# Patient Record
Sex: Male | Born: 1940 | Race: White | Hispanic: No | Smoking: Former smoker
Health system: Southern US, Community
[De-identification: ages and names within clinical notes are randomized; demographics above are authoritative.]

## PROBLEM LIST (undated history)

## (undated) DIAGNOSIS — N400 Enlarged prostate without lower urinary tract symptoms: Secondary | ICD-10-CM

## (undated) DIAGNOSIS — H269 Unspecified cataract: Secondary | ICD-10-CM

## (undated) DIAGNOSIS — R319 Hematuria, unspecified: Secondary | ICD-10-CM

## (undated) DIAGNOSIS — E785 Hyperlipidemia, unspecified: Secondary | ICD-10-CM

## (undated) DIAGNOSIS — C4492 Squamous cell carcinoma of skin, unspecified: Secondary | ICD-10-CM

## (undated) DIAGNOSIS — W57XXXA Bitten or stung by nonvenomous insect and other nonvenomous arthropods, initial encounter: Secondary | ICD-10-CM

## (undated) DIAGNOSIS — K5900 Constipation, unspecified: Secondary | ICD-10-CM

## (undated) DIAGNOSIS — Z0000 Encounter for general adult medical examination without abnormal findings: Secondary | ICD-10-CM

## (undated) DIAGNOSIS — C44310 Basal cell carcinoma of skin of unspecified parts of face: Principal | ICD-10-CM

## (undated) DIAGNOSIS — R972 Elevated prostate specific antigen [PSA]: Secondary | ICD-10-CM

## (undated) HISTORY — DX: Unspecified cataract: H26.9

## (undated) HISTORY — DX: Bitten or stung by nonvenomous insect and other nonvenomous arthropods, initial encounter: W57.XXXA

## (undated) HISTORY — DX: Squamous cell carcinoma of skin, unspecified: C44.92

## (undated) HISTORY — DX: Elevated prostate specific antigen (PSA): R97.20

## (undated) HISTORY — DX: Constipation, unspecified: K59.00

## (undated) HISTORY — DX: Benign prostatic hyperplasia without lower urinary tract symptoms: N40.0

## (undated) HISTORY — DX: Hyperlipidemia, unspecified: E78.5

## (undated) HISTORY — DX: Encounter for general adult medical examination without abnormal findings: Z00.00

## (undated) HISTORY — DX: Hematuria, unspecified: R31.9

## (undated) HISTORY — DX: Basal cell carcinoma of skin of unspecified parts of face: C44.310

---

## 2001-12-11 ENCOUNTER — Encounter: Payer: Self-pay | Admitting: Orthopedic Surgery

## 2001-12-11 ENCOUNTER — Emergency Department (HOSPITAL_COMMUNITY): Admission: EM | Admit: 2001-12-11 | Discharge: 2001-12-11 | Payer: Self-pay | Admitting: Emergency Medicine

## 2005-12-07 DIAGNOSIS — W57XXXA Bitten or stung by nonvenomous insect and other nonvenomous arthropods, initial encounter: Secondary | ICD-10-CM

## 2005-12-07 HISTORY — DX: Bitten or stung by nonvenomous insect and other nonvenomous arthropods, initial encounter: W57.XXXA

## 2012-08-11 ENCOUNTER — Encounter (HOSPITAL_BASED_OUTPATIENT_CLINIC_OR_DEPARTMENT_OTHER): Payer: Self-pay | Admitting: *Deleted

## 2012-08-11 ENCOUNTER — Emergency Department (HOSPITAL_BASED_OUTPATIENT_CLINIC_OR_DEPARTMENT_OTHER)
Admission: EM | Admit: 2012-08-11 | Discharge: 2012-08-11 | Disposition: A | Discharge status: Home / Self Care | Payer: Medicare Other | Attending: Emergency Medicine | Admitting: Emergency Medicine

## 2012-08-11 DIAGNOSIS — Z87891 Personal history of nicotine dependence: Secondary | ICD-10-CM | POA: Insufficient documentation

## 2012-08-11 DIAGNOSIS — R002 Palpitations: Secondary | ICD-10-CM | POA: Insufficient documentation

## 2012-08-11 NOTE — ED Notes (Signed)
Pt declines w/c, amb to room 9 with quick steady gait smiling in nad. Pt states that his pulse rate usually is in the 60's, and over the last several days his home monitor has shown it to be in the low 90's. Pt denies any c/o, states that he is concerned about his pulse rate being higher than usual. ekg performed while pt being triaged.

## 2012-08-11 NOTE — ED Provider Notes (Signed)
History     CSN: [id number]  Arrival date & time 08/11/12  1014   First MD Initiated Contact with Patient 08/11/12 1108      Chief Complaint  Patient presents with  . pulse in 90's last night      HPI Elevated heart rate  Onset - yesterday Course - improving Worsened by - nothing Improved by - nothing Associated symptoms - none  Pt denies cp/sob/headache/weakness/dizziness/syncope/diarrhea.  No diaphoresis reported No new meds (only takes vitamins) No excessive use of coffee, no smoking reported  He reports that he noted his HR was in 90s.  Denies pain.  Denies weakness.  He reports his SBP was 110   He otherwise feels well, no weakness or SOB with exertion  He reports he may have had food poisoning earlier this week after eating a microwaveable meal (he vomited) but that has resolved and he feels improved  PMH -none  History reviewed. No pertinent past surgical history.  History reviewed. No pertinent family history.  History  Substance Use Topics  . Smoking status: Former Games developer  . Smokeless tobacco: Not on file  . Alcohol Use:       Review of Systems  Constitutional: Negative for fever.  Respiratory: Negative for shortness of breath.   Cardiovascular: Negative for chest pain.  Skin: Negative for color change.  Neurological: Negative for weakness.  All other systems reviewed and are negative.    Allergies  Review of patient's allergies indicates no known allergies.  Home Medications   Current Outpatient Rx  Name Route Sig Dispense Refill  . ASPIRIN 81 MG PO TABS Oral Take 81 mg by mouth daily.      BP 120/74  Pulse 79  Temp 98 F (36.7 C) (Oral)  Resp 20  SpO2 99%  Physical Exam CONSTITUTIONAL: Well developed/well nourished HEAD AND FACE: Normocephalic/atraumatic EYES: EOMI/PERRL ENMT: Mucous membranes moist NECK: supple no meningeal signs SPINE:entire spine nontender CV: S1/S2 noted, no murmurs/rubs/gallops noted LUNGS: Lungs are  clear to auscultation bilaterally, no apparent distress ABDOMEN: soft, nontender, no rebound or guarding GU:no cva tenderness NEURO: Pt is awake/alert, moves all extremitiesx4 EXTREMITIES: pulses normal, full ROM SKIN: warm, color normal PSYCH: no abnormalities of mood noted  ED Course  Procedures   1. Palpitation    Pt well appearing, no distress, HR normal here, EKG unremarkable.  I doubt ACS at this time.  He reported recent vomiting, but reports improvement and his abdmen is benign.  Referred to PCP (currently without PCP)   MDM  Nursing notes including past medical history and social history reviewed and considered in documentation        Date: 08/11/2012  Rate: 82  Rhythm: normal sinus rhythm  QRS Axis: normal  Intervals: normal  ST/T Wave abnormalities: nonspecific ST changes  Conduction Disutrbances:none  Narrative Interpretation:   Old EKG Reviewed: none available    Joya Gaskins, MD 08/11/12 1159

## 2012-09-05 ENCOUNTER — Encounter: Payer: Self-pay | Admitting: Internal Medicine

## 2012-09-05 ENCOUNTER — Ambulatory Visit (INDEPENDENT_AMBULATORY_CARE_PROVIDER_SITE_OTHER): Payer: Medicare Other | Admitting: Internal Medicine

## 2012-09-05 VITALS — BP 122/88 | HR 67 | Temp 98.1°F | Resp 16 | Ht 66.0 in | Wt 150.0 lb

## 2012-09-05 DIAGNOSIS — Z125 Encounter for screening for malignant neoplasm of prostate: Secondary | ICD-10-CM

## 2012-09-05 DIAGNOSIS — R634 Abnormal weight loss: Secondary | ICD-10-CM | POA: Insufficient documentation

## 2012-09-05 DIAGNOSIS — M6250 Muscle wasting and atrophy, not elsewhere classified, unspecified site: Secondary | ICD-10-CM

## 2012-09-05 DIAGNOSIS — G8929 Other chronic pain: Secondary | ICD-10-CM | POA: Insufficient documentation

## 2012-09-05 DIAGNOSIS — M549 Dorsalgia, unspecified: Secondary | ICD-10-CM

## 2012-09-05 DIAGNOSIS — M2550 Pain in unspecified joint: Secondary | ICD-10-CM | POA: Insufficient documentation

## 2012-09-05 DIAGNOSIS — Z23 Encounter for immunization: Secondary | ICD-10-CM

## 2012-09-05 LAB — CBC WITH DIFFERENTIAL/PLATELET
Basophils Absolute: 0.1 10*3/uL (ref 0.0–0.1)
Basophils Relative: 1 % (ref 0–1)
Eosinophils Absolute: 0.3 10*3/uL (ref 0.0–0.7)
Eosinophils Relative: 5 % (ref 0–5)
HCT: 43.7 % (ref 39.0–52.0)
Hemoglobin: 14.8 g/dL (ref 13.0–17.0)
Lymphocytes Relative: 22 % (ref 12–46)
Lymphs Abs: 1.4 10*3/uL (ref 0.7–4.0)
MCH: 30.9 pg (ref 26.0–34.0)
MCHC: 33.9 g/dL (ref 30.0–36.0)
MCV: 91.2 fL (ref 78.0–100.0)
Monocytes Absolute: 0.5 10*3/uL (ref 0.1–1.0)
Monocytes Relative: 7 % (ref 3–12)
Neutro Abs: 4.3 10*3/uL (ref 1.7–7.7)
Neutrophils Relative %: 65 % (ref 43–77)
Platelets: 246 10*3/uL (ref 150–400)
RBC: 4.79 MIL/uL (ref 4.22–5.81)
RDW: 13.4 % (ref 11.5–15.5)
WBC: 6.7 10*3/uL (ref 4.0–10.5)

## 2012-09-05 LAB — BASIC METABOLIC PANEL
BUN: 13 mg/dL (ref 6–23)
CO2: 27 mEq/L (ref 19–32)
Calcium: 9.4 mg/dL (ref 8.4–10.5)
Chloride: 102 mEq/L (ref 96–112)
Creat: 0.68 mg/dL (ref 0.50–1.35)
Glucose, Bld: 85 mg/dL (ref 70–99)
Potassium: 4.2 mEq/L (ref 3.5–5.3)
Sodium: 139 mEq/L (ref 135–145)

## 2012-09-05 LAB — HEPATIC FUNCTION PANEL
ALT: 21 U/L (ref 0–53)
AST: 26 U/L (ref 0–37)
Albumin: 4.5 g/dL (ref 3.5–5.2)
Alkaline Phosphatase: 53 U/L (ref 39–117)
Bilirubin, Direct: 0.2 mg/dL (ref 0.0–0.3)
Indirect Bilirubin: 0.7 mg/dL (ref 0.0–0.9)
Total Bilirubin: 0.9 mg/dL (ref 0.3–1.2)
Total Protein: 7 g/dL (ref 6.0–8.3)

## 2012-09-05 LAB — URIC ACID: Uric Acid, Serum: 4.3 mg/dL (ref 4.0–7.8)

## 2012-09-05 NOTE — Assessment & Plan Note (Signed)
Obtain ESR, uric acid, rheumatoid factor
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
## 2012-09-05 NOTE — Assessment & Plan Note (Signed)
Obtain CBC, Chem-7, liver function tests and TSH. Encouraged to reconsider recommendation for colonoscopy. Aware of recommendation at age 71 for colorectal cancer screening.

## 2012-09-06 ENCOUNTER — Encounter: Payer: Self-pay | Admitting: Internal Medicine

## 2012-09-07 MED ORDER — VITAMIN C PO POWD: 333.0000 mg | Freq: Every day | ORAL | Status: AC

## 2012-09-13 ENCOUNTER — Encounter: Payer: Self-pay | Admitting: Internal Medicine

## 2012-09-15 ENCOUNTER — Encounter: Payer: Self-pay | Admitting: *Deleted

## 2012-11-08 ENCOUNTER — Encounter: Payer: Self-pay | Admitting: Internal Medicine

## 2012-11-08 ENCOUNTER — Ambulatory Visit (INDEPENDENT_AMBULATORY_CARE_PROVIDER_SITE_OTHER): Payer: Medicare Other | Admitting: Internal Medicine

## 2012-11-08 VITALS — BP 142/83 | HR 66 | Temp 97.5°F | Resp 16 | Wt 154.8 lb

## 2012-11-08 DIAGNOSIS — R030 Elevated blood-pressure reading, without diagnosis of hypertension: Secondary | ICD-10-CM

## 2012-11-08 DIAGNOSIS — IMO0001 Reserved for inherently not codable concepts without codable children: Secondary | ICD-10-CM

## 2012-11-08 DIAGNOSIS — R634 Abnormal weight loss: Secondary | ICD-10-CM

## 2012-11-08 DIAGNOSIS — I10 Essential (primary) hypertension: Secondary | ICD-10-CM | POA: Insufficient documentation

## 2012-11-08 NOTE — Patient Instructions (Addendum)
Please schedule fasting labs prior to next visit- lipid- chol screening

## 2012-11-08 NOTE — Assessment & Plan Note (Signed)
Result. Continue to monitor. Laboratory evaluation normal.

## 2012-11-08 NOTE — Assessment & Plan Note (Signed)
Recheck was normotensive. May have a phase shift. Recommend outpatient monitoring and schedule followup.
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
## 2013-02-16 ENCOUNTER — Telehealth: Payer: Self-pay

## 2013-02-16 DIAGNOSIS — Z13220 Encounter for screening for lipoid disorders: Secondary | ICD-10-CM

## 2013-02-16 LAB — LIPID PANEL
HDL: 41 mg/dL (ref >39)
LDL Cholesterol: 119 mg/dL — ABNORMAL HIGH (ref 0–99)
Total CHOL/HDL Ratio: 4.2 Ratio
Triglycerides: 64 mg/dL (ref <150)

## 2013-02-16 NOTE — Telephone Encounter (Signed)
Labs ordered.

## 2013-03-07 ENCOUNTER — Ambulatory Visit (INDEPENDENT_AMBULATORY_CARE_PROVIDER_SITE_OTHER): Payer: Medicare Other | Admitting: Family Medicine

## 2013-03-07 ENCOUNTER — Encounter: Payer: Self-pay | Admitting: Family Medicine

## 2013-03-07 ENCOUNTER — Ambulatory Visit (HOSPITAL_BASED_OUTPATIENT_CLINIC_OR_DEPARTMENT_OTHER)
Admission: RE | Admit: 2013-03-07 | Discharge: 2013-03-07 | Disposition: A | Discharge status: Home / Self Care | Payer: Medicare Other | Source: Ambulatory Visit | Attending: Family Medicine | Admitting: Family Medicine

## 2013-03-07 VITALS — BP 146/80 | HR 71 | Temp 97.3°F | Ht 66.0 in | Wt 151.0 lb

## 2013-03-07 DIAGNOSIS — M439 Deforming dorsopathy, unspecified: Secondary | ICD-10-CM | POA: Insufficient documentation

## 2013-03-07 DIAGNOSIS — IMO0001 Reserved for inherently not codable concepts without codable children: Secondary | ICD-10-CM

## 2013-03-07 DIAGNOSIS — G8929 Other chronic pain: Secondary | ICD-10-CM

## 2013-03-07 DIAGNOSIS — R062 Wheezing: Secondary | ICD-10-CM | POA: Insufficient documentation

## 2013-03-07 DIAGNOSIS — S22000D Wedge compression fracture of unspecified thoracic vertebra, subsequent encounter for fracture with routine healing: Secondary | ICD-10-CM

## 2013-03-07 DIAGNOSIS — M2550 Pain in unspecified joint: Secondary | ICD-10-CM

## 2013-03-07 DIAGNOSIS — M549 Dorsalgia, unspecified: Secondary | ICD-10-CM | POA: Insufficient documentation

## 2013-03-07 DIAGNOSIS — R05 Cough: Secondary | ICD-10-CM | POA: Insufficient documentation

## 2013-03-07 DIAGNOSIS — R030 Elevated blood-pressure reading, without diagnosis of hypertension: Secondary | ICD-10-CM

## 2013-03-07 DIAGNOSIS — IMO0002 Reserved for concepts with insufficient information to code with codable children: Secondary | ICD-10-CM

## 2013-03-07 DIAGNOSIS — R059 Cough, unspecified: Secondary | ICD-10-CM | POA: Insufficient documentation

## 2013-03-07 DIAGNOSIS — R634 Abnormal weight loss: Secondary | ICD-10-CM

## 2013-03-07 DIAGNOSIS — K5900 Constipation, unspecified: Secondary | ICD-10-CM

## 2013-03-07 NOTE — Patient Instructions (Addendum)
Digestive Advantage probiotic daily  MegaRed caps krill oil daily Metamucil consider daily   Cholesterol Cholesterol is a white, waxy, fat-like protein needed by your body in small amounts. The liver makes all the cholesterol you need. It is carried from the liver by the blood through the blood vessels. Deposits (plaque) may build up on blood vessel walls. This makes the arteries narrower and stiffer. Plaque increases the risk for heart attack and stroke. You cannot feel your cholesterol level even if it is very high. The only way to know is by a blood test to check your lipid (fats) levels. Once you know your cholesterol levels, you should keep a record of the test results. Work with your caregiver to to keep your levels in the desired range. WHAT THE RESULTS MEAN:  Total cholesterol is a rough measure of all the cholesterol in your blood.  LDL is the so-called bad cholesterol. This is the type that deposits cholesterol in the walls of the arteries. You want this level to be low.  HDL is the good cholesterol because it cleans the arteries and carries the LDL away. You want this level to be high.  Triglycerides are fat that the body can either burn for energy or store. High levels are closely linked to heart disease. DESIRED LEVELS:  Total cholesterol below 200.  LDL below 100 for people at risk, below 70 for very high risk.  HDL above 50 is good, above 60 is best.  Triglycerides below 150. HOW TO LOWER YOUR CHOLESTEROL:  Diet.  Choose fish or white meat chicken and [place], roasted or baked. Limit fatty cuts of red meat, fried foods, and processed meats, such as sausage and lunch meat.  Eat lots of fresh fruits and vegetables. Choose whole grains, beans, pasta, potatoes and cereals.  Use only small amounts of olive, corn or canola oils. Avoid butter, mayonnaise, shortening or palm kernel oils. Avoid foods with trans-fats.  Use skim/nonfat milk and low-fat/nonfat yogurt and  cheeses. Avoid whole milk, cream, ice cream, egg yolks and cheeses. Healthy desserts include angel food cake, ginger snaps, animal crackers, hard candy, popsicles, and low-fat/nonfat frozen yogurt. Avoid pastries, cakes, pies and cookies.  Exercise.  A regular program helps decrease LDL and raises HDL.  Helps with weight control.  Do things that increase your activity level like gardening, walking, or taking the stairs.  Medication.  May be prescribed by your caregiver to help lowering cholesterol and the risk for heart disease.  You may need medicine even if your levels are normal if you have several risk factors. HOME CARE INSTRUCTIONS   Follow your diet and exercise programs as suggested by your caregiver.  Take medications as directed.  Have blood work done when your caregiver feels it is necessary. MAKE SURE YOU:   Understand these instructions.  Will watch your condition.  Will get help right away if you are not doing well or get worse. Document Released: 08/18/2001 Document Revised: 02/15/2012 Document Reviewed: 02/08/2008 [facility] Patient Information 2013 [place], [place].

## 2013-03-08 ENCOUNTER — Encounter: Payer: Self-pay | Admitting: Family Medicine

## 2013-03-08 DIAGNOSIS — K5900 Constipation, unspecified: Secondary | ICD-10-CM

## 2013-03-08 HISTORY — DX: Constipation, unspecified: K59.00
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
## 2013-03-08 NOTE — Assessment & Plan Note (Signed)
Encouraged probiotics, fiber supplement, increased hydration and consider stool softener if no response.

## 2013-03-08 NOTE — Assessment & Plan Note (Signed)
B/l biceps injuries in past, finds his shoulder pain tolerable, adequate ROM no need for further consideration at this time. He will notify us if worsens

## 2013-03-08 NOTE — Assessment & Plan Note (Signed)
Is noting worsening back pain and weakness in legs, xray reveals compression fracture at T11, is referred to neurosurgery for further consideration

## 2013-03-08 NOTE — Assessment & Plan Note (Signed)
Mild elevation, encouraged DASH diet. Continue to monitor

## 2013-03-08 NOTE — Assessment & Plan Note (Signed)
Stable

## 2013-03-09 NOTE — Progress Notes (Signed)
Quick Note:  Patient Informed and voiced understanding ______ 

## 2013-03-17 ENCOUNTER — Encounter: Payer: Self-pay | Admitting: Family Medicine

## 2013-04-11 ENCOUNTER — Other Ambulatory Visit (HOSPITAL_BASED_OUTPATIENT_CLINIC_OR_DEPARTMENT_OTHER): Payer: Self-pay | Admitting: Neurosurgery

## 2013-04-11 DIAGNOSIS — G959 Disease of spinal cord, unspecified: Secondary | ICD-10-CM

## 2013-04-15 ENCOUNTER — Other Ambulatory Visit (HOSPITAL_BASED_OUTPATIENT_CLINIC_OR_DEPARTMENT_OTHER): Payer: Medicare Other

## 2013-04-18 ENCOUNTER — Other Ambulatory Visit (HOSPITAL_BASED_OUTPATIENT_CLINIC_OR_DEPARTMENT_OTHER): Payer: Medicare Other

## 2013-09-12 ENCOUNTER — Ambulatory Visit (INDEPENDENT_AMBULATORY_CARE_PROVIDER_SITE_OTHER): Payer: Medicare Other | Admitting: Family Medicine

## 2013-09-12 ENCOUNTER — Encounter: Payer: Self-pay | Admitting: Family Medicine

## 2013-09-12 VITALS — BP 144/90 | HR 72 | Temp 98.3°F | Ht 66.0 in | Wt 154.0 lb

## 2013-09-12 DIAGNOSIS — R030 Elevated blood-pressure reading, without diagnosis of hypertension: Secondary | ICD-10-CM

## 2013-09-12 DIAGNOSIS — K5900 Constipation, unspecified: Secondary | ICD-10-CM

## 2013-09-12 DIAGNOSIS — R350 Frequency of micturition: Secondary | ICD-10-CM

## 2013-09-12 DIAGNOSIS — R634 Abnormal weight loss: Secondary | ICD-10-CM

## 2013-09-12 DIAGNOSIS — IMO0001 Reserved for inherently not codable concepts without codable children: Secondary | ICD-10-CM

## 2013-09-12 DIAGNOSIS — M549 Dorsalgia, unspecified: Secondary | ICD-10-CM

## 2013-09-12 DIAGNOSIS — G8929 Other chronic pain: Secondary | ICD-10-CM

## 2013-09-12 LAB — CBC
HCT: 43.3 % (ref 39.0–52.0)
Hemoglobin: 15 g/dL (ref 13.0–17.0)
RBC: 4.84 MIL/uL (ref 4.22–5.81)
WBC: 7.1 10*3/uL (ref 4.0–10.5)

## 2013-09-12 NOTE — Patient Instructions (Addendum)
Tylenol/Acetaminophen EX 500 mg 1-2 tabs 3 x daily as needed for pain, try dosing each morning routinely for a few   Salon Pas cream or patches they are good for 12 hours
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
## 2013-09-13 LAB — RENAL FUNCTION PANEL
CO2: 29 mEq/L (ref 19–32)
Calcium: 9.2 mg/dL (ref 8.4–10.5)
Glucose, Bld: 86 mg/dL (ref 70–99)
Potassium: 4.4 mEq/L (ref 3.5–5.3)
Sodium: 139 mEq/L (ref 135–145)

## 2013-09-13 LAB — PSA: PSA: 5.03 ng/mL — ABNORMAL HIGH (ref <=4.00)

## 2013-09-13 LAB — HEPATIC FUNCTION PANEL
AST: 29 U/L (ref 0–37)
Albumin: 4.3 g/dL (ref 3.5–5.2)
Alkaline Phosphatase: 51 U/L (ref 39–117)
Bilirubin, Direct: 0.2 mg/dL (ref 0.0–0.3)
Indirect Bilirubin: 1 mg/dL — ABNORMAL HIGH (ref 0.0–0.9)
Total Bilirubin: 1.2 mg/dL (ref 0.3–1.2)
Total Protein: 6.6 g/dL (ref 6.0–8.3)

## 2013-09-13 LAB — LIPID PANEL
Cholesterol: 182 mg/dL (ref 0–200)
HDL: 41 mg/dL (ref >39)
Total CHOL/HDL Ratio: 4.4 Ratio
VLDL: 24 mg/dL (ref 0–40)

## 2013-09-16 NOTE — Assessment & Plan Note (Signed)
encoruaged probiotics, increased fluids and add a fiber supplement

## 2013-09-16 NOTE — Assessment & Plan Note (Signed)
Improved on recheck minimize sodium and caffeine, recheck at next visit.

## 2013-09-16 NOTE — Assessment & Plan Note (Signed)
Improved today eating better

## 2013-09-16 NOTE — Assessment & Plan Note (Signed)
Has undergone recent evaluaiton and has decided his symptoms are stable and tolerable enough to avoid any major interventions. Will notify us if worsens.

## 2013-09-19 ENCOUNTER — Other Ambulatory Visit: Payer: Self-pay | Admitting: Family Medicine

## 2013-09-19 DIAGNOSIS — R972 Elevated prostate specific antigen [PSA]: Secondary | ICD-10-CM

## 2013-09-19 NOTE — Progress Notes (Signed)
Quick Note:  Patient Informed and voiced understanding.  Pt would like to proceed with referral ______

## 2014-03-13 ENCOUNTER — Ambulatory Visit: Payer: Medicare Other | Admitting: Family Medicine

## 2014-04-12 ENCOUNTER — Ambulatory Visit (INDEPENDENT_AMBULATORY_CARE_PROVIDER_SITE_OTHER): Payer: Medicare HMO | Admitting: Family Medicine

## 2014-04-12 ENCOUNTER — Encounter: Payer: Self-pay | Admitting: Family Medicine

## 2014-04-12 VITALS — BP 150/84 | HR 68 | Temp 97.7°F | Ht 66.0 in | Wt 154.0 lb

## 2014-04-12 DIAGNOSIS — R030 Elevated blood-pressure reading, without diagnosis of hypertension: Secondary | ICD-10-CM

## 2014-04-12 DIAGNOSIS — E785 Hyperlipidemia, unspecified: Secondary | ICD-10-CM

## 2014-04-12 DIAGNOSIS — N400 Enlarged prostate without lower urinary tract symptoms: Secondary | ICD-10-CM

## 2014-04-12 DIAGNOSIS — IMO0001 Reserved for inherently not codable concepts without codable children: Secondary | ICD-10-CM

## 2014-04-12 NOTE — Progress Notes (Signed)
Pre visit review using our clinic review tool, if applicable. No additional management support is needed unless otherwise documented below in the visit note. 

## 2014-04-12 NOTE — Patient Instructions (Signed)
DASH Diet  The DASH diet stands for "Dietary Approaches to Stop Hypertension." It is a healthy eating plan that has been shown to reduce high blood pressure (hypertension) in as little as 14 days, while also possibly providing other significant health benefits. These other health benefits include reducing the risk of breast cancer after menopause and reducing the risk of type 2 diabetes, heart disease, colon cancer, and stroke. Health benefits also include weight loss and slowing kidney failure in patients with chronic kidney disease.   DIET GUIDELINES  · Limit salt (sodium). Your diet should contain less than 1500 mg of sodium daily.  · Limit refined or processed carbohydrates. Your diet should include mostly whole grains. Desserts and added sugars should be used sparingly.  · Include small amounts of heart-healthy fats. These types of fats include nuts, oils, and tub margarine. Limit saturated and trans fats. These fats have been shown to be harmful in the body.  CHOOSING FOODS   The following food groups are based on a 2000 calorie diet. See your Registered Dietitian for individual calorie needs.  Grains and Grain Products (6 to 8 servings daily)  · Eat More Often: Whole-wheat bread, brown rice, whole-grain or wheat pasta, quinoa, popcorn without added fat or salt (air popped).  · Eat Less Often: White bread, white pasta, white rice, cornbread.  Vegetables (4 to 5 servings daily)  · Eat More Often: Fresh, frozen, and canned vegetables. Vegetables may be raw, steamed, roasted, or grilled with a minimal amount of fat.  · Eat Less Often/Avoid: Creamed or fried vegetables. Vegetables in a cheese sauce.  Fruit (4 to 5 servings daily)  · Eat More Often: All fresh, canned (in natural juice), or frozen fruits. Dried fruits without added sugar. One hundred percent fruit juice (½ cup [237 mL] daily).  · Eat Less Often: Dried fruits with added sugar. Canned fruit in light or heavy syrup.  Lean Meats, Fish, and Poultry (2  servings or less daily. One serving is 3 to 4 oz [85-114 g]).  · Eat More Often: Ninety percent or leaner ground beef, tenderloin, sirloin. Round cuts of beef, chicken breast, turkey breast. All fish. Grill, bake, or broil your meat. Nothing should be fried.  · Eat Less Often/Avoid: Fatty cuts of meat, turkey, or chicken leg, thigh, or wing. Fried cuts of meat or fish.  Dairy (2 to 3 servings)  · Eat More Often: Low-fat or fat-free milk, low-fat plain or light yogurt, reduced-fat or part-skim cheese.  · Eat Less Often/Avoid: Milk (whole, 2%). Whole milk yogurt. Full-fat cheeses.  Nuts, Seeds, and Legumes (4 to 5 servings per week)  · Eat More Often: All without added salt.  · Eat Less Often/Avoid: Salted nuts and seeds, canned beans with added salt.  Fats and Sweets (limited)  · Eat More Often: Vegetable oils, tub margarines without trans fats, sugar-free gelatin. Mayonnaise and salad dressings.  · Eat Less Often/Avoid: Coconut oils, palm oils, butter, stick margarine, cream, half and half, cookies, candy, pie.  FOR MORE INFORMATION  The Dash Diet Eating Plan: www.dashdiet.org  Document Released: 11/12/2011 Document Revised: 02/15/2012 Document Reviewed: 11/12/2011  [employer]® Patient Information ©2014 [employer].

## 2014-04-15 ENCOUNTER — Encounter: Payer: Self-pay | Admitting: Family Medicine

## 2014-04-15 DIAGNOSIS — N400 Enlarged prostate without lower urinary tract symptoms: Secondary | ICD-10-CM

## 2014-04-15 DIAGNOSIS — E785 Hyperlipidemia, unspecified: Secondary | ICD-10-CM

## 2014-04-15 HISTORY — DX: Benign prostatic hyperplasia without lower urinary tract symptoms: N40.0

## 2014-04-15 HISTORY — DX: Hyperlipidemia, unspecified: E78.5

## 2014-04-15 NOTE — Progress Notes (Signed)
Patient ID: Troy Mccann, male   DOB: 1941/05/19, 73 y.o.   MRN: [id number] Troy Mccann [id number] May 03, 1941 04/15/2014      Progress Note-Follow Up  Subjective  Chief Complaint  Chief Complaint  Patient presents with  . Follow-up    6 month    HPI  Patient is a 73 year old male in today for routine medical care. His blood pressure at home and his systolic is generally 20. His diastolic is 06T to 01S. He denies any recent illness or concerns. Has recently been seen by urology. No urinary complaints. Denies CP/palp/SOB/HA/congestion/fevers/GI or GU c/o. Taking meds as prescribed  Past Medical History  Diagnosis Date  . Tick bite 2007    Never assessed by a physician.  Marland Kitchen Unspecified constipation 03/08/2013  . BPH (benign prostatic hyperplasia) 04/15/2014  . Other and unspecified hyperlipidemia 04/15/2014    History reviewed. No pertinent past surgical history.  Family History  Problem Relation Age of Onset  . Arthritis Mother     rheumatoid  . Heart disease Mother     rheumatic fever  . Hypertension Mother   . Heart disease Father     MI  . Dementia Father   . Hypertension Sister   . Hypertension Son   . Obesity Son   . Aneurysm Maternal Grandmother   . Dementia Maternal Grandfather   . Heart disease Paternal Grandmother     MI  . Kidney disease Paternal Grandfather     bright's disease    History   Social History  . Marital Status: Divorced    Spouse Name: N/A    Number of Children: N/A  . Years of Education: N/A   Occupational History  . Not on file.   Social History Main Topics  . Smoking status: Former Research scientist (life sciences)  . Smokeless tobacco: Not on file  . Alcohol Use: Yes     Comment: rare  . Drug Use: No  . Sexual Activity: Not on file   Other Topics Concern  . Not on file   Social History Narrative  . No narrative on file    Current Outpatient Prescriptions on File Prior to Visit  Medication Sig Dispense Refill  . Ascorbic Acid (VITAMIN C) POWD  Take 333 mg by mouth daily. Emergen-C-take 1/3 packet daily.      . Camphor-Menthol-Methyl Sal (TIGER BALM MUSCLE RUB EX) Apply topically at bedtime.      . Cholecalciferol (VITAMIN D-3) 1000 UNITS CAPS Take by mouth daily.       No current facility-administered medications on file prior to visit.    Allergies  Allergen Reactions  . Yellow Jacket Venom [Bee Venom] Hives    Review of Systems  Review of Systems  Constitutional: Negative for fever and malaise/fatigue.  HENT: Negative for congestion.   Eyes: Negative for discharge.  Respiratory: Negative for shortness of breath.   Cardiovascular: Negative for chest pain, palpitations and leg swelling.  Gastrointestinal: Negative for nausea, abdominal pain and diarrhea.  Genitourinary: Negative for dysuria.  Musculoskeletal: Negative for falls.  Skin: Negative for rash.  Neurological: Negative for loss of consciousness and headaches.  Endo/Heme/Allergies: Negative for polydipsia.  Psychiatric/Behavioral: Negative for depression and suicidal ideas. The patient is not nervous/anxious and does not have insomnia.     Objective  BP 150/84  Pulse 68  Temp(Src) 97.7 F (36.5 C) (Oral)  Ht 5\' 6"  (1.676 m)  Wt 154 lb (69.854 kg)  BMI 24.87 kg/m2  SpO2 91%  Physical  Exam  Physical Exam  Constitutional: He is oriented to person, place, and time and well-developed, well-nourished, and in no distress. No distress.  HENT:  Head: Normocephalic and atraumatic.  Eyes: Conjunctivae are normal.  Neck: Neck supple. No thyromegaly present.  Cardiovascular: Normal rate, regular rhythm and normal heart sounds.   No murmur heard. Pulmonary/Chest: Effort normal and breath sounds normal. No respiratory distress.  Abdominal: He exhibits no distension and no mass. There is no tenderness.  Musculoskeletal: He exhibits no edema.  Neurological: He is alert and oriented to person, place, and time.  Skin: Skin is warm.  Psychiatric: Memory, affect  and judgment normal.    Lab Results  Component Value Date   TSH 2.062 09/12/2013   Lab Results  Component Value Date   WBC 7.1 09/12/2013   HGB 15.0 09/12/2013   HCT 43.3 09/12/2013   MCV 89.5 09/12/2013   PLT 236 09/12/2013   Lab Results  Component Value Date   CREATININE 0.74 09/12/2013   BUN 17 09/12/2013   NA 139 09/12/2013   K 4.4 09/12/2013   CL 104 09/12/2013   CO2 29 09/12/2013   Lab Results  Component Value Date   ALT 25 09/12/2013   AST 29 09/12/2013   ALKPHOS 51 09/12/2013   BILITOT 1.2 09/12/2013   Lab Results  Component Value Date   CHOL 182 09/12/2013   Lab Results  Component Value Date   HDL 41 09/12/2013   Lab Results  Component Value Date   LDLCALC 117* 09/12/2013   Lab Results  Component Value Date   TRIG 122 09/12/2013   Lab Results  Component Value Date   CHOLHDL 4.4 09/12/2013     Assessment & Plan  Elevated blood pressure Adequate control elsewhere and improved on recheckno changes to meds. Encouraged heart healthy diet such as the DASH diet and exercise as tolerated.   BPH (benign prostatic hyperplasia) Asymptomatic at present is following with urology  Other and unspecified hyperlipidemia Encouraged heart healthy diet, increase exercise, avoid trans fats, consider a krill oil cap daily

## 2014-04-15 NOTE — Assessment & Plan Note (Signed)
Encouraged heart healthy diet, increase exercise, avoid trans fats, consider a krill oil cap daily 

## 2014-04-15 NOTE — Assessment & Plan Note (Signed)
Asymptomatic at present is following with urology

## 2014-04-15 NOTE — Assessment & Plan Note (Addendum)
Adequate control elsewhere and improved on recheckno changes to meds. Encouraged heart healthy diet such as the DASH diet and exercise as tolerated.

## 2014-10-15 ENCOUNTER — Encounter: Payer: Self-pay | Admitting: Family Medicine

## 2014-10-15 ENCOUNTER — Ambulatory Visit (INDEPENDENT_AMBULATORY_CARE_PROVIDER_SITE_OTHER): Payer: Medicare HMO | Admitting: Family Medicine

## 2014-10-15 VITALS — BP 124/78 | HR 67 | Temp 97.8°F | Ht 66.0 in | Wt 153.2 lb

## 2014-10-15 DIAGNOSIS — C4492 Squamous cell carcinoma of skin, unspecified: Secondary | ICD-10-CM

## 2014-10-15 DIAGNOSIS — Z23 Encounter for immunization: Secondary | ICD-10-CM

## 2014-10-15 DIAGNOSIS — IMO0001 Reserved for inherently not codable concepts without codable children: Secondary | ICD-10-CM

## 2014-10-15 DIAGNOSIS — R972 Elevated prostate specific antigen [PSA]: Secondary | ICD-10-CM

## 2014-10-15 DIAGNOSIS — K5900 Constipation, unspecified: Secondary | ICD-10-CM

## 2014-10-15 DIAGNOSIS — H547 Unspecified visual loss: Secondary | ICD-10-CM

## 2014-10-15 DIAGNOSIS — N400 Enlarged prostate without lower urinary tract symptoms: Secondary | ICD-10-CM

## 2014-10-15 DIAGNOSIS — R030 Elevated blood-pressure reading, without diagnosis of hypertension: Secondary | ICD-10-CM

## 2014-10-15 DIAGNOSIS — Z0000 Encounter for general adult medical examination without abnormal findings: Secondary | ICD-10-CM

## 2014-10-15 DIAGNOSIS — E785 Hyperlipidemia, unspecified: Secondary | ICD-10-CM

## 2014-10-15 LAB — LIPID PANEL
Cholesterol: 212 mg/dL — ABNORMAL HIGH (ref 0–200)
HDL: 38.9 mg/dL — ABNORMAL LOW (ref >39.00)
LDL Cholesterol: 159 mg/dL — ABNORMAL HIGH (ref 0–99)
NONHDL: 173.1
Total CHOL/HDL Ratio: 5
Triglycerides: 71 mg/dL (ref 0.0–149.0)
VLDL: 14.2 mg/dL (ref 0.0–40.0)

## 2014-10-15 LAB — RENAL FUNCTION PANEL
Albumin: 3.7 g/dL (ref 3.5–5.2)
BUN: 14 mg/dL (ref 6–23)
CALCIUM: 9.1 mg/dL (ref 8.4–10.5)
CO2: 24 meq/L (ref 19–32)
Chloride: 105 mEq/L (ref 96–112)
Creatinine, Ser: 0.7 mg/dL (ref 0.4–1.5)
GFR: 111.11 mL/min (ref >60.00)
Glucose, Bld: 95 mg/dL (ref 70–99)
Phosphorus: 3.1 mg/dL (ref 2.3–4.6)
Potassium: 4.2 mEq/L (ref 3.5–5.1)
SODIUM: 140 meq/L (ref 135–145)

## 2014-10-15 LAB — HEPATIC FUNCTION PANEL
ALT: 30 U/L (ref 0–53)
AST: 33 U/L (ref 0–37)
Albumin: 3.7 g/dL (ref 3.5–5.2)
Alkaline Phosphatase: 45 U/L (ref 39–117)
BILIRUBIN TOTAL: 1.5 mg/dL — AB (ref 0.2–1.2)
Bilirubin, Direct: 0.2 mg/dL (ref 0.0–0.3)
TOTAL PROTEIN: 6.7 g/dL (ref 6.0–8.3)

## 2014-10-15 LAB — CBC
HCT: 44.9 % (ref 39.0–52.0)
HEMOGLOBIN: 14.8 g/dL (ref 13.0–17.0)
MCHC: 32.9 g/dL (ref 30.0–36.0)
MCV: 92.7 fl (ref 78.0–100.0)
Platelets: 205 10*3/uL (ref 150.0–400.0)
RBC: 4.84 Mil/uL (ref 4.22–5.81)
RDW: 13.2 % (ref 11.5–15.5)
WBC: 6.2 10*3/uL (ref 4.0–10.5)

## 2014-10-15 LAB — PSA, MEDICARE: PSA: 4.62 ng/ml — ABNORMAL HIGH (ref 0.10–4.00)

## 2014-10-15 LAB — TSH: TSH: 1.85 u[IU]/mL (ref 0.35–4.50)

## 2014-10-15 NOTE — Patient Instructions (Signed)
Consider an audiology exam at [facility] for hearing evaluation   Preventive Care for Adults A healthy lifestyle and preventive care can promote health and wellness. Preventive health guidelines for men include the following key practices:  A routine yearly physical is a good way to check with your health care provider about your health and preventative screening. It is a chance to share any concerns and updates on your health and to receive a thorough exam.  Visit your dentist for a routine exam and preventative care every 6 months. Brush your teeth twice a day and floss once a day. Good oral hygiene prevents tooth decay and gum disease.  The frequency of eye exams is based on your age, health, family medical history, use of contact lenses, and other factors. Follow your health care provider's recommendations for frequency of eye exams.  Eat a healthy diet. Foods such as vegetables, fruits, whole grains, low-fat dairy products, and lean protein foods contain the nutrients you need without too many calories. Decrease your intake of foods high in solid fats, added sugars, and salt. Eat the right amount of calories for you.Get information about a proper diet from your health care provider, if necessary.  Regular physical exercise is one of the most important things you can do for your health. Most adults should get at least 150 minutes of moderate-intensity exercise (any activity that increases your heart rate and causes you to sweat) each week. In addition, most adults need muscle-strengthening exercises on 2 or more days a week.  Maintain a healthy weight. The body mass index (BMI) is a screening tool to identify possible weight problems. It provides an estimate of body fat based on height and weight. Your health care provider can find your BMI and can help you achieve or maintain a healthy weight.For adults 20 years and older:  A BMI below 18.5 is considered underweight.  A BMI of 18.5 to 24.9 is  normal.  A BMI of 25 to 29.9 is considered overweight.  A BMI of 30 and above is considered obese.  Maintain normal blood lipids and cholesterol levels by exercising and minimizing your intake of saturated fat. Eat a balanced diet with plenty of fruit and vegetables. Blood tests for lipids and cholesterol should begin at age 11 and be repeated every 5 years. If your lipid or cholesterol levels are high, you are over 50, or you are at high risk for heart disease, you may need your cholesterol levels checked more frequently.Ongoing high lipid and cholesterol levels should be treated with medicines if diet and exercise are not working.  If you smoke, find out from your health care provider how to quit. If you do not use tobacco, do not start.  Lung cancer screening is recommended for adults aged 69-80 years who are at high risk for developing lung cancer because of a history of smoking. A yearly low-dose CT scan of the lungs is recommended for people who have at least a 30-pack-year history of smoking and are a current smoker or have quit within the past 15 years. A pack year of smoking is smoking an average of 1 pack of cigarettes a day for 1 year (for example: 1 pack a day for 30 years or 2 packs a day for 15 years). Yearly screening should continue until the smoker has stopped smoking for at least 15 years. Yearly screening should be stopped for people who develop a health problem that would prevent them from having lung cancer treatment.  If you choose to drink alcohol, do not have more than 2 drinks per day. One drink is considered to be 12 ounces (355 mL) of beer, 5 ounces (148 mL) of wine, or 1.5 ounces (44 mL) of liquor.  Avoid use of street drugs. Do not share needles with anyone. Ask for help if you need support or instructions about stopping the use of drugs.  High blood pressure causes heart disease and increases the risk of stroke. Your blood pressure should be checked at least every 1-2  years. Ongoing high blood pressure should be treated with medicines, if weight loss and exercise are not effective.  If you are 20-35 years old, ask your health care provider if you should take aspirin to prevent heart disease.  Diabetes screening involves taking a blood sample to check your fasting blood sugar level. This should be done once every 3 years, after age 78, if you are within normal weight and without risk factors for diabetes. Testing should be considered at a younger age or be carried out more frequently if you are overweight and have at least 1 risk factor for diabetes.  Colorectal cancer can be detected and often prevented. Most routine colorectal cancer screening begins at the age of 4 and continues through age 60. However, your health care provider may recommend screening at an earlier age if you have risk factors for colon cancer. On a yearly basis, your health care provider may provide home test kits to check for hidden blood in the stool. Use of a small camera at the end of a tube to directly examine the colon (sigmoidoscopy or colonoscopy) can detect the earliest forms of colorectal cancer. Talk to your health care provider about this at age 36, when routine screening begins. Direct exam of the colon should be repeated every 5-10 years through age 55, unless early forms of precancerous polyps or small growths are found.  People who are at an increased risk for hepatitis B should be screened for this virus. You are considered at high risk for hepatitis B if:  You were born in a country where hepatitis B occurs often. Talk with your health care provider about which countries are considered high risk.  Your parents were born in a high-risk country and you have not received a shot to protect against hepatitis B (hepatitis B vaccine).  You have HIV or AIDS.  You use needles to inject street drugs.  You live with, or have sex with, someone who has hepatitis B.  You are a man who  has sex with other men (MSM).  You get hemodialysis treatment.  You take certain medicines for conditions such as cancer, organ transplantation, and autoimmune conditions.  Hepatitis C blood testing is recommended for all people born from [birth year] through 1965 and any individual with known risks for hepatitis C.  Practice safe sex. Use condoms and avoid high-risk sexual practices to reduce the spread of sexually transmitted infections (STIs). STIs include gonorrhea, chlamydia, syphilis, trichomonas, herpes, HPV, and human immunodeficiency virus (HIV). Herpes, HIV, and HPV are viral illnesses that have no cure. They can result in disability, cancer, and death.  If you are at risk of being infected with HIV, it is recommended that you take a prescription medicine daily to prevent HIV infection. This is called preexposure prophylaxis (PrEP). You are considered at risk if:  You are a man who has sex with other men (MSM) and have other risk factors.  You are a heterosexual man,  are sexually active, and are at increased risk for HIV infection.  You take drugs by injection.  You are sexually active with a partner who has HIV.  Talk with your health care provider about whether you are at high risk of being infected with HIV. If you choose to begin PrEP, you should first be tested for HIV. You should then be tested every 3 months for as long as you are taking PrEP.  A one-time screening for abdominal aortic aneurysm (AAA) and surgical repair of large AAAs by ultrasound are recommended for men ages 85 to 72 years who are current or former smokers.  Healthy men should no longer receive prostate-specific antigen (PSA) blood tests as part of routine cancer screening. Talk with your health care provider about prostate cancer screening.  Testicular cancer screening is not recommended for adult males who have no symptoms. Screening includes self-exam, a health care provider exam, and other screening tests.  Consult with your health care provider about any symptoms you have or any concerns you have about testicular cancer.  Use sunscreen. Apply sunscreen liberally and repeatedly throughout the day. You should seek shade when your shadow is shorter than you. Protect yourself by wearing long sleeves, pants, a wide-brimmed hat, and sunglasses year round, whenever you are outdoors.  Once a month, do a whole-body skin exam, using a mirror to look at the skin on your back. Tell your health care provider about new moles, moles that have irregular borders, moles that are larger than a pencil eraser, or moles that have changed in shape or color.  Stay current with required vaccines (immunizations).  Influenza vaccine. All adults should be immunized every year.  Tetanus, diphtheria, and acellular pertussis (Td, Tdap) vaccine. An adult who has not previously received Tdap or who does not know his vaccine status should receive 1 dose of Tdap. This initial dose should be followed by tetanus and diphtheria toxoids (Td) booster doses every 10 years. Adults with an unknown or incomplete history of completing a 3-dose immunization series with Td-containing vaccines should begin or complete a primary immunization series including a Tdap dose. Adults should receive a Td booster every 10 years.  Varicella vaccine. An adult without evidence of immunity to varicella should receive 2 doses or a second dose if he has previously received 1 dose.  Human papillomavirus (HPV) vaccine. Males aged 76-21 years who have not received the vaccine previously should receive the 3-dose series. Males aged 22-26 years may be immunized. Immunization is recommended through the age of 87 years for any male who has sex with males and did not get any or all doses earlier. Immunization is recommended for any person with an immunocompromised condition through the age of 21 years if he did not get any or all doses earlier. During the 3-dose series, the  second dose should be obtained 4-8 weeks after the first dose. The third dose should be obtained 24 weeks after the first dose and 16 weeks after the second dose.  Zoster vaccine. One dose is recommended for adults aged 36 years or older unless certain conditions are present.  Measles, mumps, and rubella (MMR) vaccine. Adults born before [birth year] generally are considered immune to measles and mumps. Adults born in [birth year] or later should have 1 or more doses of MMR vaccine unless there is a contraindication to the vaccine or there is laboratory evidence of immunity to each of the three diseases. A routine second dose of MMR vaccine should be obtained  at least 28 days after the first dose for students attending postsecondary schools, health care workers, or international travelers. People who received inactivated measles vaccine or an unknown type of measles vaccine during 1963-1967 should receive 2 doses of MMR vaccine. People who received inactivated mumps vaccine or an unknown type of mumps vaccine before 1979 and are at high risk for mumps infection should consider immunization with 2 doses of MMR vaccine. Unvaccinated health care workers born before [birth year] who lack laboratory evidence of measles, mumps, or rubella immunity or laboratory confirmation of disease should consider measles and mumps immunization with 2 doses of MMR vaccine or rubella immunization with 1 dose of MMR vaccine.  Pneumococcal 13-valent conjugate (PCV13) vaccine. When indicated, a person who is uncertain of his immunization history and has no record of immunization should receive the PCV13 vaccine. An adult aged 62 years or older who has certain medical conditions and has not been previously immunized should receive 1 dose of PCV13 vaccine. This PCV13 should be followed with a dose of pneumococcal polysaccharide (PPSV23) vaccine. The PPSV23 vaccine dose should be obtained at least 8 weeks after the dose of PCV13 vaccine. An adult aged 61 years  or older who has certain medical conditions and previously received 1 or more doses of PPSV23 vaccine should receive 1 dose of PCV13. The PCV13 vaccine dose should be obtained 1 or more years after the last PPSV23 vaccine dose.  Pneumococcal polysaccharide (PPSV23) vaccine. When PCV13 is also indicated, PCV13 should be obtained first. All adults aged 9 years and older should be immunized. An adult younger than age 49 years who has certain medical conditions should be immunized. Any person who resides in a nursing home or long-term care facility should be immunized. An adult smoker should be immunized. People with an immunocompromised condition and certain other conditions should receive both PCV13 and PPSV23 vaccines. People with human immunodeficiency virus (HIV) infection should be immunized as soon as possible after diagnosis. Immunization during chemotherapy or radiation therapy should be avoided. Routine use of PPSV23 vaccine is not recommended for American Indians, [place] Natives, or people younger than 65 years unless there are medical conditions that require PPSV23 vaccine. When indicated, people who have unknown immunization and have no record of immunization should receive PPSV23 vaccine. One-time revaccination 5 years after the first dose of PPSV23 is recommended for people aged 19-64 years who have chronic kidney failure, nephrotic syndrome, asplenia, or immunocompromised conditions. People who received 1-2 doses of PPSV23 before age 26 years should receive another dose of PPSV23 vaccine at age 39 years or later if at least 5 years have passed since the previous dose. Doses of PPSV23 are not needed for people immunized with PPSV23 at or after age 36 years.  Meningococcal vaccine. Adults with asplenia or persistent complement component deficiencies should receive 2 doses of quadrivalent meningococcal conjugate (MenACWY-D) vaccine. The doses should be obtained at least 2 months apart. Microbiologists  working with certain meningococcal bacteria, [employer] recruits, people at risk during an outbreak, and people who travel to or live in countries with a high rate of meningitis should be immunized. A first-year college student up through age 70 years who is living in a residence hall should receive a dose if he did not receive a dose on or after his 16th birthday. Adults who have certain high-risk conditions should receive one or more doses of vaccine.  Hepatitis A vaccine. Adults who wish to be protected from this disease, have certain high-risk conditions,  work with hepatitis A-infected animals, work in hepatitis A research labs, or travel to or work in countries with a high rate of hepatitis A should be immunized. Adults who were previously unvaccinated and who anticipate close contact with an international adoptee during the first 60 days after arrival in the [place] from a country with a high rate of hepatitis A should be immunized.  Hepatitis B vaccine. Adults should be immunized if they wish to be protected from this disease, have certain high-risk conditions, may be exposed to blood or other infectious body fluids, are household contacts or sex partners of hepatitis B positive people, are clients or workers in certain care facilities, or travel to or work in countries with a high rate of hepatitis B.  Haemophilus influenzae type b (Hib) vaccine. A previously unvaccinated person with asplenia or sickle cell disease or having a scheduled splenectomy should receive 1 dose of Hib vaccine. Regardless of previous immunization, a recipient of a hematopoietic stem cell transplant should receive a 3-dose series 6-12 months after his successful transplant. Hib vaccine is not recommended for adults with HIV infection. Preventive Service / Frequency Ages 25 to 65  Blood pressure check.** / Every 1 to 2 years.  Lipid and cholesterol check.** / Every 5 years beginning at age 84.  Hepatitis C blood  test.** / For any individual with known risks for hepatitis C.  Skin self-exam. / Monthly.  Influenza vaccine. / Every year.  Tetanus, diphtheria, and acellular pertussis (Tdap, Td) vaccine.** / Consult your health care provider. 1 dose of Td every 10 years.  Varicella vaccine.** / Consult your health care provider.  HPV vaccine. / 3 doses over 6 months, if 48 or younger.  Measles, mumps, rubella (MMR) vaccine.** / You need at least 1 dose of MMR if you were born in 1957 or later. You may also need a second dose.  Pneumococcal 13-valent conjugate (PCV13) vaccine.** / Consult your health care provider.  Pneumococcal polysaccharide (PPSV23) vaccine.** / 1 to 2 doses if you smoke cigarettes or if you have certain conditions.  Meningococcal vaccine.** / 1 dose if you are age 77 to 71 years and a Market researcher living in a residence hall, or have one of several medical conditions. You may also need additional booster doses.  Hepatitis A vaccine.** / Consult your health care provider.  Hepatitis B vaccine.** / Consult your health care provider.  Haemophilus influenzae type b (Hib) vaccine.** / Consult your health care provider. Ages 46 to 75  Blood pressure check.** / Every 1 to 2 years.  Lipid and cholesterol check.** / Every 5 years beginning at age 80.  Lung cancer screening. / Every year if you are aged 48-80 years and have a 30-pack-year history of smoking and currently smoke or have quit within the past 15 years. Yearly screening is stopped once you have quit smoking for at least 15 years or develop a health problem that would prevent you from having lung cancer treatment.  Fecal occult blood test (FOBT) of stool. / Every year beginning at age 77 and continuing until age 18. You may not have to do this test if you get a colonoscopy every 10 years.  Flexible sigmoidoscopy** or colonoscopy.** / Every 5 years for a flexible sigmoidoscopy or every 10 years for a colonoscopy  beginning at age 87 and continuing until age 82.  Hepatitis C blood test.** / For all people born from [birth year] through 1967 and any individual with known risks for  hepatitis C.  Skin self-exam. / Monthly.  Influenza vaccine. / Every year.  Tetanus, diphtheria, and acellular pertussis (Tdap/Td) vaccine.** / Consult your health care provider. 1 dose of Td every 10 years.  Varicella vaccine.** / Consult your health care provider.  Zoster vaccine.** / 1 dose for adults aged 42 years or older.  Measles, mumps, rubella (MMR) vaccine.** / You need at least 1 dose of MMR if you were born in 1959 or later. You may also need a second dose.  Pneumococcal 13-valent conjugate (PCV13) vaccine.** / Consult your health care provider.  Pneumococcal polysaccharide (PPSV23) vaccine.** / 1 to 2 doses if you smoke cigarettes or if you have certain conditions.  Meningococcal vaccine.** / Consult your health care provider.  Hepatitis A vaccine.** / Consult your health care provider.  Hepatitis B vaccine.** / Consult your health care provider.  Haemophilus influenzae type b (Hib) vaccine.** / Consult your health care provider. Ages 71 and over  Blood pressure check.** / Every 1 to 2 years.  Lipid and cholesterol check.**/ Every 5 years beginning at age 56.  Lung cancer screening. / Every year if you are aged 63-80 years and have a 30-pack-year history of smoking and currently smoke or have quit within the past 15 years. Yearly screening is stopped once you have quit smoking for at least 15 years or develop a health problem that would prevent you from having lung cancer treatment.  Fecal occult blood test (FOBT) of stool. / Every year beginning at age 59 and continuing until age 49. You may not have to do this test if you get a colonoscopy every 10 years.  Flexible sigmoidoscopy** or colonoscopy.** / Every 5 years for a flexible sigmoidoscopy or every 10 years for a colonoscopy beginning at age 85 and  continuing until age 25.  Hepatitis C blood test.** / For all people born from [birth year] through 1967 and any individual with known risks for hepatitis C.  Abdominal aortic aneurysm (AAA) screening.** / A one-time screening for ages 76 to 30 years who are current or former smokers.  Skin self-exam. / Monthly.  Influenza vaccine. / Every year.  Tetanus, diphtheria, and acellular pertussis (Tdap/Td) vaccine.** / 1 dose of Td every 10 years.  Varicella vaccine.** / Consult your health care provider.  Zoster vaccine.** / 1 dose for adults aged 43 years or older.  Pneumococcal 13-valent conjugate (PCV13) vaccine.** / Consult your health care provider.  Pneumococcal polysaccharide (PPSV23) vaccine.** / 1 dose for all adults aged 42 years and older.  Meningococcal vaccine.** / Consult your health care provider.  Hepatitis A vaccine.** / Consult your health care provider.  Hepatitis B vaccine.** / Consult your health care provider.  Haemophilus influenzae type b (Hib) vaccine.** / Consult your health care provider. **Family history and personal history of risk and conditions may change your health care provider's recommendations. Document Released: 01/19/2002 Document Revised: 11/28/2013 Document Reviewed: 04/20/2011 [facility] Patient Information 2015 [place], [place]. This information is not intended to replace advice given to you by your health care provider. Make sure you discuss any questions you have with your health care provider.

## 2014-10-15 NOTE — Progress Notes (Signed)
Pre visit review using our clinic review tool, if applicable. No additional management support is needed unless otherwise documented below in the visit note. 

## 2014-10-21 ENCOUNTER — Encounter: Payer: Self-pay | Admitting: Family Medicine

## 2014-10-21 DIAGNOSIS — Z0000 Encounter for general adult medical examination without abnormal findings: Secondary | ICD-10-CM

## 2014-10-21 DIAGNOSIS — C4492 Squamous cell carcinoma of skin, unspecified: Secondary | ICD-10-CM

## 2014-10-21 HISTORY — DX: Encounter for general adult medical examination without abnormal findings: Z00.00

## 2014-10-21 HISTORY — DX: Squamous cell carcinoma of skin, unspecified: C44.92

## 2014-10-21 NOTE — Assessment & Plan Note (Signed)
Encouraged heart healthy diet, increase exercise, avoid trans fats, consider a krill oil cap daily 

## 2014-10-21 NOTE — Progress Notes (Signed)
Troy Mccann  [id number] 1941-10-12 10/21/2014      Progress Note-Follow Up  Subjective  Chief Complaint  Chief Complaint  Patient presents with  . Annual Exam    medicare wellness  . Injections    prevnar    HPI  Patient is a 73 y.o. male in today for routine medical care. He is in today for annual exam. No recent illness. He feels well. He feels confident the probiotics have improved his constipation. He was seen by urology for elevated PSA. No new urinary symptoms. Last PSA was 3.88 per patient. He continues to stay active and eat a heart healthy diet. Denies CP/palp/SOB/HA/congestion/fevers/GI or GU c/o. Taking meds as prescribed  Past Medical History  Diagnosis Date  . Tick bite 2007    Never assessed by a physician.  Marland Kitchen Unspecified constipation 03/08/2013  . BPH (benign prostatic hyperplasia) 04/15/2014  . Other and unspecified hyperlipidemia 04/15/2014  . Medicare annual wellness visit, subsequent 10/21/2014  . SCC (squamous cell carcinoma) 10/21/2014    History reviewed. No pertinent past surgical history.  Family History  Problem Relation Age of Onset  . Arthritis Mother     rheumatoid  . Heart disease Mother     rheumatic fever  . Hypertension Mother   . Heart disease Father     MI  . Dementia Father   . Hypertension Sister   . Hypertension Son   . Obesity Son   . Aneurysm Maternal Grandmother   . Dementia Maternal Grandfather   . Heart disease Paternal Grandmother     MI  . Kidney disease Paternal Grandfather     bright's disease    History   Social History  . Marital Status: Divorced    Spouse Name: N/A    Number of Children: N/A  . Years of Education: N/A   Occupational History  . Not on file.   Social History Main Topics  . Smoking status: Former Research scientist (life sciences)  . Smokeless tobacco: Not on file  . Alcohol Use: Yes     Comment: rare  . Drug Use: No  . Sexual Activity: Not on file   Other Topics Concern  . Not on file   Social History  Narrative    Current Outpatient Prescriptions on File Prior to Visit  Medication Sig Dispense Refill  . Ascorbic Acid (VITAMIN C) POWD Take 333 mg by mouth daily. Emergen-C-take 1/3 packet daily.    . Camphor-Menthol-Methyl Sal (TIGER BALM MUSCLE RUB EX) Apply topically at bedtime.    . Cholecalciferol (VITAMIN D-3) 1000 UNITS CAPS Take by mouth daily.     No current facility-administered medications on file prior to visit.    Allergies  Allergen Reactions  . Yellow Jacket Venom [Bee Venom] Hives    Review of Systems  Review of Systems  Constitutional: Negative for fever, chills and malaise/fatigue.  HENT: Negative for congestion, hearing loss and nosebleeds.   Eyes: Negative for discharge.  Respiratory: Negative for cough, sputum production, shortness of breath and wheezing.   Cardiovascular: Negative for chest pain, palpitations and leg swelling.  Gastrointestinal: Negative for heartburn, nausea, vomiting, abdominal pain, diarrhea, constipation and blood in stool.  Genitourinary: Negative for dysuria, urgency, frequency and hematuria.  Musculoskeletal: Negative for myalgias, back pain and falls.  Skin: Negative for rash.  Neurological: Negative for dizziness, tremors, sensory change, focal weakness, loss of consciousness, weakness and headaches.  Endo/Heme/Allergies: Negative for polydipsia. Does not bruise/bleed easily.  Psychiatric/Behavioral: Negative for depression and suicidal ideas.  The patient is not nervous/anxious and does not have insomnia.     Objective  BP 124/78 mmHg  Pulse 67  Temp(Src) 97.8 F (36.6 C) (Oral)  Ht 5\' 6"  (1.676 m)  Wt 153 lb 3.2 oz (69.491 kg)  BMI 24.74 kg/m2  SpO2 97%  Physical Exam  Physical Exam  Lab Results  Component Value Date   TSH 1.85 10/15/2014   Lab Results  Component Value Date   WBC 6.2 10/15/2014   HGB 14.8 10/15/2014   HCT 44.9 10/15/2014   MCV 92.7 10/15/2014   PLT 205.0 10/15/2014   Lab Results  Component  Value Date   CREATININE 0.7 10/15/2014   BUN 14 10/15/2014   NA 140 10/15/2014   K 4.2 10/15/2014   CL 105 10/15/2014   CO2 24 10/15/2014   Lab Results  Component Value Date   ALT 30 10/15/2014   AST 33 10/15/2014   ALKPHOS 45 10/15/2014   BILITOT 1.5* 10/15/2014   Lab Results  Component Value Date   CHOL 212* 10/15/2014   Lab Results  Component Value Date   HDL 38.90* 10/15/2014   Lab Results  Component Value Date   LDLCALC 159* 10/15/2014   Lab Results  Component Value Date   TRIG 71.0 10/15/2014   Lab Results  Component Value Date   CHOLHDL 5 10/15/2014     Assessment & Plan  Constipation Encouraged increased hydration and fiber in diet. Daily probiotics. If bowels not moving can use MOM 2 tbls po in 4 oz of warm prune juice by mouth every 2-3 days. If no results then repeat in 4 hours with  Dulcolax suppository pr, may repeat again in 4 more hours as needed. Seek care if symptoms worsen. Consider daily Miralax and/or Dulcolax if symptoms persist.   Elevated blood pressure Well controlled. Encouraged heart healthy diet such as the DASH diet and exercise as tolerated.   Hyperlipidemia Encouraged heart healthy diet, increase exercise, avoid trans fats, consider a krill oil cap daily  Medicare annual wellness visit, subsequent Patient denies any difficulties at home. No trouble with ADLs, depression or falls. No recent changes to vision or hearing. Is UTD with immunizations. Is UTD with screening. Discussed Advanced Directives, patient agrees to bring [place] copies of documents if can. Encouraged heart healthy diet, exercise as tolerated and adequate sleep. Reviewed labs today. Is following with urology, Dr Risa Grill, proceed with IFOB. Given to patient today.

## 2014-10-21 NOTE — Assessment & Plan Note (Signed)
Patient denies any difficulties at home. No trouble with ADLs, depression or falls. No recent changes to vision or hearing. Is UTD with immunizations. Is UTD with screening. Discussed Advanced Directives, patient agrees to bring [place] copies of documents if can. Encouraged heart healthy diet, exercise as tolerated and adequate sleep. Reviewed labs today. Is following with urology, Dr Risa Grill, proceed with IFOB. Given to patient today.

## 2014-10-21 NOTE — Assessment & Plan Note (Signed)
Encouraged increased hydration and fiber in diet. Daily probiotics. If bowels not moving can use MOM 2 tbls po in 4 oz of warm prune juice by mouth every 2-3 days. If no results then repeat in 4 hours with  Dulcolax suppository pr, may repeat again in 4 more hours as needed. Seek care if symptoms worsen. Consider daily Miralax and/or Dulcolax if symptoms persist.  

## 2014-10-21 NOTE — Assessment & Plan Note (Signed)
Well controlled. Encouraged heart healthy diet such as the DASH diet and exercise as tolerated.  

## 2014-11-12 ENCOUNTER — Other Ambulatory Visit: Payer: Medicare HMO

## 2014-11-12 ENCOUNTER — Other Ambulatory Visit (INDEPENDENT_AMBULATORY_CARE_PROVIDER_SITE_OTHER): Payer: Medicare HMO

## 2014-11-12 DIAGNOSIS — K5900 Constipation, unspecified: Secondary | ICD-10-CM

## 2014-11-14 ENCOUNTER — Other Ambulatory Visit: Payer: Medicare HMO

## 2014-11-14 LAB — FECAL OCCULT BLOOD, IMMUNOCHEMICAL: Fecal Occult Bld: NEGATIVE

## 2015-04-15 ENCOUNTER — Ambulatory Visit: Payer: Medicare HMO | Admitting: Family Medicine

## 2015-04-24 ENCOUNTER — Encounter: Payer: Self-pay | Admitting: Family Medicine

## 2015-07-09 ENCOUNTER — Encounter: Payer: Self-pay | Admitting: Family Medicine

## 2015-07-09 ENCOUNTER — Ambulatory Visit (INDEPENDENT_AMBULATORY_CARE_PROVIDER_SITE_OTHER): Payer: Medicare HMO | Admitting: Family Medicine

## 2015-07-09 VITALS — BP 130/78 | HR 80 | Temp 98.1°F | Ht 66.0 in | Wt 151.2 lb

## 2015-07-09 DIAGNOSIS — G8929 Other chronic pain: Secondary | ICD-10-CM | POA: Diagnosis not present

## 2015-07-09 DIAGNOSIS — R030 Elevated blood-pressure reading, without diagnosis of hypertension: Secondary | ICD-10-CM

## 2015-07-09 DIAGNOSIS — E785 Hyperlipidemia, unspecified: Secondary | ICD-10-CM | POA: Diagnosis not present

## 2015-07-09 DIAGNOSIS — M545 Low back pain, unspecified: Secondary | ICD-10-CM

## 2015-07-09 DIAGNOSIS — R319 Hematuria, unspecified: Secondary | ICD-10-CM

## 2015-07-09 DIAGNOSIS — IMO0001 Reserved for inherently not codable concepts without codable children: Secondary | ICD-10-CM

## 2015-07-09 DIAGNOSIS — M549 Dorsalgia, unspecified: Secondary | ICD-10-CM

## 2015-07-09 DIAGNOSIS — H269 Unspecified cataract: Secondary | ICD-10-CM

## 2015-07-09 HISTORY — DX: Hematuria, unspecified: R31.9

## 2015-07-09 HISTORY — DX: Unspecified cataract: H26.9

## 2015-07-09 LAB — URINALYSIS
Bilirubin Urine: NEGATIVE
Hgb urine dipstick: NEGATIVE
KETONES UR: NEGATIVE
Leukocytes, UA: NEGATIVE
NITRITE: NEGATIVE
PH: 5.5 (ref 5.0–8.0)
SPECIFIC GRAVITY, URINE: 1.02 (ref 1.000–1.030)
Total Protein, Urine: NEGATIVE
UROBILINOGEN UA: 0.2 (ref 0.0–1.0)
Urine Glucose: NEGATIVE

## 2015-07-09 LAB — COMPREHENSIVE METABOLIC PANEL
ALBUMIN: 4.3 g/dL (ref 3.5–5.2)
ALT: 24 U/L (ref 0–53)
AST: 28 U/L (ref 0–37)
Alkaline Phosphatase: 44 U/L (ref 39–117)
BILIRUBIN TOTAL: 1.1 mg/dL (ref 0.2–1.2)
BUN: 17 mg/dL (ref 6–23)
CO2: 31 mEq/L (ref 19–32)
CREATININE: 0.74 mg/dL (ref 0.40–1.50)
Calcium: 9.3 mg/dL (ref 8.4–10.5)
Chloride: 102 mEq/L (ref 96–112)
GFR: 109.16 mL/min (ref >60.00)
GLUCOSE: 96 mg/dL (ref 70–99)
Potassium: 4.2 mEq/L (ref 3.5–5.1)
Sodium: 137 mEq/L (ref 135–145)
Total Protein: 6.8 g/dL (ref 6.0–8.3)

## 2015-07-09 LAB — CBC
HEMATOCRIT: 43.4 % (ref 39.0–52.0)
Hemoglobin: 14.4 g/dL (ref 13.0–17.0)
MCHC: 33.3 g/dL (ref 30.0–36.0)
MCV: 93.4 fl (ref 78.0–100.0)
Platelets: 188 10*3/uL (ref 150.0–400.0)
RBC: 4.64 Mil/uL (ref 4.22–5.81)
RDW: 13.6 % (ref 11.5–15.5)
WBC: 6.8 10*3/uL (ref 4.0–10.5)

## 2015-07-09 LAB — TSH: TSH: 1.8 u[IU]/mL (ref 0.35–4.50)

## 2015-07-09 LAB — LIPID PANEL
Cholesterol: 156 mg/dL (ref 0–200)
HDL: 43.5 mg/dL (ref >39.00)
LDL Cholesterol: 97 mg/dL (ref 0–99)
NonHDL: 112.37
Total CHOL/HDL Ratio: 4
Triglycerides: 77 mg/dL (ref 0.0–149.0)
VLDL: 15.4 mg/dL (ref 0.0–40.0)

## 2015-07-09 NOTE — Patient Instructions (Signed)
[facility] for free Audiology check. Also, [facility] for audiology  Cholesterol Cholesterol is a white, waxy, fat-like substance needed by your body in small amounts. The liver makes all the cholesterol you need. Cholesterol is carried from the liver by the blood through the blood vessels. Deposits of cholesterol (plaque) may build up on blood vessel walls. These make the arteries narrower and stiffer. Cholesterol plaques increase the risk for heart attack and stroke.  You cannot feel your cholesterol level even if it is very high. The only way to know it is high is with a blood test. Once you know your cholesterol levels, you should keep a record of the test results. Work with your health care provider to keep your levels in the desired range.  WHAT DO THE RESULTS MEAN?  Total cholesterol is a rough measure of all the cholesterol in your blood.   LDL is the so-called bad cholesterol. This is the type that deposits cholesterol in the walls of the arteries. You want this level to be low.   HDL is the good cholesterol because it cleans the arteries and carries the LDL away. You want this level to be high.  Triglycerides are fat that the body can either burn for energy or store. High levels are closely linked to heart disease.  WHAT ARE THE DESIRED LEVELS OF CHOLESTEROL?  Total cholesterol below 200.   LDL below 100 for people at risk, below 70 for those at very high risk.   HDL above 50 is good, above 60 is best.   Triglycerides below 150.  HOW CAN I LOWER MY CHOLESTEROL?  Diet. Follow your diet programs as directed by your health care provider.   Choose fish or white meat chicken and [place], roasted or baked. Limit fatty cuts of red meat, fried foods, and processed meats, such as sausage and lunch meats.   Eat lots of fresh fruits and vegetables.  Choose whole grains, beans, pasta, potatoes, and cereals.   Use only small amounts of olive, corn, or canola oils.   Avoid  butter, mayonnaise, shortening, or palm kernel oils.  Avoid foods with trans fats.   Drink skim or nonfat milk and eat low-fat or nonfat yogurt and cheeses. Avoid whole milk, cream, ice cream, egg yolks, and full-fat cheeses.   Healthy desserts include angel food cake, ginger snaps, animal crackers, hard candy, popsicles, and low-fat or nonfat frozen yogurt. Avoid pastries, cakes, pies, and cookies.   Exercise. Follow your exercise programs as directed by your health care provider.   A regular program helps decrease LDL and raise HDL.   A regular program helps with weight control.   Do things that increase your activity level like gardening, walking, or taking the stairs. Ask your health care provider about how you can be more active in your daily life.   Medicine. Take medicine only as directed by your health care provider.   Medicine may be prescribed by your health care provider to help lower cholesterol and decrease the risk for heart disease.   If you have several risk factors, you may need medicine even if your levels are normal. Document Released: 08/18/2001 Document Revised: 04/09/2014 Document Reviewed: 09/06/2013 [facility] Patient Information 2015 [place], [place]. This information is not intended to replace advice given to you by your health care provider. Make sure you discuss any questions you have with your health care provider. / exam.

## 2015-07-09 NOTE — Assessment & Plan Note (Signed)
Encouraged heart healthy diet, increase exercise, avoid trans fats, consider a krill oil cap daily, recheck lipids today
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
## 2015-07-09 NOTE — Progress Notes (Signed)
Pre visit review using our clinic review tool, if applicable. No additional management support is needed unless otherwise documented below in the visit note. 

## 2015-07-09 NOTE — Assessment & Plan Note (Signed)
Slight, was seen by [facility], saw optician Dr Arsenio Loader

## 2015-07-09 NOTE — Assessment & Plan Note (Signed)
Trace in urinalysis done by urology, repeat today

## 2015-07-21 ENCOUNTER — Encounter: Payer: Self-pay | Admitting: Family Medicine
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
## 2015-07-21 NOTE — Assessment & Plan Note (Signed)
Well controlled. Encouraged heart healthy diet such as the DASH diet and exercise as tolerated.  

## 2015-08-20 ENCOUNTER — Ambulatory Visit: Payer: Medicare HMO | Attending: Orthopedic Surgery | Admitting: Physical Therapy

## 2015-08-20 DIAGNOSIS — M545 Low back pain, unspecified: Secondary | ICD-10-CM

## 2015-08-20 DIAGNOSIS — R262 Difficulty in walking, not elsewhere classified: Secondary | ICD-10-CM | POA: Insufficient documentation

## 2015-08-20 DIAGNOSIS — R531 Weakness: Secondary | ICD-10-CM | POA: Insufficient documentation

## 2015-08-20 DIAGNOSIS — M2560 Stiffness of unspecified joint, not elsewhere classified: Secondary | ICD-10-CM | POA: Insufficient documentation
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
## 2015-08-20 NOTE — Patient Instructions (Signed)
Supine abdominal brace 5x 7 sec hold, abdominal brace with hand to opposite brace isometric;  Prone multifidi activation 5x, multifidi activation with knee curls 5x R/L

## 2015-09-03 ENCOUNTER — Ambulatory Visit: Payer: Medicare HMO | Admitting: Physical Therapy

## 2015-09-03 DIAGNOSIS — R531 Weakness: Secondary | ICD-10-CM

## 2015-09-03 DIAGNOSIS — M545 Low back pain, unspecified: Secondary | ICD-10-CM

## 2015-09-03 DIAGNOSIS — R262 Difficulty in walking, not elsewhere classified: Secondary | ICD-10-CM

## 2015-09-03 DIAGNOSIS — M2560 Stiffness of unspecified joint, not elsewhere classified: Secondary | ICD-10-CM
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
## 2015-09-05 ENCOUNTER — Ambulatory Visit: Payer: Medicare HMO | Admitting: Physical Therapy

## 2015-09-05 DIAGNOSIS — M545 Low back pain, unspecified: Secondary | ICD-10-CM

## 2015-09-05 DIAGNOSIS — R531 Weakness: Secondary | ICD-10-CM

## 2015-09-05 DIAGNOSIS — R262 Difficulty in walking, not elsewhere classified: Secondary | ICD-10-CM

## 2015-09-05 DIAGNOSIS — M2560 Stiffness of unspecified joint, not elsewhere classified: Secondary | ICD-10-CM

## 2015-09-05 NOTE — Therapy (Signed)
Troy Mccann, [place], [address] Phone: [phone]   Fax:  [phone]  Physical Therapy Treatment  Patient Details  Name: Troy Mccann MRN: [id number] Date of Birth: 19-Nov-1941 Referring Provider:  Mosie Lukes, MD  Encounter Date: 09/05/2015      PT End of Session - 09/05/15 1155    Visit Number 3   Number of Visits 16   Date for PT Re-Evaluation 10/15/15   Authorization Type [employer] Medicare; G codes; 10th visit prog note; KX at visit 15   PT Start Time 1050   PT Stop Time 1150   PT Time Calculation (min) 60 min   Activity Tolerance Patient tolerated treatment well      Past Medical History  Diagnosis Date  . Tick bite 2007    Never assessed by a physician.  Marland Kitchen Unspecified constipation 03/08/2013  . BPH (benign prostatic hyperplasia) 04/15/2014  . Other and unspecified hyperlipidemia 04/15/2014  . Medicare annual wellness visit, subsequent 10/21/2014  . SCC (squamous cell carcinoma) 10/21/2014  . Hematuria 07/09/2015  . Cataract 07/09/2015    No past surgical history on file.  There were no vitals filed for this visit.  Visit Diagnosis:  Bilateral low back pain without sciatica  Weakness  Joint stiffness of spine  Difficulty in walking      Subjective Assessment - 09/05/15 1343    Subjective Doing well.  States he likes the HS stretches with strap.     Currently in Pain? No/denies   Pain Location Back   Pain Orientation Left;Lower   Pain Type Chronic pain   Aggravating Factors  low surface, bending                         [facility] Adult PT Treatment/Exercise - 09/05/15 0001    Lumbar Exercises: Stretches   Quad Stretch 3 reps;30 seconds   Quad Stretch Limitations Hip flexor over side of bed R/L   Lumbar Exercises: Aerobic   Stationary Bike Nu-Step L5 10 min   Lumbar Exercises: Quadruped   Opposite Arm/Leg Raise Right arm/Left leg;Left arm/Right leg;10 reps    Shoulder Exercises: Standing   Extension Strengthening;Both;15 reps;Theraband   Theraband Level (Shoulder Extension) Level 2 (Red)   Row Strengthening;Both;15 reps   Theraband Level (Shoulder Row) Level 2 (Red)   Other Standing Exercises UE diagonals with abdominal brace red band 15x right and left   Other Standing Exercises Single leg stand with UE single arm diagonals extensions  15x R/L                PT Education - 09/05/15 1154    Education provided Yes   Education Details Hip flexor stretch;  red band diagonals, rows, extensions   Person(s) Educated Patient   Methods Explanation;Demonstration;Handout   Comprehension Verbalized understanding;Returned demonstration          PT Short Term Goals - 09/05/15 1340    PT SHORT TERM GOAL #1   Title The patient will have a basic understanding of core stabilization and strengthening HEP   Status Achieved   PT SHORT TERM GOAL #2   Title Improved B HS length to 75 degrees needed for picking up objects Mccann the floor and basic household chores   Time 4   Period Weeks   Status On-going   PT SHORT TERM GOAL #3   Title Lumbar extension ROM improved to 10 degrees needed for walking longer periods of  time   Time 4   Period Weeks   Status On-going           PT Long Term Goals - 09/05/15 1341    PT LONG TERM GOAL #1   Title The patient will be instructed in safe, self progression of HEP/gym program   Time 8   Period Weeks   Status On-going   PT LONG TERM GOAL #2   Title The patient will report a 50% improvement in pain and function with daily activities.     Time 8   Period Weeks   Status On-going   PT LONG TERM GOAL #3   Title Abdominal and trunk extensor strength improved to 4/5 needed for standing longer periods of time, kneeling   Time 8   Period Weeks   Status On-going   PT LONG TERM GOAL #4   Title Bilateral hip abduction strength improved to 4/5 needed for walking, standing community distances   Time 8    Period Weeks   Status On-going   PT LONG TERM GOAL #5   Title Modified Oswestry score improved from 44% to 34% indicating improved function with less pain   Time 8   Period Weeks   Status On-going   Additional Long Term Goals   Additional Long Term Goals Yes   PT LONG TERM GOAL #6   Title FOTO functional outcome score improved from 53% limitation to 43% limitation indicating improved function with less pain   Time 8   Period Weeks   Status On-going               Plan - 09/05/15 1155    Clinical Impression Statement The patient reports good compliance with previous HEP.  He is progressing well with abdominal bracing and gluteal activation although left side weakness from spinal cord involvement does have an impact.  In standing he tends to hyperextend his knee in compensation.  He is detail oriented on technique.  Therapist closely monitoring for safety in standing especially secondary to left LE weakness.  Continue with treatment to establish safe self progression of home and gym program.     PT Next Visit Plan give quadruped exercises to add to HEP;  standing core exercises;  continue Nu-Step;  ? hip extension with band; check extension ROM and HS length for STGs        Problem List Patient Active Problem List   Diagnosis Date Noted  . Hematuria 07/09/2015  . Cataract 07/09/2015  . Medicare annual wellness visit, subsequent 10/21/2014  . SCC (squamous cell carcinoma) 10/21/2014  . BPH (benign prostatic hyperplasia) 04/15/2014  . Hyperlipidemia 04/15/2014  . Constipation 03/08/2013  . Elevated blood pressure 11/08/2012  . Arthralgia 09/05/2012  . Weight loss 09/05/2012  . Chronic back pain 09/05/2012    Troy Mccann 09/05/2015, 1:44 PM  [facility] [address] [place], [place], [address] Phone: [phone]   Fax:  [phone]   Ruben Im, PT 09/05/2015 1:44 PM Phone: [phone] Fax:  [phone]

## 2015-09-09 DIAGNOSIS — D045 Carcinoma in situ of skin of trunk: Secondary | ICD-10-CM | POA: Diagnosis not present

## 2015-09-11 ENCOUNTER — Ambulatory Visit: Payer: Medicare HMO | Attending: Orthopedic Surgery | Admitting: Physical Therapy

## 2015-09-11 DIAGNOSIS — M2560 Stiffness of unspecified joint, not elsewhere classified: Secondary | ICD-10-CM | POA: Diagnosis not present

## 2015-09-11 DIAGNOSIS — M545 Low back pain, unspecified: Secondary | ICD-10-CM

## 2015-09-11 DIAGNOSIS — R531 Weakness: Secondary | ICD-10-CM | POA: Diagnosis not present

## 2015-09-11 DIAGNOSIS — R262 Difficulty in walking, not elsewhere classified: Secondary | ICD-10-CM | POA: Insufficient documentation

## 2015-09-11 NOTE — Patient Instructions (Signed)
   Bracing With Arm Raise (Quadruped)  On hands and knees find neutral spine. Tighten pelvic floor and abdominals and hold. Alternately lift arm to shoulder level. Hold 5 sec. Repeat _10__ times. Do _2__ times a day.  Quadruped Alternate Hip Extension   Shift weight to one side and raise opposite leg. Keep trunk steady. Hold 5 sec. _10__ reps per set, _1-2__ sets per day, _7__ days per week Repeat with other leg.  Bracing With Arm / Leg Raise (Quadruped)  On hands and knees find neutral spine. Tighten pelvic floor and abdominals and hold. Alternating, lift arm to shoulder level and opposite leg to hip level.  HOLD 5 sec.Repeat _10__ times. Do __2_ times a day.

## 2015-09-11 NOTE — Therapy (Signed)
[place], [place], [address] Phone: [phone]   Fax:  [phone]  Physical Therapy Treatment  Patient Details  Name: Troy Mccann MRN: [id number] Date of Birth: 04-May-1941 Referring Provider:  Renette Butters, MD  Encounter Date: 09/11/2015      PT End of Session - 09/11/15 1234    Visit Number 4   Number of Visits 16   Date for PT Re-Evaluation 10/15/15   Authorization Type [employer] Medicare; G codes; 10th visit prog note; KX at visit 15   PT Start Time 1145   PT Stop Time 1230   PT Time Calculation (min) 45 min      Past Medical History  Diagnosis Date  . Tick bite 2007    Never assessed by a physician.  Marland Kitchen Unspecified constipation 03/08/2013  . BPH (benign prostatic hyperplasia) 04/15/2014  . Other and unspecified hyperlipidemia 04/15/2014  . Medicare annual wellness visit, subsequent 10/21/2014  . SCC (squamous cell carcinoma) 10/21/2014  . Hematuria 07/09/2015  . Cataract 07/09/2015    No past surgical history on file.  There were no vitals filed for this visit.  Visit Diagnosis:  Bilateral low back pain without sciatica  Weakness  Joint stiffness of spine  Difficulty in walking      Subjective Assessment - 09/11/15 1151    Subjective Mild pain   Currently in Pain? Yes   Pain Score 2    Pain Location Back   Pain Orientation Mid;Lower;Left   Aggravating Factors  bending, low surface   Pain Relieving Factors lying on back            [facility] PT Assessment - 09/11/15 1419    AROM   Lumbar Extension 2   Flexibility   Soft Tissue Assessment /Muscle Length --  SLR 50 degrees bilateral                     [facility] Adult PT Treatment/Exercise - 09/11/15 1321    Lumbar Exercises: Aerobic   Stationary Bike Nu-Step L5 10 min   Lumbar Exercises: Prone   Other Prone Lumbar Exercises multifidi with pillow under hips with hip extension and knee flexion 10x each, donkey  kicks x 10 each   Lumbar Exercises: Quadruped   Madcat/Old Horse 5 reps   Opposite Arm/Leg Raise Right arm/Left leg;Left arm/Right leg;10 reps;Other (comment)   Opposite Arm/Leg Raise Limitations 5-10 second holds with cues for neutral spine   Knee/Hip Exercises: Standing   Hip Extension 3 sets;10 reps   Extension Limitations 1 set AROM 2 sets with yellow band and max cues for posture and compensations   Knee/Hip Exercises: Sidelying   Hip ABduction 10 reps   Hip ABduction Limitations left   Clams x15, then x15 with yellow band                PT Education - 09/11/15 1230    Education provided Yes   Education Details Quadruped Alt UE/LE   Person(s) Educated Patient   Methods Explanation;Handout   Comprehension Verbalized understanding          PT Short Term Goals - 09/05/15 1340    PT SHORT TERM GOAL #1   Title The patient will have a basic understanding of core stabilization and strengthening HEP   Status Achieved   PT SHORT TERM GOAL #2   Title Improved B HS length to 75 degrees needed for picking up objects off the floor and basic  household chores   Time 4   Period Weeks   Status On-going   PT SHORT TERM GOAL #3   Title Lumbar extension ROM improved to 10 degrees needed for walking longer periods of time   Time 4   Period Weeks   Status On-going           PT Long Term Goals - 09/05/15 1341    PT LONG TERM GOAL #1   Title The patient will be instructed in safe, self progression of HEP/gym program   Time 8   Period Weeks   Status On-going   PT LONG TERM GOAL #2   Title The patient will report a 50% improvement in pain and function with daily activities.     Time 8   Period Weeks   Status On-going   PT LONG TERM GOAL #3   Title Abdominal and trunk extensor strength improved to 4/5 needed for standing longer periods of time, kneeling   Time 8   Period Weeks   Status On-going   PT LONG TERM GOAL #4   Title Bilateral hip abduction strength improved  to 4/5 needed for walking, standing community distances   Time 8   Period Weeks   Status On-going   PT LONG TERM GOAL #5   Title Modified Oswestry score improved from 44% to 34% indicating improved function with less pain   Time 8   Period Weeks   Status On-going   Additional Long Term Goals   Additional Long Term Goals Yes   PT LONG TERM GOAL #6   Title FOTO functional outcome score improved from 53% limitation to 43% limitation indicating improved function with less pain   Time 8   Period Weeks   Status On-going               Plan - 09/11/15 1325    Clinical Impression Statement Review of quadruped today well pt requiring min cues for maintaining nuetral spine. Added these to HEP. Pt also instructed in standing hip extension with yellow band, he required mod to max cues for posture. Tried clams with yellow band and min cues for positioning. Pt given yellow band for home clams.    PT Next Visit Plan review quadruped, clams with yellow band and continue hip extension with yellow band in standing        Problem List Patient Active Problem List   Diagnosis Date Noted  . Hematuria 07/09/2015  . Cataract 07/09/2015  . Medicare annual wellness visit, subsequent 10/21/2014  . SCC (squamous cell carcinoma) 10/21/2014  . BPH (benign prostatic hyperplasia) 04/15/2014  . Hyperlipidemia 04/15/2014  . Constipation 03/08/2013  . Elevated blood pressure 11/08/2012  . Arthralgia 09/05/2012  . Weight loss 09/05/2012  . Chronic back pain 09/05/2012    Dorene Ar, [place] 09/11/2015, 4:26 PM  [facility] [address] [place], [place], [address] Phone: [phone]   Fax:  [phone]

## 2015-09-17 ENCOUNTER — Encounter: Payer: Medicare HMO | Admitting: Physical Therapy

## 2015-09-24 ENCOUNTER — Ambulatory Visit: Payer: Medicare HMO | Admitting: Physical Therapy

## 2015-09-24 DIAGNOSIS — M2560 Stiffness of unspecified joint, not elsewhere classified: Secondary | ICD-10-CM | POA: Diagnosis not present

## 2015-09-24 DIAGNOSIS — M545 Low back pain, unspecified: Secondary | ICD-10-CM

## 2015-09-24 DIAGNOSIS — R262 Difficulty in walking, not elsewhere classified: Secondary | ICD-10-CM

## 2015-09-24 DIAGNOSIS — R531 Weakness: Secondary | ICD-10-CM | POA: Diagnosis not present

## 2015-09-24 NOTE — Therapy (Signed)
[place], [place], [address] Phone: [phone]   Fax:  [phone]  Physical Therapy Treatment  Patient Details  Name: Troy Mccann MRN: [id number] Date of Birth: September 21, 1941 No Data Recorded  Encounter Date: 09/24/2015      PT End of Session - 09/24/15 1253    Visit Number 5   Number of Visits 16   Date for PT Re-Evaluation 10/15/15   Authorization Type [employer] Medicare; G codes; 10th visit prog note; KX at visit 15   PT Start Time 1140   PT Stop Time 1240   PT Time Calculation (min) 60 min   Activity Tolerance Patient tolerated treatment well      Past Medical History  Diagnosis Date  . Tick bite 2007    Never assessed by a physician.  Marland Kitchen Unspecified constipation 03/08/2013  . BPH (benign prostatic hyperplasia) 04/15/2014  . Other and unspecified hyperlipidemia 04/15/2014  . Medicare annual wellness visit, subsequent 10/21/2014  . SCC (squamous cell carcinoma) 10/21/2014  . Hematuria 07/09/2015  . Cataract 07/09/2015    No past surgical history on file.  There were no vitals filed for this visit.  Visit Diagnosis:  Bilateral low back pain without sciatica  Weakness  Joint stiffness of spine  Difficulty in walking      Subjective Assessment - 09/24/15 1144    Subjective I'm doing well.  Doing the Nu-Step at the gym now on level 6.  The handouts really help me.  I feel better after therapy but the next morning it is hard to get up.   Currently in Pain? Yes   Pain Score 1    Pain Location Back   Pain Orientation Lower   Pain Type Chronic pain   Pain Onset More than a month ago   Pain Frequency Constant   Aggravating Factors  bending, low surface   Pain Relieving Factors lying on back                         [facility] Adult PT Treatment/Exercise - 09/24/15 1157    Lumbar Exercises: Aerobic   Stationary Bike Nu-Step L6 10 min   Lumbar Exercises: Supine   Other Supine  Lumbar Exercises Review of abdom brace series   Lumbar Exercises: Quadruped   Opposite Arm/Leg Raise Limitations 10x   Plank 5x 10-15 sec holds   Knee/Hip Exercises: Standing   Hip Abduction Stengthening;Right;Left;2 sets;10 reps   Hip Extension Stengthening;Right;Left;2 sets;10 reps   Other Standing Knee Exercises side step yellow band 10x2   Other Standing Knee Exercises hip hinge practice    Knee/Hip Exercises: Sidelying   Clams yellow band 15x R/L    Other Sidelying Knee/Hip Exercises side planks 3x R/L                PT Education - 09/24/15 1252    Education provided Yes   Education Details standing hip ext and abd for HEP   Person(s) Educated Patient   Methods Explanation;Demonstration;Handout   Comprehension Verbalized understanding;Returned demonstration          PT Short Term Goals - 09/24/15 1305    PT SHORT TERM GOAL #1   Title The patient will have a basic understanding of core stabilization and strengthening HEP   Status Achieved   PT SHORT TERM GOAL #2   Title Improved B HS length to 75 degrees needed for picking up objects off the floor and basic  household chores   Time 4   Period Weeks   Status On-going   PT SHORT TERM GOAL #3   Title Lumbar extension ROM improved to 10 degrees needed for walking longer periods of time   Time 4   Period Weeks   Status On-going           PT Long Term Goals - 09/24/15 1306    PT LONG TERM GOAL #1   Title The patient will be instructed in safe, self progression of HEP/gym program   Time 8   Period Weeks   Status On-going   PT LONG TERM GOAL #2   Title The patient will report a 50% improvement in pain and function with daily activities.     Time 8   Period Weeks   Status On-going   PT LONG TERM GOAL #3   Title Abdominal and trunk extensor strength improved to 4/5 needed for standing longer periods of time, kneeling   Time 8   Period Weeks   Status On-going   PT LONG TERM GOAL #4   Title Bilateral hip  abduction strength improved to 4/5 needed for walking, standing community distances   Time 8   Period Weeks   Status On-going   PT LONG TERM GOAL #5   Title Modified Oswestry score improved from 44% to 34% indicating improved function with less pain   Time 8   Period Weeks   Status On-going   PT LONG TERM GOAL #6   Title FOTO functional outcome score improved from 53% limitation to 43% limitation indicating improved function with less pain   Time 8   Period Weeks   Status On-going               Plan - 09/24/15 1253    Clinical Impression Statement The patient demonstrates good compliance with current home and gym program.  He needs some cuing for proper technique with clams and quadruped.  Difficulty achieving hip hinge without spinal flexion.  No complaint of back pain with moderate intensity exercises today.  Highly motivated.     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 Hematuria 07/09/2015  . Cataract 07/09/2015  . Medicare annual wellness visit, subsequent 10/21/2014  . SCC (squamous cell carcinoma) 10/21/2014  . BPH (benign prostatic hyperplasia) 04/15/2014  . Hyperlipidemia 04/15/2014  . Constipation 03/08/2013  . Elevated blood pressure 11/08/2012  . Arthralgia 09/05/2012  . Weight loss 09/05/2012  . Chronic back pain 09/05/2012    [name] 09/24/2015, 1:07 PM  [facility] [address] [place], [place], [address] Phone: [phone]   Fax:  [phone]  Name: Troy Mccann MRN: [id number] Date of Birth: [birth year]  Ruben Im, PT 09/24/2015 1:08 PM Phone: [phone] Fax:  [phone]

## 2015-09-26 ENCOUNTER — Encounter: Payer: Medicare HMO | Admitting: Physical Therapy

## 2015-10-01 ENCOUNTER — Ambulatory Visit: Payer: Medicare HMO | Admitting: Physical Therapy

## 2015-10-01 DIAGNOSIS — R262 Difficulty in walking, not elsewhere classified: Secondary | ICD-10-CM

## 2015-10-01 DIAGNOSIS — M545 Low back pain, unspecified: Secondary | ICD-10-CM

## 2015-10-01 DIAGNOSIS — M2560 Stiffness of unspecified joint, not elsewhere classified: Secondary | ICD-10-CM | POA: Diagnosis not present

## 2015-10-01 DIAGNOSIS — R531 Weakness: Secondary | ICD-10-CM | POA: Diagnosis not present

## 2015-10-01 NOTE — Therapy (Signed)
[place], [place], [address] Phone: [phone]   Fax:  [phone]  Physical Therapy Treatment  Patient Details  Name: Troy Mccann MRN: [id number] Date of Birth: Feb 23, 1941 No Data Recorded  Encounter Date: 10/01/2015      PT End of Session - 10/01/15 1134    Visit Number 6   Number of Visits 16   Date for PT Re-Evaluation 10/15/15   PT Start Time 2025   PT Stop Time 4270   PT Time Calculation (min) 47 min   Activity Tolerance Patient tolerated treatment well;No increased pain   Behavior During Therapy [facility] for tasks assessed/performed      Past Medical History  Diagnosis Date  . Tick bite 2007    Never assessed by a physician.  Marland Kitchen Unspecified constipation 03/08/2013  . BPH (benign prostatic hyperplasia) 04/15/2014  . Other and unspecified hyperlipidemia 04/15/2014  . Medicare annual wellness visit, subsequent 10/21/2014  . SCC (squamous cell carcinoma) 10/21/2014  . Hematuria 07/09/2015  . Cataract 07/09/2015    No past surgical history on file.  There were no vitals filed for this visit.  Visit Diagnosis:  Bilateral low back pain without sciatica  Weakness  Joint stiffness of spine  Difficulty in walking      Subjective Assessment - 10/01/15 1028    Subjective Feels balance has iimproved inside and laterah hip is stronger  small ach Lt back today   Currently in Pain? Yes   Pain Score 1    Pain Location Back   Pain Orientation Left;Lower   Pain Frequency Constant   Aggravating Factors  Standing in on position                           [facility] Adult PT Treatment/Exercise - 10/01/15 1025    Lumbar Exercises: Seated   Hip Flexion on Ball AROM;Strengthening;Both;5 reps;Limitations   Hip Flexion on Ball Limitations difficult to hold neutral.     Other Seated Lumbar Exercises circles, figure 8, side to side.     Lumbar Exercises: Supine   Other Supine Lumbar Exercises  supinr ball exercises: Bridge, bridge with SLR, bridge with hamstring curls,   Loxer trunk rotation, legs on ball.  ball pass from knees to hands and then reach overhead with ball as legs stretch out as they are lowered.     Lumbar Exercises: Quadruped   Straight Leg Raise 10 reps  over ball, leg straight and flexed.  Figure 8 for challange   Opposite Arm/Leg Raise Limitations Over ball with cues close stand by assist                 PT Education - 10/01/15 1134    Education provided Yes   Education Details ball sitting, quadriped, supine   Person(s) Educated Patient   Methods Explanation;Demonstration;Tactile cues;Verbal cues;Handout   Comprehension Verbalized understanding;Returned demonstration          PT Short Term Goals - 10/01/15 1137    PT SHORT TERM GOAL #1   Title The patient will have a basic understanding of core stabilization and strengthening HEP   Time 4   Period Weeks   Status Achieved   PT SHORT TERM GOAL #2   Title Improved B HS length to 75 degrees needed for picking up objects off the floor and basic household chores   Time 4   Period Weeks   Status Unable to assess  PT SHORT TERM GOAL #3   Title Lumbar extension ROM improved to 10 degrees needed for walking longer periods of time   Time 4   Status Unable to assess           PT Long Term Goals - 10/01/15 1138    PT LONG TERM GOAL #1   Title The patient will be instructed in safe, self progression of HEP/gym program   Time 8   Period Weeks   Status On-going   PT LONG TERM GOAL #2   Title The patient will report a 50% improvement in pain and function with daily activities.     Baseline more strady with inside gait   Time 8   Period Weeks   Status On-going   PT LONG TERM GOAL #3   Title Abdominal and trunk extensor strength improved to 4/5 needed for standing longer periods of time, kneeling   Time 8   Period Weeks   Status Unable to assess   PT LONG TERM GOAL #4   Title Bilateral  hip abduction strength improved to 4/5 needed for walking, standing community distances   Time 8   Period Weeks   Status Unable to assess   PT LONG TERM GOAL #5   Title Modified Oswestry score improved from 44% to 34% indicating improved function with less pain   Time 8   Period Weeks   Status Unable to assess   PT LONG TERM GOAL #6   Title FOTO functional outcome score improved from 53% limitation to 43% limitation indicating improved function with less pain   Time 8   Period Weeks   Status Unable to assess               Plan - 10/01/15 1135    Clinical Impression Statement Ball exercises only today.  They were very challanging. Progress toward home exercises.  Patient is motivated.   PT Next Visit Plan  review HEP as needed; review hip hinge; recheck HS length and lumbar extension for STGs  Patient is now doing Nustep at the gym.    PT Home Exercise Plan ball exercises   Consulted and Agree with Plan of Care Patient        Problem List Patient Active Problem List   Diagnosis Date Noted  . Hematuria 07/09/2015  . Cataract 07/09/2015  . Medicare annual wellness visit, subsequent 10/21/2014  . SCC (squamous cell carcinoma) 10/21/2014  . BPH (benign prostatic hyperplasia) 04/15/2014  . Hyperlipidemia 04/15/2014  . Constipation 03/08/2013  . Elevated blood pressure 11/08/2012  . Arthralgia 09/05/2012  . Weight loss 09/05/2012  . Chronic back pain 09/05/2012    [facility] 10/01/2015, 11:39 AM  [facility] [address] [place], [place], [address] Phone: [phone]   Fax:  [phone]  Name: Troy Mccann MRN: [id number] Date of Birth: 12-19-1940    Melvenia Needles, PTA 10/01/2015 11:39 AM Phone: [phone] Fax: [phone]

## 2015-10-01 NOTE — Patient Instructions (Signed)
Ball exercises issued from exercise drawer.  Some handwritten.

## 2015-10-03 ENCOUNTER — Ambulatory Visit: Payer: Medicare HMO | Admitting: Physical Therapy

## 2015-10-03 DIAGNOSIS — M545 Low back pain, unspecified: Secondary | ICD-10-CM

## 2015-10-03 DIAGNOSIS — M2560 Stiffness of unspecified joint, not elsewhere classified: Secondary | ICD-10-CM

## 2015-10-03 DIAGNOSIS — R262 Difficulty in walking, not elsewhere classified: Secondary | ICD-10-CM

## 2015-10-03 DIAGNOSIS — R531 Weakness: Secondary | ICD-10-CM | POA: Diagnosis not present

## 2015-10-03 NOTE — Therapy (Signed)
[place], [place], [address] Phone: [phone]   Fax:  [phone]  Physical Therapy Treatment  Patient Details  Name: Troy Mccann MRN: [id number] Date of Birth: [birth year] No Data Recorded  Encounter Date: 10/03/2015      PT End of Session - 10/03/15 1225    Visit Number 7   Number of Visits 16   Date for PT Re-Evaluation 10/15/15   Authorization Type [employer] Medicare; G codes; 10th visit prog note; KX at visit 15   PT Start Time 1145   PT Stop Time 1230   PT Time Calculation (min) 45 min   Activity Tolerance Patient tolerated treatment well      Past Medical History  Diagnosis Date  . Tick bite 2007    Never assessed by a physician.  Marland Kitchen Unspecified constipation 03/08/2013  . BPH (benign prostatic hyperplasia) 04/15/2014  . Other and unspecified hyperlipidemia 04/15/2014  . Medicare annual wellness visit, subsequent 10/21/2014  . SCC (squamous cell carcinoma) 10/21/2014  . Hematuria 07/09/2015  . Cataract 07/09/2015    No past surgical history on file.  There were no vitals filed for this visit.  Visit Diagnosis:  Bilateral low back pain without sciatica  Weakness  Joint stiffness of spine  Difficulty in walking      Subjective Assessment - 10/03/15 1145    Subjective Had a close call almost coming here, stubbed shoe and almost fell but recovered.  Haven't tried the ball exercises at home yet because of time.  Reports good compliance with abdominal bracing.  Prone left HS curls less difficult.     Currently in Pain? Yes   Pain Score 1    Pain Location Back   Pain Orientation Lower   Pain Type Chronic pain   Pain Onset More than a month ago   Pain Frequency Constant   Aggravating Factors  harder to move when it is cold   Pain Relieving Factors hot water bottle            [facility] PT Assessment - 10/03/15 1156    AROM   Lumbar Extension 15   Strength   Lumbar Flexion 4+/5   Lumbar Extension 4/5   Flexibility   Soft Tissue Assessment /Muscle Length --  R 72, L 80                     [facility] Adult PT Treatment/Exercise - 10/03/15 1205    Lumbar Exercises: Seated   Other Seated Lumbar Exercises sitting on physioball with UE red band HABD and LE march, knee extensions, toe taps 10x each   Lumbar Exercises: Supine   Ab Set 5 reps   Bent Knee Raise 5 reps   Bridge 10 reps  over ball   Other Supine Lumbar Exercises Supine with    Other Supine Lumbar Exercises Supine with LEs over ball:  HS sets, HS with bridge, abdominal LE lifts    Lumbar Exercises: Prone   Other Prone Lumbar Exercises multifidi with HS curls 3x   Knee/Hip Exercises: Machines for Strengthening   Hip Cybex 30# 4 ways 15x R/L                  PT Short Term Goals - 10/03/15 1232    PT SHORT TERM GOAL #1   Title The patient will have a basic understanding of core stabilization and strengthening HEP   Status Achieved   PT SHORT TERM GOAL #  2   Title Improved B HS length to 75 degrees needed for picking up objects off the floor and basic household chores   Status Achieved   PT SHORT TERM GOAL #3   Status Achieved   PT SHORT TERM GOAL #4   Title The patient will be independent in an intermediate level of core and hip strengthening program for the gym and home   Time 2   Period Weeks   Status New           PT Long Term Goals - 10/01/15 1138    PT LONG TERM GOAL #1   Title The patient will be instructed in safe, self progression of HEP/gym program   Time 8   Period Weeks   Status On-going   PT LONG TERM GOAL #2   Title The patient will report a 50% improvement in pain and function with daily activities.     Baseline more strady with inside gait   Time 8   Period Weeks   Status On-going   PT LONG TERM GOAL #3   Title Abdominal and trunk extensor strength improved to 4/5 needed for standing longer periods of time, kneeling   Time 8   Period Weeks   Status  Unable to assess   PT LONG TERM GOAL #4   Title Bilateral hip abduction strength improved to 4/5 needed for walking, standing community distances   Time 8   Period Weeks   Status Unable to assess   PT LONG TERM GOAL #5   Title Modified Oswestry score improved from 44% to 34% indicating improved function with less pain   Time 8   Period Weeks   Status Unable to assess   PT LONG TERM GOAL #6   Title FOTO functional outcome score improved from 53% limitation to 43% limitation indicating improved function with less pain   Time 8   Period Weeks   Status Unable to assess               Plan - 10/03/15 1225    Clinical Impression Statement Patient continues to be highly compliant with HEP and receptive to core and hip strengthening.  New goal added.  No exacerbation of pain with progressive strengthening program.  Much improved lumbar extension ROM as well as HS length.  Progressing well with goals.  Therapist closely monitoring pain and postural alignment and cuing as needed.   PT Next Visit Plan Continue with core and hip strengthening for home and gym program;  promoting toward independence in 1-2 weeks ( 2 visits)        Problem List Patient Active Problem List   Diagnosis Date Noted  . Hematuria 07/09/2015  . Cataract 07/09/2015  . Medicare annual wellness visit, subsequent 10/21/2014  . SCC (squamous cell carcinoma) 10/21/2014  . BPH (benign prostatic hyperplasia) 04/15/2014  . Hyperlipidemia 04/15/2014  . Constipation 03/08/2013  . Elevated blood pressure 11/08/2012  . Arthralgia 09/05/2012  . Weight loss 09/05/2012  . Chronic back pain 09/05/2012    Troy Mccann 10/03/2015, 12:48 PM  [facility] [address] [place], [place], [address] Phone: [phone]   Fax:  [phone]  Name: Troy Mccann MRN: [id number] Date of Birth: 1941/06/15  Ruben Im, PT 10/03/2015 12:48 PM Phone: [phone] Fax:  [phone]

## 2015-10-08 ENCOUNTER — Ambulatory Visit: Payer: Medicare HMO | Attending: Orthopedic Surgery | Admitting: Physical Therapy

## 2015-10-08 DIAGNOSIS — M545 Low back pain, unspecified: Secondary | ICD-10-CM

## 2015-10-08 DIAGNOSIS — R531 Weakness: Secondary | ICD-10-CM | POA: Insufficient documentation

## 2015-10-08 DIAGNOSIS — M2560 Stiffness of unspecified joint, not elsewhere classified: Secondary | ICD-10-CM | POA: Diagnosis not present

## 2015-10-08 DIAGNOSIS — R262 Difficulty in walking, not elsewhere classified: Secondary | ICD-10-CM | POA: Diagnosis not present

## 2015-10-08 NOTE — Therapy (Addendum)
Troy Mccann, [place], [address] Phone: [phone]   Fax:  [phone]  Physical Therapy Treatment  Patient Details  Name: Troy Mccann MRN: [id number] Date of Birth: 11-07-1941 No Data Recorded  Encounter Date: 10/08/2015      PT End of Session - 10/08/15 1207    Visit Number 8   Number of Visits 16   Date for PT Re-Evaluation 10/15/15   Authorization Type [employer]; G codes; 10th visit prog note; KX at visit 15   PT Start Time 1145   PT Stop Time 1230   PT Time Calculation (min) 45 min   Activity Tolerance Patient tolerated treatment well      Past Medical History  Diagnosis Date  . Tick bite 2007    Never assessed by a physician.  Marland Kitchen Unspecified constipation 03/08/2013  . BPH (benign prostatic hyperplasia) 04/15/2014  . Other and unspecified hyperlipidemia 04/15/2014  . Medicare annual wellness visit, subsequent 10/21/2014  . SCC (squamous cell carcinoma) 10/21/2014  . Hematuria 07/09/2015  . Cataract 07/09/2015    No past surgical history on file.  There were no vitals filed for this visit.  Visit Diagnosis:  Bilateral low back pain without sciatica  Weakness  Joint stiffness of spine  Difficulty in walking      Subjective Assessment - 10/08/15 1147    Subjective Reports he is doing fine.  Denies excessive soreness after last visit.  One question about the ball exercises.     Currently in Pain? Yes   Pain Score 2    Pain Location Back   Pain Orientation Lower   Pain Type Chronic pain   Aggravating Factors  bend my knees a lot like to squat but it's been like that for a while            [facility] PT Assessment - 10/08/15 1204    Observation/Other Assessments   Modified Oswertry 34%                     [facility] Adult PT Treatment/Exercise - 10/08/15 1202    Lumbar Exercises: Stretches   Active Hamstring Stretch 2 reps;30 seconds   Lumbar Exercises: Supine   Other  Supine Lumbar Exercises Supine with LEs over ball:  HS sets, HS with bridge, abdominal LE lifts    Knee/Hip Exercises: Machines for Strengthening   Total Gym Leg Press 40# B, 20# single and calf raises 20# 15x each   Hip Cybex 37.5# 20x B 4 ways        Verbal review of previous HEP with technique correction.          PT Short Term Goals - 10/08/15 1207    PT SHORT TERM GOAL #1   Title The patient will have a basic understanding of core stabilization and strengthening HEP   Status Achieved   PT SHORT TERM GOAL #2   Title Improved B HS length to 75 degrees needed for picking up objects off the floor and basic household chores   Status Achieved   PT SHORT TERM GOAL #3   Title Lumbar extension ROM improved to 10 degrees needed for walking longer periods of time   Status Achieved   PT SHORT TERM GOAL #4   Title The patient will be independent in an intermediate level of core and hip strengthening program for the gym and home   Status Achieved  PT Long Term Goals - 10/08/15 1208    PT LONG TERM GOAL #1   Title The patient will be instructed in safe, self progression of HEP/gym program   Time 8   Period Weeks   Status On-going   PT LONG TERM GOAL #2   Title The patient will report a 50% improvement in pain and function with daily activities.     Time 8   Period Weeks   Status On-going   PT LONG TERM GOAL #3   Title Abdominal and trunk extensor strength improved to 4/5 needed for standing longer periods of time, kneeling   Time 8   Period Weeks   Status On-going   PT LONG TERM GOAL #4   Title Bilateral hip abduction strength improved to 4/5 needed for walking, standing community distances   Time 8   Period Weeks   Status On-going   PT LONG TERM GOAL #5   Title Modified Oswestry score improved from 44% to 34% indicating improved function with less pain   Time 8   Period Weeks   Status Achieved   PT LONG TERM GOAL #6   Title FOTO functional outcome score  improved from 53% limitation to 43% limitation indicating improved function with less pain   Time 8   Period Weeks   Status On-going               Plan - 10/08/15 1210    Clinical Impression Statement Good improvement with Modified Oswestry functional outcome score from 44% to 34%.  Patient is highly receptive and compliant with HEP with some minimal cues for proper technique/alignment.  Plan for discharge next visit if majority of goals met.     PT Next Visit Plan Plan for discharge next visit;  check hip and core strength; FOTO;  % improvement      G code Mobility walking moving around:  Goal CJ Discharge [place]  Visits from Start of Care: 8  Current functional level related to goals / functional outcomes: The patient called to cancel his last scheduled appointment.  Felt he was ready for discharge.  Discharge from PT at his request with majority of goals met.     Remaining deficits: See clinical impressions above  Education / Equipment: HEP Plan: Patient agrees to discharge.  Patient goals were partially met. Patient is being discharged due to the patient's request.  ?????        Problem List Patient Active Problem List   Diagnosis Date Noted  . Hematuria 07/09/2015  . Cataract 07/09/2015  . Medicare annual wellness visit, subsequent 10/21/2014  . SCC (squamous cell carcinoma) 10/21/2014  . BPH (benign prostatic hyperplasia) 04/15/2014  . Hyperlipidemia 04/15/2014  . Constipation 03/08/2013  . Elevated blood pressure 11/08/2012  . Arthralgia 09/05/2012  . Weight loss 09/05/2012  . Chronic back pain 09/05/2012    [name] 10/08/2015, 12:46 PM  [facility] [address] [place], [place], [address] Phone: [phone]   Fax:  [phone]  Name: Troy Mccann MRN: [id number] Date of Birth: Oct 12, 1941  Ruben Im, PT 10/08/2015 12:46 PM Phone: [phone] Fax:  [phone]

## 2015-10-10 ENCOUNTER — Ambulatory Visit: Payer: Medicare HMO | Admitting: Physical Therapy

## 2015-10-15 ENCOUNTER — Encounter: Payer: Medicare HMO | Admitting: Physical Therapy

## 2015-10-15 DIAGNOSIS — R972 Elevated prostate specific antigen [PSA]: Secondary | ICD-10-CM | POA: Diagnosis not present

## 2015-10-15 DIAGNOSIS — N400 Enlarged prostate without lower urinary tract symptoms: Secondary | ICD-10-CM | POA: Diagnosis not present

## 2015-10-22 DIAGNOSIS — R69 Illness, unspecified: Secondary | ICD-10-CM | POA: Diagnosis not present

## 2015-11-06 ENCOUNTER — Telehealth: Payer: Self-pay | Admitting: Family Medicine

## 2015-11-06 NOTE — Telephone Encounter (Signed)
lvm @ 11:53 for pt to schedule flu shot

## 2016-01-09 ENCOUNTER — Encounter: Payer: Self-pay | Admitting: Family Medicine

## 2016-01-09 ENCOUNTER — Ambulatory Visit (INDEPENDENT_AMBULATORY_CARE_PROVIDER_SITE_OTHER): Payer: Medicare HMO | Admitting: Family Medicine

## 2016-01-09 VITALS — BP 138/84 | HR 70 | Temp 97.8°F | Ht 66.0 in | Wt 148.5 lb

## 2016-01-09 DIAGNOSIS — IMO0001 Reserved for inherently not codable concepts without codable children: Secondary | ICD-10-CM

## 2016-01-09 DIAGNOSIS — N400 Enlarged prostate without lower urinary tract symptoms: Secondary | ICD-10-CM

## 2016-01-09 DIAGNOSIS — G8929 Other chronic pain: Secondary | ICD-10-CM | POA: Diagnosis not present

## 2016-01-09 DIAGNOSIS — E785 Hyperlipidemia, unspecified: Secondary | ICD-10-CM | POA: Diagnosis not present

## 2016-01-09 DIAGNOSIS — R030 Elevated blood-pressure reading, without diagnosis of hypertension: Secondary | ICD-10-CM | POA: Diagnosis not present

## 2016-01-09 DIAGNOSIS — M549 Dorsalgia, unspecified: Secondary | ICD-10-CM

## 2016-01-09 LAB — CBC
HCT: 44.1 % (ref 39.0–52.0)
HEMOGLOBIN: 14.4 g/dL (ref 13.0–17.0)
MCHC: 32.5 g/dL (ref 30.0–36.0)
MCV: 93.7 fl (ref 78.0–100.0)
Platelets: 218 10*3/uL (ref 150.0–400.0)
RBC: 4.71 Mil/uL (ref 4.22–5.81)
RDW: 13.7 % (ref 11.5–15.5)
WBC: 6.3 10*3/uL (ref 4.0–10.5)

## 2016-01-09 LAB — COMPREHENSIVE METABOLIC PANEL
ALT: 22 U/L (ref 0–53)
AST: 28 U/L (ref 0–37)
Albumin: 4.5 g/dL (ref 3.5–5.2)
Alkaline Phosphatase: 46 U/L (ref 39–117)
BILIRUBIN TOTAL: 1.2 mg/dL (ref 0.2–1.2)
BUN: 16 mg/dL (ref 6–23)
CO2: 30 meq/L (ref 19–32)
Calcium: 9.7 mg/dL (ref 8.4–10.5)
Chloride: 104 mEq/L (ref 96–112)
Creatinine, Ser: 0.76 mg/dL (ref 0.40–1.50)
GFR: 105.71 mL/min (ref >60.00)
GLUCOSE: 100 mg/dL — AB (ref 70–99)
Potassium: 4.6 mEq/L (ref 3.5–5.1)
Sodium: 140 mEq/L (ref 135–145)
Total Protein: 7.1 g/dL (ref 6.0–8.3)

## 2016-01-09 LAB — TSH: TSH: 1.32 u[IU]/mL (ref 0.35–4.50)

## 2016-01-09 NOTE — Progress Notes (Signed)
Pre visit review using our clinic review tool, if applicable. No additional management support is needed unless otherwise documented below in the visit note. 

## 2016-01-09 NOTE — Assessment & Plan Note (Signed)
PSA was last 5.8 with Dr Risa Grill, sees him again

## 2016-01-09 NOTE — Assessment & Plan Note (Signed)
Well controlled. Encouraged heart healthy diet such as the DASH diet and exercise as tolerated.  

## 2016-01-09 NOTE — Progress Notes (Signed)
Subjective:    Patient ID: Troy Mccann, male    DOB: [birth year], 75 y.o.   MRN: [id number]  Chief Complaint  Patient presents with  . Follow-up    HPI Patient is in today for follow up. Is doing well. Notes his PSA is being followed by urology, Dr Risa Grill and is on the rise. They are following closer now. No recent illness. His back pain continues to be a daily concern but he is managing fairly well. No incontinence. Denies CP/palp/SOB/HA/congestion/fevers/GI or GU c/o. Taking meds as prescribed  Past Medical History  Diagnosis Date  . Tick bite 2007    Never assessed by a physician.  Marland Kitchen Unspecified constipation 03/08/2013  . BPH (benign prostatic hyperplasia) 04/15/2014  . Other and unspecified hyperlipidemia 04/15/2014  . Medicare annual wellness visit, subsequent 10/21/2014  . SCC (squamous cell carcinoma) 10/21/2014  . Hematuria 07/09/2015  . Cataract 07/09/2015    No past surgical history on file.  Family History  Problem Relation Age of Onset  . Arthritis Mother     rheumatoid  . Heart disease Mother     rheumatic fever  . Hypertension Mother   . Heart disease Father     MI  . Dementia Father   . Hypertension Sister   . Hypertension Son   . Obesity Son   . Aneurysm Maternal Grandmother   . Dementia Maternal Grandfather   . Heart disease Paternal Grandmother     MI  . Kidney disease Paternal Grandfather     bright's disease    Social History   Social History  . Marital Status: Widowed    Spouse Name: N/A  . Number of Children: N/A  . Years of Education: N/A   Occupational History  . Not on file.   Social History Main Topics  . Smoking status: Former Research scientist (life sciences)  . Smokeless tobacco: Not on file  . Alcohol Use: Yes     Comment: rare  . Drug Use: No  . Sexual Activity: Not on file   Other Topics Concern  . Not on file   Social History Narrative    Outpatient Prescriptions Prior to Visit  Medication Sig Dispense Refill  . Ascorbic Acid (VITAMIN C)  POWD Take 333 mg by mouth daily. Emergen-C-take 1/3 packet daily.    . Camphor-Menthol-Methyl Sal (TIGER BALM MUSCLE RUB EX) Apply topically at bedtime.    . Cholecalciferol (VITAMIN D-3) 1000 UNITS CAPS Take by mouth daily.     No facility-administered medications prior to visit.    Allergies  Allergen Reactions  . Yellow Jacket Venom [Bee Venom] Hives    Review of Systems  Constitutional: Negative for fever and malaise/fatigue.  HENT: Negative for congestion.   Eyes: Negative for discharge.  Respiratory: Negative for shortness of breath.   Cardiovascular: Negative for chest pain, palpitations and leg swelling.  Gastrointestinal: Negative for nausea and abdominal pain.  Genitourinary: Negative for dysuria.  Musculoskeletal: Positive for back pain. Negative for falls.  Skin: Negative for rash.  Neurological: Negative for loss of consciousness and headaches.  Endo/Heme/Allergies: Negative for environmental allergies.  Psychiatric/Behavioral: Negative for depression. The patient is not nervous/anxious.        Objective:    Physical Exam  Constitutional: He is oriented to person, place, and time. He appears well-developed and well-nourished. No distress.  HENT:  Head: Normocephalic and atraumatic.  Nose: Nose normal.  Eyes: Right eye exhibits no discharge. Left eye exhibits no discharge.  Neck: Normal range of  motion. Neck supple.  Cardiovascular: Normal rate and regular rhythm.   No murmur heard. Pulmonary/Chest: Effort normal and breath sounds normal.  Abdominal: Soft. Bowel sounds are normal. There is no tenderness.  Musculoskeletal: He exhibits no edema.  Neurological: He is alert and oriented to person, place, and time.  Skin: Skin is warm and dry.  Psychiatric: He has a normal mood and affect.  Nursing note and vitals reviewed.   BP 138/84 mmHg  Pulse 70  Temp(Src) 97.8 F (36.6 C) (Oral)  Ht 5\' 6"  (1.676 m)  Wt 148 lb 8 oz (67.359 kg)  BMI 23.98 kg/m2  SpO2  96% Wt Readings from Last 3 Encounters:  01/09/16 148 lb 8 oz (67.359 kg)  07/09/15 151 lb 4 oz (68.607 kg)  10/15/14 153 lb 3.2 oz (69.491 kg)     Lab Results  Component Value Date   WBC 6.3 01/09/2016   HGB 14.4 01/09/2016   HCT 44.1 01/09/2016   PLT 218.0 01/09/2016   GLUCOSE 100* 01/09/2016   CHOL 156 07/09/2015   TRIG 77.0 07/09/2015   HDL 43.50 07/09/2015   LDLCALC 97 07/09/2015   ALT 22 01/09/2016   AST 28 01/09/2016   NA 140 01/09/2016   K 4.6 01/09/2016   CL 104 01/09/2016   CREATININE 0.76 01/09/2016   BUN 16 01/09/2016   CO2 30 01/09/2016   TSH 1.32 01/09/2016   PSA 4.62* 10/15/2014    Lab Results  Component Value Date   TSH 1.32 01/09/2016   Lab Results  Component Value Date   WBC 6.3 01/09/2016   HGB 14.4 01/09/2016   HCT 44.1 01/09/2016   MCV 93.7 01/09/2016   PLT 218.0 01/09/2016   Lab Results  Component Value Date   NA 140 01/09/2016   K 4.6 01/09/2016   CO2 30 01/09/2016   GLUCOSE 100* 01/09/2016   BUN 16 01/09/2016   CREATININE 0.76 01/09/2016   BILITOT 1.2 01/09/2016   ALKPHOS 46 01/09/2016   AST 28 01/09/2016   ALT 22 01/09/2016   PROT 7.1 01/09/2016   ALBUMIN 4.5 01/09/2016   CALCIUM 9.7 01/09/2016   GFR 105.71 01/09/2016   Lab Results  Component Value Date   CHOL 156 07/09/2015   Lab Results  Component Value Date   HDL 43.50 07/09/2015   Lab Results  Component Value Date   LDLCALC 97 07/09/2015   Lab Results  Component Value Date   TRIG 77.0 07/09/2015   Lab Results  Component Value Date   CHOLHDL 4 07/09/2015   No results found for: HGBA1C     Assessment & Plan:   Problem List Items Addressed This Visit    BPH (benign prostatic hyperplasia)    PSA was last 5.8 with Dr Risa Grill, sees him again       Chronic back pain    Follows with Raliegh Ip and they sent him to interventionalist, Dr Skip Mayer He has undergone some rehab with decent results.  Continues to have pain but it is persistent. He has a follow  up with Dr Skip Mayer on 2/14 to discuss possible CT and discussion of plans for future care.      Elevated blood pressure - Primary    Well controlled.. Encouraged heart healthy diet such as the DASH diet and exercise as tolerated.       Relevant Orders   CBC (Completed)   Comprehensive metabolic panel (Completed)   TSH (Completed)      I am having Mr. Gersh  maintain his Vitamin D-3, Camphor-Menthol-Methyl Sal (TIGER BALM MUSCLE RUB EX), Vitamin C, and B Complex-C (SUPER B COMPLEX PO).  Meds ordered this encounter  Medications  . B Complex-C (SUPER B COMPLEX PO)    Sig: Take by mouth daily.     Willette Alma, MD

## 2016-01-09 NOTE — Patient Instructions (Addendum)
Yellow split pee powder protein               [email or web address]  `Hypertension Hypertension, commonly called high blood pressure, is when the force of blood pumping through your arteries is too strong. Your arteries are the blood vessels that carry blood from your heart throughout your body. A blood pressure reading consists of a higher number over a lower number, such as 110/72. The higher number (systolic) is the pressure inside your arteries when your heart pumps. The lower number (diastolic) is the pressure inside your arteries when your heart relaxes. Ideally you want your blood pressure below 120/80. Hypertension forces your heart to work harder to pump blood. Your arteries may become narrow or stiff. Having untreated or uncontrolled hypertension can cause heart attack, stroke, kidney disease, and other problems. RISK FACTORS Some risk factors for high blood pressure are controllable. Others are not.  Risk factors you cannot control include:   Race. You may be at higher risk if you are African American.  Age. Risk increases with age.  Gender. Men are at higher risk than women before age 54 years. After age 52, women are at higher risk than men. Risk factors you can control include:  Not getting enough exercise or physical activity.  Being overweight.  Getting too much fat, sugar, calories, or salt in your diet.  Drinking too much alcohol. SIGNS AND SYMPTOMS Hypertension does not usually cause signs or symptoms. Extremely high blood pressure (hypertensive crisis) may cause headache, anxiety, shortness of breath, and nosebleed. DIAGNOSIS To check if you have hypertension, your health care provider will measure your blood pressure while you are seated, with your arm held at the level of your heart. It should be measured at least twice using the same arm. Certain conditions can cause a difference in blood pressure between your right and left arms. A blood pressure reading that is higher  than normal on one occasion does not mean that you need treatment. If it is not clear whether you have high blood pressure, you may be asked to return on a different day to have your blood pressure checked again. Or, you may be asked to monitor your blood pressure at home for 1 or more weeks. TREATMENT Treating high blood pressure includes making lifestyle changes and possibly taking medicine. Living a healthy lifestyle can help lower high blood pressure. You may need to change some of your habits. Lifestyle changes may include:  Following the DASH diet. This diet is high in fruits, vegetables, and whole grains. It is low in salt, red meat, and added sugars.  Keep your sodium intake below 2,300 mg per day.  Getting at least 30-45 minutes of aerobic exercise at least 4 times per week.  Losing weight if necessary.  Not smoking.  Limiting alcoholic beverages.  Learning ways to reduce stress. Your health care provider may prescribe medicine if lifestyle changes are not enough to get your blood pressure under control, and if one of the following is true:  You are 29-71 years of age and your systolic blood pressure is above 140.  You are 9 years of age or older, and your systolic blood pressure is above 150.  Your diastolic blood pressure is above 90.  You have diabetes, and your systolic blood pressure is over [id number] or your diastolic blood pressure is over 90.  You have kidney disease and your blood pressure is above 140/90.  You have heart disease and your blood pressure is above  140/90. Your personal target blood pressure may vary depending on your medical conditions, your age, and other factors. HOME CARE INSTRUCTIONS  Have your blood pressure rechecked as directed by your health care provider.   Take medicines only as directed by your health care provider. Follow the directions carefully. Blood pressure medicines must be taken as prescribed. The medicine does not work as well when  you skip doses. Skipping doses also puts you at risk for problems.  Do not smoke.   Monitor your blood pressure at home as directed by your health care provider. SEEK MEDICAL CARE IF:   You think you are having a reaction to medicines taken.  You have recurrent headaches or feel dizzy.  You have swelling in your ankles.  You have trouble with your vision. SEEK IMMEDIATE MEDICAL CARE IF:  You develop a severe headache or confusion.  You have unusual weakness, numbness, or feel faint.  You have severe chest or abdominal pain.  You vomit repeatedly.  You have trouble breathing. MAKE SURE YOU:   Understand these instructions.  Will watch your condition.  Will get help right away if you are not doing well or get worse.   This information is not intended to replace advice given to you by your health care provider. Make sure you discuss any questions you have with your health care provider.   Document Released: 11/23/2005 Document Revised: 04/09/2015 Document Reviewed: 09/15/2013 Elsevier Interactive Patient Education [employer].

## 2016-01-09 NOTE — Assessment & Plan Note (Signed)
Follows with Raliegh Ip and they sent him to interventionalist, Dr Skip Mayer He has undergone some rehab with decent results.  Continues to have pain but it is persistent. He has a follow up with Dr Skip Mayer on 2/14 to discuss possible CT and discussion of plans for future care.

## 2016-01-12 ENCOUNTER — Encounter: Payer: Self-pay | Admitting: Family Medicine

## 2016-01-21 DIAGNOSIS — M546 Pain in thoracic spine: Secondary | ICD-10-CM | POA: Diagnosis not present

## 2016-01-21 DIAGNOSIS — M545 Low back pain: Secondary | ICD-10-CM | POA: Diagnosis not present

## 2016-01-21 DIAGNOSIS — M4806 Spinal stenosis, lumbar region: Secondary | ICD-10-CM | POA: Diagnosis not present

## 2016-01-21 DIAGNOSIS — M4120 Other idiopathic scoliosis, site unspecified: Secondary | ICD-10-CM | POA: Diagnosis not present

## 2016-02-24 DIAGNOSIS — L570 Actinic keratosis: Secondary | ICD-10-CM | POA: Diagnosis not present

## 2016-02-24 DIAGNOSIS — Z08 Encounter for follow-up examination after completed treatment for malignant neoplasm: Secondary | ICD-10-CM | POA: Diagnosis not present

## 2016-02-24 DIAGNOSIS — Z85828 Personal history of other malignant neoplasm of skin: Secondary | ICD-10-CM | POA: Diagnosis not present

## 2016-04-06 DIAGNOSIS — R972 Elevated prostate specific antigen [PSA]: Secondary | ICD-10-CM | POA: Diagnosis not present

## 2016-04-13 DIAGNOSIS — Z0000 Encounter for general adult medical examination without abnormal findings: Secondary | ICD-10-CM | POA: Diagnosis not present

## 2016-04-13 DIAGNOSIS — N401 Enlarged prostate with lower urinary tract symptoms: Secondary | ICD-10-CM | POA: Diagnosis not present

## 2016-04-13 DIAGNOSIS — R3912 Poor urinary stream: Secondary | ICD-10-CM | POA: Diagnosis not present

## 2016-04-13 DIAGNOSIS — R972 Elevated prostate specific antigen [PSA]: Secondary | ICD-10-CM | POA: Diagnosis not present

## 2016-04-13 DIAGNOSIS — N138 Other obstructive and reflux uropathy: Secondary | ICD-10-CM | POA: Diagnosis not present

## 2016-04-30 DIAGNOSIS — R69 Illness, unspecified: Secondary | ICD-10-CM | POA: Diagnosis not present

## 2016-07-09 ENCOUNTER — Ambulatory Visit (INDEPENDENT_AMBULATORY_CARE_PROVIDER_SITE_OTHER): Payer: Medicare HMO | Admitting: Family Medicine

## 2016-07-09 ENCOUNTER — Encounter: Payer: Self-pay | Admitting: Family Medicine

## 2016-07-09 VITALS — BP 138/74 | HR 71 | Temp 98.3°F | Ht 66.0 in | Wt 143.8 lb

## 2016-07-09 DIAGNOSIS — E785 Hyperlipidemia, unspecified: Secondary | ICD-10-CM | POA: Diagnosis not present

## 2016-07-09 DIAGNOSIS — IMO0001 Reserved for inherently not codable concepts without codable children: Secondary | ICD-10-CM

## 2016-07-09 DIAGNOSIS — R634 Abnormal weight loss: Secondary | ICD-10-CM

## 2016-07-09 DIAGNOSIS — K5900 Constipation, unspecified: Secondary | ICD-10-CM

## 2016-07-09 DIAGNOSIS — R030 Elevated blood-pressure reading, without diagnosis of hypertension: Secondary | ICD-10-CM | POA: Diagnosis not present

## 2016-07-09 LAB — COMPREHENSIVE METABOLIC PANEL
ALK PHOS: 42 U/L (ref 39–117)
ALT: 21 U/L (ref 0–53)
AST: 26 U/L (ref 0–37)
Albumin: 4.7 g/dL (ref 3.5–5.2)
BUN: 21 mg/dL (ref 6–23)
CO2: 27 meq/L (ref 19–32)
Calcium: 9.5 mg/dL (ref 8.4–10.5)
Chloride: 103 mEq/L (ref 96–112)
Creatinine, Ser: 0.71 mg/dL (ref 0.40–1.50)
GFR: 114.2 mL/min (ref >60.00)
GLUCOSE: 87 mg/dL (ref 70–99)
POTASSIUM: 3.8 meq/L (ref 3.5–5.1)
SODIUM: 138 meq/L (ref 135–145)
TOTAL PROTEIN: 7.3 g/dL (ref 6.0–8.3)
Total Bilirubin: 1 mg/dL (ref 0.2–1.2)

## 2016-07-09 LAB — CBC
HCT: 42.7 % (ref 39.0–52.0)
HEMOGLOBIN: 14.4 g/dL (ref 13.0–17.0)
MCHC: 33.7 g/dL (ref 30.0–36.0)
MCV: 92 fl (ref 78.0–100.0)
Platelets: 204 10*3/uL (ref 150.0–400.0)
RBC: 4.64 Mil/uL (ref 4.22–5.81)
RDW: 13.2 % (ref 11.5–15.5)
WBC: 6.5 10*3/uL (ref 4.0–10.5)

## 2016-07-09 LAB — TSH: TSH: 1.4 u[IU]/mL (ref 0.35–4.50)

## 2016-07-09 NOTE — Assessment & Plan Note (Signed)
Encouraged heart healthy diet, increase exercise, avoid trans fats, consider a krill oil cap daily 

## 2016-07-09 NOTE — Assessment & Plan Note (Signed)
Well controlled, no changes to meds. Encouraged heart healthy diet such as the DASH diet and exercise as tolerated.  °

## 2016-07-09 NOTE — Progress Notes (Signed)
Patient ID: Troy Mccann, male   DOB: 1941/08/08, 75 y.o.   MRN: [id number]    Subjective:    Patient ID: Troy Mccann, male    DOB: [birth year], 75 y.o.   MRN: [id number]  Chief Complaint  Patient presents with  . Follow-up    pt. here for 6 month follow-up    HPI Patient is in today for follow up. He is feeling well today. No recent illness or hospitalization. He is feeling well and eating better. Denies CP/palp/SOB/HA/congestion/fevers/GI or GU c/o. Taking meds as prescribed  Past Medical History:  Diagnosis Date  . BPH (benign prostatic hyperplasia) 04/15/2014  . Cataract 07/09/2015  . Hematuria 07/09/2015  . Medicare annual wellness visit, subsequent 10/21/2014  . Other and unspecified hyperlipidemia 04/15/2014  . SCC (squamous cell carcinoma) 10/21/2014  . Tick bite 2007   Never assessed by a physician.  Marland Kitchen Unspecified constipation 03/08/2013    No past surgical history on file.  Family History  Problem Relation Age of Onset  . Arthritis Mother     rheumatoid  . Heart disease Mother     rheumatic fever  . Hypertension Mother   . Heart disease Father     MI  . Dementia Father   . Hypertension Sister   . Hypertension Son   . Obesity Son   . Aneurysm Maternal Grandmother   . Dementia Maternal Grandfather   . Heart disease Paternal Grandmother     MI  . Kidney disease Paternal Grandfather     bright's disease    Social History   Social History  . Marital status: Widowed    Spouse name: N/A  . Number of children: N/A  . Years of education: N/A   Occupational History  . Not on file.   Social History Main Topics  . Smoking status: Former Research scientist (life sciences)  . Smokeless tobacco: Not on file  . Alcohol use Yes     Comment: rare  . Drug use: No  . Sexual activity: Not on file   Other Topics Concern  . Not on file   Social History Narrative  . No narrative on file    Outpatient Medications Prior to Visit  Medication Sig Dispense Refill  . Ascorbic Acid (VITAMIN C)  POWD Take 333 mg by mouth daily. Emergen-C-take 1/3 packet daily.    . B Complex-C (SUPER B COMPLEX PO) Take by mouth daily.    . Camphor-Menthol-Methyl Sal (TIGER BALM MUSCLE RUB EX) Apply topically at bedtime.    . Cholecalciferol (VITAMIN D-3) 1000 UNITS CAPS Take by mouth daily.     No facility-administered medications prior to visit.     Allergies  Allergen Reactions  . Yellow Jacket Venom [Bee Venom] Hives    Review of Systems  Constitutional: Negative for fever and malaise/fatigue.  HENT: Negative for congestion.   Eyes: Negative for blurred vision.  Respiratory: Negative for shortness of breath.   Cardiovascular: Negative for chest pain, palpitations and leg swelling.  Gastrointestinal: Negative for abdominal pain, blood in stool and nausea.  Genitourinary: Negative for dysuria and frequency.  Musculoskeletal: Positive for back pain. Negative for falls.  Skin: Negative for rash.  Neurological: Negative for dizziness, loss of consciousness and headaches.  Endo/Heme/Allergies: Negative for environmental allergies.  Psychiatric/Behavioral: Negative for depression. The patient is not nervous/anxious.        Objective:    Physical Exam  Constitutional: He is oriented to person, place, and time. He appears well-developed and well-nourished. No distress.  HENT:  Head: Normocephalic and atraumatic.  Nose: Nose normal.  Eyes: Right eye exhibits no discharge. Left eye exhibits no discharge.  Neck: Normal range of motion. Neck supple.  Cardiovascular: Normal rate and regular rhythm.   No murmur heard. Pulmonary/Chest: Effort normal and breath sounds normal.  Abdominal: Soft. Bowel sounds are normal. There is no tenderness.  Musculoskeletal: He exhibits no edema.  Neurological: He is alert and oriented to person, place, and time.  Skin: Skin is warm and dry.  Psychiatric: He has a normal mood and affect.  Nursing note and vitals reviewed.   BP 138/74 (BP Location: Left  Arm)   Pulse 71   Temp 98.3 F (36.8 C) (Oral)   Ht 5\' 6"  (1.676 m)   Wt 143 lb 12.8 oz (65.2 kg)   BMI 23.21 kg/m  Wt Readings from Last 3 Encounters:  07/09/16 143 lb 12.8 oz (65.2 kg)  01/09/16 148 lb 8 oz (67.4 kg)  07/09/15 151 lb 4 oz (68.6 kg)     Lab Results  Component Value Date   WBC 6.5 07/09/2016   HGB 14.4 07/09/2016   HCT 42.7 07/09/2016   PLT 204.0 07/09/2016   GLUCOSE 87 07/09/2016   CHOL 156 07/09/2015   TRIG 77.0 07/09/2015   HDL 43.50 07/09/2015   LDLCALC 97 07/09/2015   ALT 21 07/09/2016   AST 26 07/09/2016   NA 138 07/09/2016   K 3.8 07/09/2016   CL 103 07/09/2016   CREATININE 0.71 07/09/2016   BUN 21 07/09/2016   CO2 27 07/09/2016   TSH 1.40 07/09/2016   PSA 4.62 (H) 10/15/2014    Lab Results  Component Value Date   TSH 1.40 07/09/2016   Lab Results  Component Value Date   WBC 6.5 07/09/2016   HGB 14.4 07/09/2016   HCT 42.7 07/09/2016   MCV 92.0 07/09/2016   PLT 204.0 07/09/2016   Lab Results  Component Value Date   NA 138 07/09/2016   K 3.8 07/09/2016   CO2 27 07/09/2016   GLUCOSE 87 07/09/2016   BUN 21 07/09/2016   CREATININE 0.71 07/09/2016   BILITOT 1.0 07/09/2016   ALKPHOS 42 07/09/2016   AST 26 07/09/2016   ALT 21 07/09/2016   PROT 7.3 07/09/2016   ALBUMIN 4.7 07/09/2016   CALCIUM 9.5 07/09/2016   GFR 114.20 07/09/2016   Lab Results  Component Value Date   CHOL 156 07/09/2015   Lab Results  Component Value Date   HDL 43.50 07/09/2015   Lab Results  Component Value Date   LDLCALC 97 07/09/2015   Lab Results  Component Value Date   TRIG 77.0 07/09/2015   Lab Results  Component Value Date   CHOLHDL 4 07/09/2015   No results found for: HGBA1C     Assessment & Plan:   Problem List Items Addressed This Visit    Weight loss - Primary   Relevant Orders   TSH (Completed)   CBC (Completed)   Comprehensive metabolic panel (Completed)   Elevated blood pressure    Well controlled, no changes to meds.  Encouraged heart healthy diet such as the DASH diet and exercise as tolerated.       Relevant Orders   TSH (Completed)   CBC (Completed)   Comprehensive metabolic panel (Completed)   Constipation    Encouraged increased hydration and fiber in diet. Daily probiotics. If bowels not moving can use MOM 2 tbls po in 4 oz of warm prune juice by mouth  every 2-3 days. If no results then repeat in 4 hours with  Dulcolax suppository pr, may repeat again in 4 more hours as needed. Seek care if symptoms worsen      Hyperlipidemia    Encouraged heart healthy diet, increase exercise, avoid trans fats, consider a krill oil cap daily      Relevant Orders   TSH (Completed)   CBC (Completed)   Comprehensive metabolic panel (Completed)    Other Visit Diagnoses   None.     I am having Mr. Jason maintain his Vitamin D-3, Camphor-Menthol-Methyl Sal (TIGER BALM MUSCLE RUB EX), Vitamin C, and B Complex-C (SUPER B COMPLEX PO).  No orders of the defined types were placed in this encounter.    Saman Giddens, MDA

## 2016-07-09 NOTE — Patient Instructions (Signed)

## 2016-07-19 NOTE — Assessment & Plan Note (Signed)
Encouraged increased hydration and fiber in diet. Daily probiotics. If bowels not moving can use MOM 2 tbls po in 4 oz of warm prune juice by mouth every 2-3 days. If no results then repeat in 4 hours with  Dulcolax suppository pr, may repeat again in 4 more hours as needed. Seek care if symptoms worsen 

## 2016-08-28 DIAGNOSIS — L821 Other seborrheic keratosis: Secondary | ICD-10-CM | POA: Diagnosis not present

## 2016-08-28 DIAGNOSIS — C44311 Basal cell carcinoma of skin of nose: Secondary | ICD-10-CM | POA: Diagnosis not present

## 2016-08-28 DIAGNOSIS — D485 Neoplasm of uncertain behavior of skin: Secondary | ICD-10-CM | POA: Diagnosis not present

## 2016-11-18 DIAGNOSIS — R69 Illness, unspecified: Secondary | ICD-10-CM | POA: Diagnosis not present

## 2016-11-19 DIAGNOSIS — C44311 Basal cell carcinoma of skin of nose: Secondary | ICD-10-CM | POA: Diagnosis not present

## 2017-01-12 ENCOUNTER — Ambulatory Visit (INDEPENDENT_AMBULATORY_CARE_PROVIDER_SITE_OTHER): Payer: Medicare HMO | Admitting: Family Medicine

## 2017-01-12 ENCOUNTER — Encounter: Payer: Self-pay | Admitting: Family Medicine

## 2017-01-12 DIAGNOSIS — K5900 Constipation, unspecified: Secondary | ICD-10-CM

## 2017-01-12 DIAGNOSIS — E785 Hyperlipidemia, unspecified: Secondary | ICD-10-CM

## 2017-01-12 DIAGNOSIS — N400 Enlarged prostate without lower urinary tract symptoms: Secondary | ICD-10-CM | POA: Diagnosis not present

## 2017-01-12 DIAGNOSIS — R972 Elevated prostate specific antigen [PSA]: Secondary | ICD-10-CM

## 2017-01-12 DIAGNOSIS — C44310 Basal cell carcinoma of skin of unspecified parts of face: Secondary | ICD-10-CM

## 2017-01-12 HISTORY — DX: Elevated prostate specific antigen (PSA): R97.20

## 2017-01-12 HISTORY — DX: Basal cell carcinoma of skin of unspecified parts of face: C44.310

## 2017-01-12 NOTE — Patient Instructions (Signed)
[place], elderberry, vitamin C, aged or black garlic, zinc, NOW probiotics Constipation, Adult Constipation is when a person:  Poops (has a bowel movement) fewer times in a week than normal.  Has a hard time pooping.  Has poop that is dry, hard, or bigger than normal. Follow these instructions at home: Eating and drinking  Eat foods that have a lot of fiber, such as:  Fresh fruits and vegetables.  Whole grains.  Beans.  Eat less of foods that are high in fat, low in fiber, or overly processed, such as:  [place] fries.  Hamburgers.  Cookies.  Candy.  Soda.  Drink enough fluid to keep your pee (urine) clear or pale yellow. General instructions  Exercise regularly or as told by your doctor.  Go to the restroom when you feel like you need to poop. Do not hold it in.  Take over-the-counter and prescription medicines only as told by your doctor. These include any fiber supplements.  Do pelvic floor retraining exercises, such as:  Doing deep breathing while relaxing your lower belly (abdomen).  Relaxing your pelvic floor while pooping.  Watch your condition for any changes.  Keep all follow-up visits as told by your doctor. This is important. Contact a doctor if:  You have pain that gets worse.  You have a fever.  You have not pooped for 4 days.  You throw up (vomit).  You are not hungry.  You lose weight.  You are bleeding from the anus.  You have thin, pencil-like poop (stool). Get help right away if:  You have a fever, and your symptoms suddenly get worse.  You leak poop or have blood in your poop.  Your belly feels hard or bigger than normal (is bloated).  You have very bad belly pain.  You feel dizzy or you faint. This information is not intended to replace advice given to you by your health care provider. Make sure you discuss any questions you have with your health care provider. Document Released: 05/11/2008 Document Revised: 06/12/2016  Document Reviewed: 05/13/2016 Elsevier Interactive Patient Education  2017 [employer].  for infections

## 2017-01-12 NOTE — Assessment & Plan Note (Signed)
No new symptoms or acute concerns at this time.

## 2017-01-12 NOTE — Progress Notes (Signed)
Patient ID: Troy Mccann, male   DOB: 01-23-1941, 76 y.o.   MRN: [id number]   Subjective:    Patient ID: Troy Mccann, male    DOB: 1941-03-10, 76 y.o.   MRN: [id number]  Chief Complaint  Patient presents with  . Follow-up  . Hyperlipidemia   I acted as a Education administrator for Dr. Charlett Blake. Princess, RMA  Hyperlipidemia  This is a chronic problem. The problem is controlled. Recent lipid tests were reviewed and are normal. There are no known factors aggravating his hyperlipidemia. Pertinent negatives include no chest pain or shortness of breath. There are no compliance problems.  There are no known risk factors for coronary artery disease.    Patient is in today for follow up. He is feeling well today. No recent illness or acute concerns. No recent hospitalizations. He had a Mohs procedure on his nose for BCC. He has heeled well. Denies CP/palp/SOB/HA/congestion/fevers/GI or GU c/o. Taking meds as prescribed  Past Medical History:  Diagnosis Date  . Abnormal PSA 01/12/2017  . BCC (basal cell carcinoma), face 01/12/2017  . BPH (benign prostatic hyperplasia) 04/15/2014  . Cataract 07/09/2015  . Hematuria 07/09/2015  . Medicare annual wellness visit, subsequent 10/21/2014  . Other and unspecified hyperlipidemia 04/15/2014  . SCC (squamous cell carcinoma) 10/21/2014  . Tick bite 2007   Never assessed by a physician.  Marland Kitchen Unspecified constipation 03/08/2013    No past surgical history on file.  Family History  Problem Relation Age of Onset  . Arthritis Mother     rheumatoid  . Heart disease Mother     rheumatic fever  . Hypertension Mother   . Heart disease Father     MI  . Dementia Father   . Hypertension Sister   . Hypertension Son   . Obesity Son   . Aneurysm Maternal Grandmother   . Dementia Maternal Grandfather   . Heart disease Paternal Grandmother     MI  . Kidney disease Paternal Grandfather     bright's disease    Social History   Social History  . Marital status: Widowed    Spouse  name: N/A  . Number of children: N/A  . Years of education: N/A   Occupational History  . Not on file.   Social History Main Topics  . Smoking status: Former Research scientist (life sciences)  . Smokeless tobacco: Never Used  . Alcohol use Yes     Comment: rare  . Drug use: No  . Sexual activity: Not on file   Other Topics Concern  . Not on file   Social History Narrative  . No narrative on file    Outpatient Medications Prior to Visit  Medication Sig Dispense Refill  . Ascorbic Acid (VITAMIN C) POWD Take 333 mg by mouth daily. Emergen-C-take 1/3 packet daily.    . B Complex-C (SUPER B COMPLEX PO) Take by mouth daily.    . Camphor-Menthol-Methyl Sal (TIGER BALM MUSCLE RUB EX) Apply topically at bedtime.    . Cholecalciferol (VITAMIN D-3) 1000 UNITS CAPS Take by mouth daily.     No facility-administered medications prior to visit.     Allergies  Allergen Reactions  . Yellow Jacket Venom [Bee Venom] Hives    Review of Systems  Constitutional: Negative for fever and malaise/fatigue.  HENT: Negative for congestion.   Eyes: Negative for blurred vision.  Respiratory: Negative for cough and shortness of breath.   Cardiovascular: Negative for chest pain, palpitations and leg swelling.  Gastrointestinal: Negative for vomiting.  Musculoskeletal: Negative for back pain.  Skin: Negative for rash.  Neurological: Negative for loss of consciousness and headaches.       Objective:    Physical Exam  Constitutional: He is oriented to person, place, and time. He appears well-developed and well-nourished. No distress.  HENT:  Head: Normocephalic and atraumatic.  Eyes: Conjunctivae are normal.  Neck: Normal range of motion. No thyromegaly present.  Cardiovascular: Normal rate and regular rhythm.   Pulmonary/Chest: Effort normal and breath sounds normal. He has no wheezes.  Abdominal: Soft. Bowel sounds are normal. There is no tenderness.  Musculoskeletal: Normal range of motion. He exhibits no edema or  deformity.  Neurological: He is alert and oriented to person, place, and time.  Skin: Skin is warm and dry. He is not diaphoretic.  Psychiatric: He has a normal mood and affect.    BP 130/76 (BP Location: Left Arm, Patient Position: Sitting, Cuff Size: Normal)   Pulse 69   Temp 97.9 F (36.6 C) (Oral)   Wt 150 lb 3.2 oz (68.1 kg)   SpO2 96%   BMI 24.24 kg/m  Wt Readings from Last 3 Encounters:  01/12/17 150 lb 3.2 oz (68.1 kg)  07/09/16 143 lb 12.8 oz (65.2 kg)  01/09/16 148 lb 8 oz (67.4 kg)     Lab Results  Component Value Date   WBC 6.5 07/09/2016   HGB 14.4 07/09/2016   HCT 42.7 07/09/2016   PLT 204.0 07/09/2016   GLUCOSE 87 07/09/2016   CHOL 156 07/09/2015   TRIG 77.0 07/09/2015   HDL 43.50 07/09/2015   LDLCALC 97 07/09/2015   ALT 21 07/09/2016   AST 26 07/09/2016   NA 138 07/09/2016   K 3.8 07/09/2016   CL 103 07/09/2016   CREATININE 0.71 07/09/2016   BUN 21 07/09/2016   CO2 27 07/09/2016   TSH 1.40 07/09/2016   PSA 4.62 (H) 10/15/2014    Lab Results  Component Value Date   TSH 1.40 07/09/2016   Lab Results  Component Value Date   WBC 6.5 07/09/2016   HGB 14.4 07/09/2016   HCT 42.7 07/09/2016   MCV 92.0 07/09/2016   PLT 204.0 07/09/2016   Lab Results  Component Value Date   NA 138 07/09/2016   K 3.8 07/09/2016   CO2 27 07/09/2016   GLUCOSE 87 07/09/2016   BUN 21 07/09/2016   CREATININE 0.71 07/09/2016   BILITOT 1.0 07/09/2016   ALKPHOS 42 07/09/2016   AST 26 07/09/2016   ALT 21 07/09/2016   PROT 7.3 07/09/2016   ALBUMIN 4.7 07/09/2016   CALCIUM 9.5 07/09/2016   GFR 114.20 07/09/2016   Lab Results  Component Value Date   CHOL 156 07/09/2015   Lab Results  Component Value Date   HDL 43.50 07/09/2015   Lab Results  Component Value Date   LDLCALC 97 07/09/2015   Lab Results  Component Value Date   TRIG 77.0 07/09/2015   Lab Results  Component Value Date   CHOLHDL 4 07/09/2015   No results found for: HGBA1C       Assessment & Plan:   Problem List Items Addressed This Visit    Constipation    Doing much better on probiotics. Encouraged increased hydration and fiber in diet.       BPH (benign prostatic hyperplasia)    No new symptoms or acute concerns at this time.      Hyperlipidemia    Encouraged heart healthy diet, increase exercise, avoid trans fats,  consider a krill oil cap daily      BCC (basal cell carcinoma), face    Left side of nose diagnosed with BCC in September 2017, Mohs Dec 14 went well, has dermabrasion on 01/18/2017      Abnormal PSA      I am having Mr. Fazzolari maintain his Vitamin D-3, Camphor-Menthol-Methyl Sal (TIGER BALM MUSCLE RUB EX), Vitamin C, and B Complex-C (SUPER B COMPLEX PO).  No orders of the defined types were placed in this encounter.   CMA served as Education administrator during this visit. History, Physical and Plan performed by medical provider. Documentation and orders reviewed and attested to.  Penni Homans, MD

## 2017-01-12 NOTE — Assessment & Plan Note (Signed)
Left side of nose diagnosed with [facility] in September 2017, Mohs Dec 14 went well, has dermabrasion on 01/18/2017

## 2017-01-12 NOTE — Progress Notes (Signed)
Pre visit review using our clinic review tool, if applicable. No additional management support is needed unless otherwise documented below in the visit note. 

## 2017-01-12 NOTE — Assessment & Plan Note (Signed)
Doing much better on probiotics. Encouraged increased hydration and fiber in diet.

## 2017-01-17 ENCOUNTER — Encounter: Payer: Self-pay | Admitting: Family Medicine

## 2017-01-17 NOTE — Assessment & Plan Note (Signed)
Minimize sodium and caffeine.

## 2017-01-17 NOTE — Assessment & Plan Note (Signed)
Encouraged heart healthy diet, increase exercise, avoid trans fats, consider a krill oil cap daily 

## 2017-02-22 DIAGNOSIS — Z85828 Personal history of other malignant neoplasm of skin: Secondary | ICD-10-CM | POA: Diagnosis not present

## 2017-02-22 DIAGNOSIS — L570 Actinic keratosis: Secondary | ICD-10-CM | POA: Diagnosis not present

## 2017-02-22 DIAGNOSIS — Z08 Encounter for follow-up examination after completed treatment for malignant neoplasm: Secondary | ICD-10-CM | POA: Diagnosis not present

## 2017-07-06 ENCOUNTER — Other Ambulatory Visit (INDEPENDENT_AMBULATORY_CARE_PROVIDER_SITE_OTHER): Payer: Medicare HMO

## 2017-07-06 ENCOUNTER — Other Ambulatory Visit: Payer: Self-pay | Admitting: Emergency Medicine

## 2017-07-06 DIAGNOSIS — Z0000 Encounter for general adult medical examination without abnormal findings: Secondary | ICD-10-CM

## 2017-07-06 LAB — CBC
HCT: 42.4 % (ref 39.0–52.0)
Hemoglobin: 14.3 g/dL (ref 13.0–17.0)
MCHC: 33.6 g/dL (ref 30.0–36.0)
MCV: 93.9 fl (ref 78.0–100.0)
Platelets: 211 10*3/uL (ref 150.0–400.0)
RBC: 4.52 Mil/uL (ref 4.22–5.81)
RDW: 12.9 % (ref 11.5–15.5)
WBC: 7 10*3/uL (ref 4.0–10.5)

## 2017-07-06 LAB — COMPREHENSIVE METABOLIC PANEL
ALT: 19 U/L (ref 0–53)
AST: 25 U/L (ref 0–37)
Albumin: 4.4 g/dL (ref 3.5–5.2)
Alkaline Phosphatase: 48 U/L (ref 39–117)
BILIRUBIN TOTAL: 1 mg/dL (ref 0.2–1.2)
BUN: 18 mg/dL (ref 6–23)
CO2: 26 meq/L (ref 19–32)
CREATININE: 0.75 mg/dL (ref 0.40–1.50)
Calcium: 9.4 mg/dL (ref 8.4–10.5)
Chloride: 103 mEq/L (ref 96–112)
GFR: 106.92 mL/min (ref >60.00)
GLUCOSE: 101 mg/dL — AB (ref 70–99)
Potassium: 3.7 mEq/L (ref 3.5–5.1)
SODIUM: 136 meq/L (ref 135–145)
Total Protein: 7.1 g/dL (ref 6.0–8.3)

## 2017-07-06 LAB — LIPID PANEL
CHOL/HDL RATIO: 4
Cholesterol: 174 mg/dL (ref 0–200)
HDL: 42.8 mg/dL (ref >39.00)
LDL Cholesterol: 117 mg/dL — ABNORMAL HIGH (ref 0–99)
NONHDL: 131.28
Triglycerides: 73 mg/dL (ref 0.0–149.0)
VLDL: 14.6 mg/dL (ref 0.0–40.0)

## 2017-07-06 LAB — TSH: TSH: 2.25 u[IU]/mL (ref 0.35–4.50)

## 2017-07-13 ENCOUNTER — Ambulatory Visit (INDEPENDENT_AMBULATORY_CARE_PROVIDER_SITE_OTHER): Payer: Medicare HMO | Admitting: Family Medicine

## 2017-07-13 ENCOUNTER — Ambulatory Visit (HOSPITAL_BASED_OUTPATIENT_CLINIC_OR_DEPARTMENT_OTHER)
Admission: RE | Admit: 2017-07-13 | Discharge: 2017-07-13 | Disposition: A | Discharge status: Home / Self Care | Payer: Medicare HMO | Source: Ambulatory Visit | Attending: Family Medicine | Admitting: Family Medicine

## 2017-07-13 ENCOUNTER — Encounter: Payer: Self-pay | Admitting: Family Medicine

## 2017-07-13 VITALS — BP 138/68 | HR 76 | Temp 98.3°F | Resp 18 | Ht 66.0 in | Wt 145.4 lb

## 2017-07-13 DIAGNOSIS — M7989 Other specified soft tissue disorders: Secondary | ICD-10-CM | POA: Diagnosis not present

## 2017-07-13 DIAGNOSIS — Z0001 Encounter for general adult medical examination with abnormal findings: Secondary | ICD-10-CM

## 2017-07-13 DIAGNOSIS — R634 Abnormal weight loss: Secondary | ICD-10-CM | POA: Diagnosis not present

## 2017-07-13 DIAGNOSIS — Z85828 Personal history of other malignant neoplasm of skin: Secondary | ICD-10-CM | POA: Diagnosis not present

## 2017-07-13 DIAGNOSIS — M79661 Pain in right lower leg: Secondary | ICD-10-CM

## 2017-07-13 DIAGNOSIS — G8929 Other chronic pain: Secondary | ICD-10-CM

## 2017-07-13 DIAGNOSIS — Z0000 Encounter for general adult medical examination without abnormal findings: Secondary | ICD-10-CM

## 2017-07-13 DIAGNOSIS — E785 Hyperlipidemia, unspecified: Secondary | ICD-10-CM

## 2017-07-13 DIAGNOSIS — M549 Dorsalgia, unspecified: Secondary | ICD-10-CM | POA: Diagnosis not present

## 2017-07-13 DIAGNOSIS — C44310 Basal cell carcinoma of skin of unspecified parts of face: Secondary | ICD-10-CM

## 2017-07-13 DIAGNOSIS — M79604 Pain in right leg: Secondary | ICD-10-CM | POA: Diagnosis not present

## 2017-07-13 DIAGNOSIS — N400 Enlarged prostate without lower urinary tract symptoms: Secondary | ICD-10-CM

## 2017-07-13 NOTE — Patient Instructions (Addendum)
Tylenol/Acetaminophen/APAP take a max of 3000 mg in 24 hours ES is 500 mg per tab  Advil/Motrin/Ibuprofen 200 mg tabs, 1-2 tabs every 6-8 hours as needed, max of 2400 mg in 24 hours (1200 mg is better)  Can use Aspirin instead of Ibuprofen but not both on same day. Can mix tylenol with either option. Lidocaine 4% patches by Aspercreme, Salon Pas and Icy Hot Lidocaine gel    Preventive Care 65 Years and Older, Male Preventive care refers to lifestyle choices and visits with your health care provider that can promote health and wellness. What does preventive care include?  A yearly physical exam. This is also called an annual well check.  Dental exams once or twice a year.  Routine eye exams. Ask your health care provider how often you should have your eyes checked.  Personal lifestyle choices, including: ? Daily care of your teeth and gums. ? Regular physical activity. ? Eating a healthy diet. ? Avoiding tobacco and drug use. ? Limiting alcohol use. ? Practicing safe sex. ? Taking low doses of aspirin every day. ? Taking vitamin and mineral supplements as recommended by your health care provider. What happens during an annual well check? The services and screenings done by your health care provider during your annual well check will depend on your age, overall health, lifestyle risk factors, and family history of disease. Counseling Your health care provider may ask you questions about your:  Alcohol use.  Tobacco use.  Drug use.  Emotional well-being.  Home and relationship well-being.  Sexual activity.  Eating habits.  History of falls.  Memory and ability to understand (cognition).  Work and work Statistician.  Screening You may have the following tests or measurements:  Height, weight, and BMI.  Blood pressure.  Lipid and cholesterol levels. These may be checked every 5 years, or more frequently if you are over 70 years old.  Skin check.  Lung cancer  screening. You may have this screening every year starting at age 78 if you have a 30-pack-year history of smoking and currently smoke or have quit within the past 15 years.  Fecal occult blood test (FOBT) of the stool. You may have this test every year starting at age 78.  Flexible sigmoidoscopy or colonoscopy. You may have a sigmoidoscopy every 5 years or a colonoscopy every 10 years starting at age 78.  Prostate cancer screening. Recommendations will vary depending on your family history and other risks.  Hepatitis C blood test.  Hepatitis B blood test.  Sexually transmitted disease (STD) testing.  Diabetes screening. This is done by checking your blood sugar (glucose) after you have not eaten for a while (fasting). You may have this done every 1-3 years.  Abdominal aortic aneurysm (AAA) screening. You may need this if you are a current or former smoker.  Osteoporosis. You may be screened starting at age 42 if you are at high risk.  Talk with your health care provider about your test results, treatment options, and if necessary, the need for more tests. Vaccines Your health care provider may recommend certain vaccines, such as:  Influenza vaccine. This is recommended every year.  Tetanus, diphtheria, and acellular pertussis (Tdap, Td) vaccine. You may need a Td booster every 10 years.  Varicella vaccine. You may need this if you have not been vaccinated.  Zoster vaccine. You may need this after age 78.  Measles, mumps, and rubella (MMR) vaccine. You may need at least one dose of MMR if you  were born in [birth year] or later. You may also need a second dose.  Pneumococcal 13-valent conjugate (PCV13) vaccine. One dose is recommended after age 78.  Pneumococcal polysaccharide (PPSV23) vaccine. One dose is recommended after age 78.  Meningococcal vaccine. You may need this if you have certain conditions.  Hepatitis A vaccine. You may need this if you have certain conditions or if you  travel or work in places where you may be exposed to hepatitis A.  Hepatitis B vaccine. You may need this if you have certain conditions or if you travel or work in places where you may be exposed to hepatitis B.  Haemophilus influenzae type b (Hib) vaccine. You may need this if you have certain risk factors.  Talk to your health care provider about which screenings and vaccines you need and how often you need them. This information is not intended to replace advice given to you by your health care provider. Make sure you discuss any questions you have with your health care provider. Document Released: 12/20/2015 Document Revised: 08/12/2016 Document Reviewed: 09/24/2015 Elsevier Interactive Patient Education  2017 [employer].

## 2017-07-13 NOTE — Assessment & Plan Note (Signed)
Encouraged heart healthy diet, increase exercise, avoid trans fats, consider a krill oil cap daily 

## 2017-07-13 NOTE — Assessment & Plan Note (Signed)
Patient encouraged to maintain heart healthy diet, regular exercise, adequate sleep. Consider daily probiotics. Take medications as prescribed 

## 2017-07-13 NOTE — Assessment & Plan Note (Signed)
Recent removal of BCC on left side of nose in Dec of 2017, he did well but did have some dermabrasion in January to clean it with good results.. Will see surgeon again as needed. Sees general dermatologist Dr Baltazar Najjar in [place] twice yearly. Has an appt in [place] and has a concern about a lesion in his right ear, a scab has developed.

## 2017-07-13 NOTE — Progress Notes (Signed)
Subjective:  I acted as a Education administrator for Dr. Charlett Blake. [place], [place]  Patient ID: Troy Mccann, male    DOB: 1941-01-16, 76 y.o.   MRN: [id number]  No chief complaint on file.   HPI  Patient is in today for an annual exam. Patient states he  Is having pain in the center lowerHas had a flare in low back pain over past couple of weeks. It flared after riding on a lawnmower for 2 hours. The next morning it was hurting more and since then it has worsened daily.  back at his hips and it radiates down his right leg down to his heel. More pain with movement. He notes a sense of a knot in his upper right calf. No trauma or warmth. He injured his back requiring surgical correction back in the 1960s and has low back pain with radicular symptoms off and on since then. Was previously seen mammography [facility] and put through some physical therapy with some decent results. He was seen by neurosurgery as well but did not want to proceed with imaging they recommended so stopped seeing them. He reports previously was able exercise but after this past week he is concerned because the pain is escalated to the point where he can no longer go to the gym and he is ready to proceed with further options. No incontinence or trauma. Denies CP/palp/SOB/HA/congestion/fevers/GI or GU c/o. Taking meds as prescribed. Complains of right sided pain radiating to right heel at times.   Patient Care Team: Mosie Lukes, MD as PCP - General (Family Medicine) Renette Butters, MD as Attending Physician (Orthopedic Surgery) Laroy Apple, MD as Referring Physician (Physical Medicine and Rehabilitation)   Past Medical History:  Diagnosis Date  . Abnormal PSA 01/12/2017  . BCC (basal cell carcinoma), face 01/12/2017  . BPH (benign prostatic hyperplasia) 04/15/2014  . Cataract 07/09/2015  . Hematuria 07/09/2015  . Medicare annual wellness visit, subsequent 10/21/2014  . Other and unspecified hyperlipidemia 04/15/2014  . SCC (squamous cell  carcinoma) 10/21/2014  . Tick bite 2007   Never assessed by a physician.  Marland Kitchen Unspecified constipation 03/08/2013    No past surgical history on file.  Family History  Problem Relation Age of Onset  . Arthritis Mother        rheumatoid  . Heart disease Mother        rheumatic fever  . Hypertension Mother   . Heart disease Father        MI  . Dementia Father   . Hypertension Sister   . Hypertension Son   . Obesity Son   . Aneurysm Maternal Grandmother   . Dementia Maternal Grandfather   . Heart disease Paternal Grandmother        MI  . Kidney disease Paternal Grandfather        bright's disease    Social History   Social History  . Marital status: Widowed    Spouse name: N/A  . Number of children: N/A  . Years of education: N/A   Occupational History  . Not on file.   Social History Main Topics  . Smoking status: Former Research scientist (life sciences)  . Smokeless tobacco: Never Used  . Alcohol use Yes     Comment: rare  . Drug use: No  . Sexual activity: Not on file   Other Topics Concern  . Not on file   Social History Narrative  . No narrative on file    Outpatient Medications Prior to Visit  Medication Sig Dispense Refill  . Ascorbic Acid (VITAMIN C) POWD Take 333 mg by mouth daily. Emergen-C-take 1/3 packet daily.    . B Complex-C (SUPER B COMPLEX PO) Take by mouth daily.    . Camphor-Menthol-Methyl Sal (TIGER BALM MUSCLE RUB EX) Apply topically at bedtime.    . Cholecalciferol (VITAMIN D-3) 1000 UNITS CAPS Take by mouth daily.     No facility-administered medications prior to visit.     Allergies  Allergen Reactions  . Yellow Jacket Venom [Bee Venom] Hives    Review of Systems  Constitutional: Negative for chills, fever and malaise/fatigue.  HENT: Negative for congestion and hearing loss.   Eyes: Negative for discharge.  Respiratory: Negative for cough, sputum production and shortness of breath.   Cardiovascular: Negative for chest pain, palpitations and leg  swelling.  Gastrointestinal: Negative for abdominal pain, blood in stool, constipation, diarrhea, heartburn, nausea and vomiting.  Genitourinary: Negative for dysuria, frequency, hematuria and urgency.  Musculoskeletal: Positive for back pain, joint pain and myalgias. Negative for falls.  Skin: Negative for rash.  Neurological: Negative for dizziness, sensory change, loss of consciousness, weakness and headaches.  Endo/Heme/Allergies: Negative for environmental allergies. Does not bruise/bleed easily.  Psychiatric/Behavioral: Negative for depression and suicidal ideas. The patient is not nervous/anxious and does not have insomnia.        Objective:    Physical Exam  Constitutional: He is oriented to person, place, and time. He appears well-developed and well-nourished. No distress.  HENT:  Head: Normocephalic and atraumatic.  Eyes: Conjunctivae are normal.  Neck: Neck supple. No thyromegaly present.  Cardiovascular: Normal rate, regular rhythm and normal heart sounds.   No murmur heard. Pulmonary/Chest: Effort normal and breath sounds normal. No respiratory distress. He has no wheezes.  Abdominal: Soft. Bowel sounds are normal. He exhibits no mass. There is no tenderness.  Musculoskeletal: He exhibits no edema.  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  Lymphadenopathy:    He has no cervical adenopathy.  Neurological: He is alert and oriented to person, place, and time.  Skin: Skin is warm and dry.  Psychiatric: He has a normal mood and affect. His behavior is normal.    BP 138/68 (BP Location: Left Arm, Patient Position: Sitting, Cuff Size: Normal)   Pulse 76   Temp 98.3 F (36.8 C) (Oral)   Resp 18   Ht 5\' 6"  (1.676 m)   Wt 145 lb 6.4 oz (66 kg)   SpO2 96%   BMI 23.47 kg/m  Wt Readings from Last 3 Encounters:  07/13/17 145 lb 6.4 oz (66 kg)  01/12/17 150 lb 3.2 oz (68.1 kg)  07/09/16  143 lb 12.8 oz (65.2 kg)   BP Readings from Last 3 Encounters:  07/13/17 138/68  01/12/17 130/76  07/09/16 138/74     Immunization History  Administered Date(s) Administered  . Pneumococcal Conjugate-13 10/15/2014  . Pneumococcal Polysaccharide-23 09/05/2012  . Tdap 09/05/2012    Health Maintenance  Topic Date Due  . INFLUENZA VACCINE  07/07/2017  . TETANUS/TDAP  09/05/2022  . PNA vac Low Risk Adult  Completed    Lab Results  Component Value Date   WBC 7.0 07/06/2017   HGB 14.3 07/06/2017   HCT 42.4 07/06/2017   PLT 211.0 07/06/2017   GLUCOSE 101 (H) 07/06/2017   CHOL 174 07/06/2017   TRIG 73.0 07/06/2017   HDL 42.80 07/06/2017   LDLCALC 117 (H) 07/06/2017  ALT 19 07/06/2017   AST 25 07/06/2017   NA 136 07/06/2017   K 3.7 07/06/2017   CL 103 07/06/2017   CREATININE 0.75 07/06/2017   BUN 18 07/06/2017   CO2 26 07/06/2017   TSH 2.25 07/06/2017   PSA 4.62 (H) 10/15/2014    Lab Results  Component Value Date   TSH 2.25 07/06/2017   Lab Results  Component Value Date   WBC 7.0 07/06/2017   HGB 14.3 07/06/2017   HCT 42.4 07/06/2017   MCV 93.9 07/06/2017   PLT 211.0 07/06/2017   Lab Results  Component Value Date   NA 136 07/06/2017   K 3.7 07/06/2017   CO2 26 07/06/2017   GLUCOSE 101 (H) 07/06/2017   BUN 18 07/06/2017   CREATININE 0.75 07/06/2017   BILITOT 1.0 07/06/2017   ALKPHOS 48 07/06/2017   AST 25 07/06/2017   ALT 19 07/06/2017   PROT 7.1 07/06/2017   ALBUMIN 4.4 07/06/2017   CALCIUM 9.4 07/06/2017   GFR 106.92 07/06/2017   Lab Results  Component Value Date   CHOL 174 07/06/2017   Lab Results  Component Value Date   HDL 42.80 07/06/2017   Lab Results  Component Value Date   LDLCALC 117 (H) 07/06/2017   Lab Results  Component Value Date   TRIG 73.0 07/06/2017   Lab Results  Component Value Date   CHOLHDL 4 07/06/2017   No results found for: HGBA1C       Assessment & Plan:   Problem List Items Addressed This Visit     Weight loss    Weight essentially stable over past year. He is eating well and getting hungry      Chronic back pain    Has had a flare in low back pain over past couple of weeks. It flared after riding on a lawnmower for 2 hours. The next morning it was hurting more and since then it has worsened daily.  Declines muscle relaxants and PT. Agrees to referral back to [facility] for further consideration and in the meantime can alternate tylenol, Ibuprofen and Lidocaine topically to manage his discomfort. Report any new or concerning symptoms       Relevant Orders   Ambulatory referral to Orthopedic Surgery   BPH (benign prostatic hyperplasia)    Follows with Dr Francina Ames at [facility] urology      Hyperlipidemia    Encouraged heart healthy diet, increase exercise, avoid trans fats, consider a krill oil cap daily      Preventative health care    Patient encouraged to maintain heart healthy diet, regular exercise, adequate sleep. Consider daily probiotics. Take medications as prescribed      BCC (basal cell carcinoma), face    Recent removal of BCC on left side of nose in Dec of 2017, he did well but did have some dermabrasion in January to clean it with good results.. Will see surgeon again as needed. Sees general dermatologist Dr Baltazar Najjar in [place] twice yearly. Has an appt in [place] and has a concern about a lesion in his right ear, a scab has developed.       Right calf pain - Primary    Feels a knot and pain in the right calf will proceed with ultrasound to rule out DVT, likely related to back injury and radiculopathy      Relevant Orders   US Venous Img Lower Unilateral Right      I am having Mr. Wolfinger maintain his Vitamin D-3,  Camphor-Menthol-Methyl Sal (TIGER BALM MUSCLE RUB EX), Vitamin C, and B Complex-C (SUPER B COMPLEX PO).  No orders of the defined types were placed in this encounter.   CMA served as Education administrator during this visit. History, Physical and Plan performed by  medical provider. Documentation and orders reviewed and attested to.  Penni Homans, MD

## 2017-07-13 NOTE — Assessment & Plan Note (Signed)
Patient with normal but high normal numbers today secondary to pain, no changes today

## 2017-07-13 NOTE — Assessment & Plan Note (Addendum)
Has had a flare in low back pain over past couple of weeks. It flared after riding on a lawnmower for 2 hours. The next morning it was hurting more and since then it has worsened daily.  Declines muscle relaxants and PT. Agrees to referral back to [facility] for further consideration and in the meantime can alternate tylenol, Ibuprofen and Lidocaine topically to manage his discomfort. Report any new or concerning symptoms

## 2017-07-13 NOTE — Assessment & Plan Note (Signed)
Weight essentially stable over past year. He is eating well and getting hungry

## 2017-07-13 NOTE — Assessment & Plan Note (Signed)
Follows with Dr Francina Ames at [facility] urology

## 2017-07-13 NOTE — Assessment & Plan Note (Signed)
Feels a knot and pain in the right calf will proceed with ultrasound to rule out DVT, likely related to back injury and radiculopathy

## 2017-07-16 DIAGNOSIS — M4120 Other idiopathic scoliosis, site unspecified: Secondary | ICD-10-CM | POA: Diagnosis not present

## 2017-07-16 DIAGNOSIS — M48061 Spinal stenosis, lumbar region without neurogenic claudication: Secondary | ICD-10-CM | POA: Diagnosis not present

## 2017-07-20 ENCOUNTER — Other Ambulatory Visit: Payer: Self-pay | Admitting: Orthopedic Surgery

## 2017-07-20 DIAGNOSIS — G8929 Other chronic pain: Secondary | ICD-10-CM

## 2017-07-20 DIAGNOSIS — M545 Low back pain: Principal | ICD-10-CM

## 2017-07-26 ENCOUNTER — Ambulatory Visit
Admission: RE | Admit: 2017-07-26 | Discharge: 2017-07-26 | Disposition: A | Discharge status: Home / Self Care | Payer: Medicare HMO | Source: Ambulatory Visit | Attending: Orthopedic Surgery | Admitting: Orthopedic Surgery

## 2017-07-26 DIAGNOSIS — M4314 Spondylolisthesis, thoracic region: Secondary | ICD-10-CM | POA: Diagnosis not present

## 2017-07-26 DIAGNOSIS — G8929 Other chronic pain: Secondary | ICD-10-CM

## 2017-07-26 DIAGNOSIS — M48061 Spinal stenosis, lumbar region without neurogenic claudication: Secondary | ICD-10-CM | POA: Diagnosis not present

## 2017-07-26 DIAGNOSIS — M545 Low back pain: Principal | ICD-10-CM

## 2017-07-26 DIAGNOSIS — M5124 Other intervertebral disc displacement, thoracic region: Secondary | ICD-10-CM | POA: Diagnosis not present

## 2017-07-26 MED ORDER — IOPAMIDOL (ISOVUE-M 300) INJECTION 61%
10.0000 mL | Freq: Once | INTRAMUSCULAR | Status: AC | PRN
  Administered 2017-07-26: 10 mL via INTRATHECAL

## 2017-07-26 MED ORDER — DIAZEPAM 5 MG PO TABS
5.0000 mg | ORAL_TABLET | Freq: Once | ORAL | Status: AC
  Administered 2017-07-26: 5 mg via ORAL

## 2017-07-26 NOTE — Discharge Instructions (Signed)
Myelogram Discharge Instructions  1. Go home and rest quietly for the next 24 hours.  It is important to lie flat for the next 24 hours.  Get up only to go to the restroom.  You may lie in the bed or on a couch on your back, your stomach, your left side or your right side.  You may have one pillow under your head.  You may have pillows between your knees while you are on your side or under your knees while you are on your back.  2. DO NOT drive today.  Recline the seat as far back as it will go, while still wearing your seat belt, on the way home.  3. You may get up to go to the bathroom as needed.  You may sit up for 10 minutes to eat.  You may resume your normal diet and medications unless otherwise indicated.  Drink lots of extra fluids today and tomorrow.  4. The incidence of headache, nausea, or vomiting is about 5% (one in 20 patients).  If you develop a headache, lie flat and drink plenty of fluids until the headache goes away.  Caffeinated beverages may be helpful.  If you develop severe nausea and vomiting or a headache that does not go away with flat bed rest, call [phone].  5. You may resume normal activities after your 24 hours of bed rest is over; however, do not exert yourself strongly or do any heavy lifting tomorrow. If when you get up you have a headache when standing, go back to bed and force fluids for another 24 hours.  6. Call your physician for a follow-up appointment.  The results of your myelogram will be sent directly to your physician by the following day.  7. If you have any questions or if complications develop after you arrive home, please call [phone].  Discharge instructions have been explained to the patient.  The patient, or the person responsible for the patient, fully understands these instructions.       May resume Tramadol on Aug. 21, 2018, after 9:30 am.

## 2017-07-26 NOTE — Progress Notes (Signed)
States he has been off Tramadol since last week.

## 2017-08-05 DIAGNOSIS — M48061 Spinal stenosis, lumbar region without neurogenic claudication: Secondary | ICD-10-CM | POA: Diagnosis not present

## 2017-08-05 DIAGNOSIS — M545 Low back pain: Secondary | ICD-10-CM | POA: Diagnosis not present

## 2017-08-05 DIAGNOSIS — M5416 Radiculopathy, lumbar region: Secondary | ICD-10-CM | POA: Diagnosis not present

## 2017-08-05 DIAGNOSIS — M4120 Other idiopathic scoliosis, site unspecified: Secondary | ICD-10-CM | POA: Diagnosis not present

## 2017-08-12 ENCOUNTER — Ambulatory Visit: Payer: Medicare HMO | Attending: Physical Medicine and Rehabilitation | Admitting: Physical Therapy

## 2017-08-12 DIAGNOSIS — M6281 Muscle weakness (generalized): Secondary | ICD-10-CM

## 2017-08-12 DIAGNOSIS — R262 Difficulty in walking, not elsewhere classified: Secondary | ICD-10-CM | POA: Diagnosis present

## 2017-08-12 DIAGNOSIS — G8929 Other chronic pain: Secondary | ICD-10-CM | POA: Diagnosis present

## 2017-08-12 DIAGNOSIS — M5441 Lumbago with sciatica, right side: Secondary | ICD-10-CM | POA: Diagnosis present

## 2017-08-12 DIAGNOSIS — M62830 Muscle spasm of back: Secondary | ICD-10-CM | POA: Diagnosis present
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
## 2017-08-17 ENCOUNTER — Ambulatory Visit: Payer: Medicare HMO

## 2017-08-17 DIAGNOSIS — R262 Difficulty in walking, not elsewhere classified: Secondary | ICD-10-CM

## 2017-08-17 DIAGNOSIS — M5441 Lumbago with sciatica, right side: Secondary | ICD-10-CM | POA: Diagnosis not present

## 2017-08-17 DIAGNOSIS — M62830 Muscle spasm of back: Secondary | ICD-10-CM

## 2017-08-17 DIAGNOSIS — M6281 Muscle weakness (generalized): Secondary | ICD-10-CM

## 2017-08-17 DIAGNOSIS — G8929 Other chronic pain: Secondary | ICD-10-CM

## 2017-08-17 NOTE — Therapy (Signed)
[facility] [address]  [place], [place], [address] Phone: [phone]   Fax:  [phone]  Physical Therapy Treatment  Patient Details  Name: Troy Mccann MRN: [id number] Date of Birth: [birth year] Referring Provider: Laroy Apple, MD  Encounter Date: 08/17/2017      PT End of Session - 08/17/17 0949    Visit Number 2   Number of Visits 16   Date for PT Re-Evaluation 10/08/17   Authorization Type Aetna Medicare   PT Start Time 0932   PT Stop Time 1012   PT Time Calculation (min) 40 min   Activity Tolerance Patient tolerated treatment well   Behavior During Therapy [facility] for tasks assessed/performed      Past Medical History:  Diagnosis Date  . Abnormal PSA 01/12/2017  . BCC (basal cell carcinoma), face 01/12/2017  . BPH (benign prostatic hyperplasia) 04/15/2014  . Cataract 07/09/2015  . Hematuria 07/09/2015  . Medicare annual wellness visit, subsequent 10/21/2014  . Other and unspecified hyperlipidemia 04/15/2014  . SCC (squamous cell carcinoma) 10/21/2014  . Tick bite 2007   Never assessed by a physician.  Marland Kitchen Unspecified constipation 03/08/2013    No past surgical history on file.  There were no vitals filed for this visit.      Subjective Assessment - 08/17/17 0937    Subjective Pt. doing well today noting "haven't been having much pain today".  Reports performing HEP daily.     Patient Stated Goals "to be able to feel better so I can get back to yard work and working out"   Currently in Pain? No/denies   Pain Score 0-No pain   Pain Radiating Towards R LE radicular numbness into foot    Multiple Pain Sites No                         [facility] Adult PT Treatment/Exercise - 08/17/17 0958      Self-Care   Self-Care Other Self-Care Comments   Other Self-Care Comments  Discussion with pt. regarding appropriateness of using various machine at [facility]; pt. encouraged to use NuStep machine 10-15  min, 3x wk      Lumbar Exercises: Stretches   Passive Hamstring Stretch 2 reps;30 seconds   Passive Hamstring Stretch Limitations B; with strap   Hip Flexor Stretch 1 rep;60 seconds   Hip Flexor Stretch Limitations B; mod thomas position   ITB Stretch 2 reps;30 seconds   ITB Stretch Limitations B; with strap   Piriformis Stretch 2 reps;30 seconds   Piriformis Stretch Limitations modified and figure-4 with strap     Lumbar Exercises: Supine   Clam 10 reps;3 seconds   Clam Limitations ab brace + alternating hip abd with red TB at knees    Bridge 10 reps;3 seconds   Other Supine Lumbar Exercises Hooklying march with red looped TB at knees x 10 reps     Knee/Hip Exercises: Aerobic   Nustep Lvl 6, 6 min                   PT Short Term Goals - 08/17/17 0949      PT SHORT TERM GOAL #1   Title Pt will have a basic understanding of flexibility, core stabilization and strengthening HEP   Status On-going           PT Long Term Goals - 08/17/17 0949      PT LONG TERM GOAL #1  Title Independent with ongoing HEP +/- gym program   Status On-going     PT LONG TERM GOAL #2   Title Pt will report a 50% improvement in pain and function with daily activities.     Status On-going     PT LONG TERM GOAL #3   Title B hip strength >/= 4/5 for improved stability to allow for increased standing and walking tolerance   Status On-going     PT LONG TERM GOAL #4   Title Pt will report ability to resume normal level of activity within home and at the gym w/o limitation due to increased LBP or radicular symptoms   Status On-going               Plan - 08/17/17 0950    Clinical Impression Statement Troy Mccann doing well noting "I haven't been having much pain today".  Notes he has been performing HEP daily and able to demo good overall technique in treatment however some cueing required to "relax leg muscles" with LE stretching.  Basic lumbopelvic strengthening activities introduced  today and tolerated well.  Troy Mccann leaving treatment pain free.  Will plan to progress per pt. tolerance in coming visits.     PT Treatment/Interventions Patient/family education;Therapeutic exercise;Neuromuscular re-education;Therapeutic activities;Functional mobility training;Manual techniques;Dry needling;Taping;Electrical Stimulation;Moist Heat;ADLs/Self Care Home Management      Patient will benefit from skilled therapeutic intervention in order to improve the following deficits and impairments:  Pain, Increased muscle spasms, Impaired sensation, Decreased strength, Postural dysfunction, Improper body mechanics, Impaired flexibility, Impaired tone, Decreased range of motion, Decreased activity tolerance, Difficulty walking, Abnormal gait  Visit Diagnosis: Chronic right-sided low back pain with right-sided sciatica  Muscle weakness (generalized)  Muscle spasm of back  Difficulty in walking, not elsewhere classified     Problem List Patient Active Problem List   Diagnosis Date Noted  . Right calf pain 07/13/2017  . BCC (basal cell carcinoma), face 01/12/2017  . Abnormal PSA 01/12/2017  . Hematuria 07/09/2015  . Cataract 07/09/2015  . Preventative health care 10/21/2014  . SCC (squamous cell carcinoma) 10/21/2014  . BPH (benign prostatic hyperplasia) 04/15/2014  . Hyperlipidemia 04/15/2014  . Constipation 03/08/2013  . Elevated blood pressure 11/08/2012  . Arthralgia 09/05/2012  . Weight loss 09/05/2012  . Chronic back pain 09/05/2012    Bess Harvest, PTA 08/17/17 1:32 PM  [facility] [address]  [place], [place], [address] Phone: [phone]   Fax:  [phone]  Name: Troy Mccann MRN: [id number] Date of Birth: 1941-05-03

## 2017-08-20 ENCOUNTER — Ambulatory Visit: Payer: Medicare HMO | Admitting: Physical Therapy

## 2017-08-20 DIAGNOSIS — M5441 Lumbago with sciatica, right side: Principal | ICD-10-CM

## 2017-08-20 DIAGNOSIS — R262 Difficulty in walking, not elsewhere classified: Secondary | ICD-10-CM

## 2017-08-20 DIAGNOSIS — G8929 Other chronic pain: Secondary | ICD-10-CM

## 2017-08-20 DIAGNOSIS — M62830 Muscle spasm of back: Secondary | ICD-10-CM

## 2017-08-20 DIAGNOSIS — M6281 Muscle weakness (generalized): Secondary | ICD-10-CM
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
## 2017-08-24 ENCOUNTER — Ambulatory Visit: Payer: Medicare HMO

## 2017-08-24 DIAGNOSIS — M5441 Lumbago with sciatica, right side: Secondary | ICD-10-CM | POA: Diagnosis not present

## 2017-08-24 DIAGNOSIS — G8929 Other chronic pain: Secondary | ICD-10-CM

## 2017-08-24 DIAGNOSIS — M62830 Muscle spasm of back: Secondary | ICD-10-CM

## 2017-08-24 DIAGNOSIS — R262 Difficulty in walking, not elsewhere classified: Secondary | ICD-10-CM

## 2017-08-24 DIAGNOSIS — M6281 Muscle weakness (generalized): Secondary | ICD-10-CM

## 2017-08-24 NOTE — Therapy (Signed)
[facility] [address]  [place], [place], [address] Phone: [phone]   Fax:  [phone]  Physical Therapy Treatment  Patient Details  Name: Troy Mccann MRN: [id number] Date of Birth: 1941/06/23 Referring Provider: Laroy Apple, MD  Encounter Date: 08/24/2017      PT End of Session - 08/24/17 0942    Visit Number 4   Number of Visits 16   Date for PT Re-Evaluation 10/08/17   Authorization Type A[employer]na Medicare   PT Start Time [phone]   PT Stop Time 1018   PT Time Calculation (min) 47 min   Activity Tolerance Patient tolerated treatment well   Behavior During Therapy [facility] for tasks assessed/performed      Past Medical History:  Diagnosis Date  . Abnormal PSA 01/12/2017  . BCC (basal cell carcinoma), face 01/12/2017  . BPH (benign prostatic hyperplasia) 04/15/2014  . Cataract 07/09/2015  . Hematuria 07/09/2015  . Medicare annual wellness visit, subsequent 10/21/2014  . Other and unspecified hyperlipidemia 04/15/2014  . SCC (squamous cell carcinoma) 10/21/2014  . Tick bite 2007   Never assessed by a physician.  Marland Kitchen Unspecified constipation 03/08/2013    No past surgical history on file.  There were no vitals filed for this visit.      Subjective Assessment - 08/24/17 0937    Subjective Pt. doing well today.  Notes I'm hurting today about like last time.     Patient Stated Goals "to be able to feel better so I can get back to yard work and working out"   Currently in Pain? Yes   Pain Score 3    Pain Location Leg   Pain Orientation Right   Pain Descriptors / Indicators Tingling;Numbness;Stabbing   Pain Type Acute pain   Pain Radiating Towards R LE radicular numbness into foot    Pain Frequency Intermittent   Aggravating Factors  standing    Pain Relieving Factors gabapendtin,    Multiple Pain Sites No                         [facility] Adult PT Treatment/Exercise - 08/24/17 0946      Self-Care   Self-Care Other Self-Care Comments   Other Self-Care Comments  Discussion of updated HEP to check for tolerance; pt. reports "feet slipping on bed" with bridge activity; thinks he will be able to perform this activity on floor and plans to try this activity on floor in future      Lumbar Exercises: Stretches   Passive Hamstring Stretch 2 reps;30 seconds   Passive Hamstring Stretch Limitations B; with strap     Lumbar Exercises: Sidelying   Clam 15 reps;3 seconds   Clam Limitations Bil with red TB     Knee/Hip Exercises: Aerobic   Nustep lvl 6 x 6'     Knee/Hip Exercises: Standing   Hip Flexion Right;Left;10 reps;Knee bent   Hip Flexion Limitations holding onto TM   Hip Abduction Right;Left;10 reps;Knee straight   Abduction Limitations holding onto TM   Hip Extension Right;Left;10 reps;Knee straight   Extension Limitations holding onto TM      Knee/Hip Exercises: Seated   Long Arc Quad Right;Left;10 reps;Weights   Long Arc Quad Weight 2 lbs.   Long [employer] Limitations with adduction ball squeeze   Hamstring Curl Right;Left;10 reps   Hamstring Limitations with red TB   Sit to Sand 10 reps;with UE support;1 set  1  UE pushoff                   PT Short Term Goals - 08/17/17 0949      PT SHORT TERM GOAL #1   Title Pt will have a basic understanding of flexibility, core stabilization and strengthening HEP   Status On-going           PT Long Term Goals - 08/17/17 0949      PT LONG TERM GOAL #1   Title Independent with ongoing HEP +/- gym program   Status On-going     PT LONG TERM GOAL #2   Title Pt will report a 50% improvement in pain and function with daily activities.     Status On-going     PT LONG TERM GOAL #3   Title B hip strength >/= 4/5 for improved stability to allow for increased standing and walking tolerance   Status On-going     PT LONG TERM GOAL #4   Title Pt will report ability to resume normal level of activity within home  and at the gym w/o limitation due to increased LBP or radicular symptoms   Status On-going               Plan - 08/24/17 2725    Clinical Impression Statement Pt. doing well today with no new complaints.  Still with radicular symptoms down R LE today.  Tolerated addition of standing 3-way SLR well today without report of increased pain.  Some difficulty controlling pacing with sit<>stand from mat table today however improved with cueing.  Reports only issue with updated HEP was "feet slipping on bed" while performing bridge.  Pt. with plans to attempt this activity on floor in coming days and feels he will not have trouble rising  from floor this this.  Seems to be progressing well.  Will monitor response to initiation of standing therex in coming visits.   PT Treatment/Interventions Patient/family education;Therapeutic exercise;Neuromuscular re-education;Therapeutic activities;Functional mobility training;Manual techniques;Dry needling;Taping;Electrical Stimulation;Moist Heat;ADLs/Self Care Home Management      Patient will benefit from skilled therapeutic intervention in order to improve the following deficits and impairments:  Pain, Increased muscle spasms, Impaired sensation, Decreased strength, Postural dysfunction, Improper body mechanics, Impaired flexibility, Impaired tone, Decreased range of motion, Decreased activity tolerance, Difficulty walking, Abnormal gait  Visit Diagnosis: Chronic right-sided low back pain with right-sided sciatica  Muscle weakness (generalized)  Muscle spasm of back  Difficulty in walking, not elsewhere classified     Problem List Patient Active Problem List   Diagnosis Date Noted  . Right calf pain 07/13/2017  . BCC (basal cell carcinoma), face 01/12/2017  . Abnormal PSA 01/12/2017  . Hematuria 07/09/2015  . Cataract 07/09/2015  . Preventative health care 10/21/2014  . SCC (squamous cell carcinoma) 10/21/2014  . BPH (benign prostatic  hyperplasia) 04/15/2014  . Hyperlipidemia 04/15/2014  . Constipation 03/08/2013  . Elevated blood pressure 11/08/2012  . Arthralgia 09/05/2012  . Weight loss 09/05/2012  . Chronic back pain 09/05/2012    Bess Harvest, PTA 08/24/17 12:09 PM  [facility] [address]  [place], [place], [address] Phone: [phone]   Fax:  [phone]  Name: Troy Mccann MRN: [id number] Date of Birth: [birth year]

## 2017-08-26 ENCOUNTER — Ambulatory Visit: Payer: Medicare HMO

## 2017-08-26 DIAGNOSIS — M6281 Muscle weakness (generalized): Secondary | ICD-10-CM

## 2017-08-26 DIAGNOSIS — M62830 Muscle spasm of back: Secondary | ICD-10-CM

## 2017-08-26 DIAGNOSIS — G8929 Other chronic pain: Secondary | ICD-10-CM

## 2017-08-26 DIAGNOSIS — M5441 Lumbago with sciatica, right side: Principal | ICD-10-CM

## 2017-08-26 DIAGNOSIS — R262 Difficulty in walking, not elsewhere classified: Secondary | ICD-10-CM

## 2017-08-26 NOTE — Therapy (Signed)
[facility] [address]  [place], [place], [address] Phone: [phone]   Fax:  [phone]  Physical Therapy Treatment  Patient Details  Name: Troy Mccann MRN: [id number] Date of Birth: [birth year] Referring Provider: Laroy Apple, MD  Encounter Date: 08/26/2017      PT End of Session - 08/26/17 1410    Visit Number 5   Number of Visits 16   Date for PT Re-Evaluation 10/08/17   Authorization Type [employer]na Medicare   PT Start Time 1401   PT Stop Time 1445   PT Time Calculation (min) 44 min   Activity Tolerance Patient tolerated treatment well   Behavior During Therapy [facility] for tasks assessed/performed      Past Medical History:  Diagnosis Date  . Abnormal PSA 01/12/2017  . BCC (basal cell carcinoma), face 01/12/2017  . BPH (benign prostatic hyperplasia) 04/15/2014  . Cataract 07/09/2015  . Hematuria 07/09/2015  . Medicare annual wellness visit, subsequent 10/21/2014  . Other and unspecified hyperlipidemia 04/15/2014  . SCC (squamous cell carcinoma) 10/21/2014  . Tick bite 2007   Never assessed by a physician.  Marland Kitchen Unspecified constipation 03/08/2013    No past surgical history on file.  There were no vitals filed for this visit.      Subjective Assessment - 08/26/17 1406    Subjective Pt. noting "my sciatica has improved"over last few days.     Patient Stated Goals "to be able to feel better so I can get back to yard work and working out"   Currently in Pain? Yes   Pain Score 2    Pain Location Leg   Pain Orientation Right   Pain Descriptors / Indicators Tingling;Numbness;Stabbing   Pain Type Acute pain   Pain Radiating Towards Numbness into R posterior LE into foot    Pain Onset More than a month ago   Pain Frequency Intermittent   Aggravating Factors  standing,    Multiple Pain Sites No                         [facility] Adult PT Treatment/Exercise - 08/26/17 1413      Knee/Hip  Exercises: Aerobic   Recumbent Bike 1 min recumbent bike (lvl 1) due to pt. request   Nustep lvl 6 x 6'     Knee/Hip Exercises: Standing   Hip Flexion Right;Left;10 reps;Knee bent   Hip Flexion Limitations holding onto counter; 1#    Hip Abduction Right;Left;10 reps;Knee straight   Abduction Limitations holding onto counter; 1#    Hip Extension Right;Left;10 reps;Knee straight   Extension Limitations holding onto counter; 1#     Knee/Hip Exercises: Seated   Long Arc Quad Right;Left;10 reps;Weights  2 sets   Long Arc Quad Weight 2 lbs.   Long [employer] Limitations with adduction ball squeeze   Other Seated Knee/Hip Exercises B fitter leg press (1 blue, 1 black) x 15 reps each side    Hamstring Curl Right;Left;10 reps   Hamstring Limitations with red TB                  PT Short Term Goals - 08/17/17 0949      PT SHORT TERM GOAL #1   Title Pt will have a basic understanding of flexibility, core stabilization and strengthening HEP   Status On-going           PT Long Term Goals - 08/17/17 5573  PT LONG TERM GOAL #1   Title Independent with ongoing HEP +/- gym program   Status On-going     PT LONG TERM GOAL #2   Title Pt will report a 50% improvement in pain and function with daily activities.     Status On-going     PT LONG TERM GOAL #3   Title B hip strength >/= 4/5 for improved stability to allow for increased standing and walking tolerance   Status On-going     PT LONG TERM GOAL #4   Title Pt will report ability to resume normal level of activity within home and at the gym w/o limitation due to increased LBP or radicular symptoms   Status On-going               Plan - 08/26/17 1437    Clinical Impression Statement Pt. noting he was not sore after last visit. and reports, "my sciatica is feeling better today".  Progressed to more standing activities today without issue.  Some cueing required for full scapular retraction with postural training  today.  Seems to be tolerated all therex well at this point.     PT Treatment/Interventions Patient/family education;Therapeutic exercise;Neuromuscular re-education;Therapeutic activities;Functional mobility training;Manual techniques;Dry needling;Taping;Electrical Stimulation;Moist Heat;ADLs/Self Care Home Management      Patient will benefit from skilled therapeutic intervention in order to improve the following deficits and impairments:  Pain, Increased muscle spasms, Impaired sensation, Decreased strength, Postural dysfunction, Improper body mechanics, Impaired flexibility, Impaired tone, Decreased range of motion, Decreased activity tolerance, Difficulty walking, Abnormal gait  Visit Diagnosis: Chronic right-sided low back pain with right-sided sciatica  Muscle weakness (generalized)  Muscle spasm of back  Difficulty in walking, not elsewhere classified     Problem List Patient Active Problem List   Diagnosis Date Noted  . Right calf pain 07/13/2017  . BCC (basal cell carcinoma), face 01/12/2017  . Abnormal PSA 01/12/2017  . Hematuria 07/09/2015  . Cataract 07/09/2015  . Preventative health care 10/21/2014  . SCC (squamous cell carcinoma) 10/21/2014  . BPH (benign prostatic hyperplasia) 04/15/2014  . Hyperlipidemia 04/15/2014  . Constipation 03/08/2013  . Elevated blood pressure 11/08/2012  . Arthralgia 09/05/2012  . Weight loss 09/05/2012  . Chronic back pain 09/05/2012    Bess Harvest, PTA 08/26/17 6:40 PM  [facility] [address]  [place], [place], [address] Phone: [phone]   Fax:  [phone]  Name: Troy Mccann MRN: [id number] Date of Birth: 02-12-1941

## 2017-08-27 DIAGNOSIS — Z85828 Personal history of other malignant neoplasm of skin: Secondary | ICD-10-CM | POA: Diagnosis not present

## 2017-08-27 DIAGNOSIS — D1801 Hemangioma of skin and subcutaneous tissue: Secondary | ICD-10-CM | POA: Diagnosis not present

## 2017-08-27 DIAGNOSIS — D0421 Carcinoma in situ of skin of right ear and external auricular canal: Secondary | ICD-10-CM | POA: Diagnosis not present

## 2017-08-27 DIAGNOSIS — Z08 Encounter for follow-up examination after completed treatment for malignant neoplasm: Secondary | ICD-10-CM | POA: Diagnosis not present

## 2017-08-27 DIAGNOSIS — D485 Neoplasm of uncertain behavior of skin: Secondary | ICD-10-CM | POA: Diagnosis not present

## 2017-08-27 DIAGNOSIS — L821 Other seborrheic keratosis: Secondary | ICD-10-CM | POA: Diagnosis not present

## 2017-08-30 ENCOUNTER — Ambulatory Visit: Payer: Medicare HMO | Admitting: Physical Therapy

## 2017-08-30 DIAGNOSIS — M5441 Lumbago with sciatica, right side: Principal | ICD-10-CM

## 2017-08-30 DIAGNOSIS — R262 Difficulty in walking, not elsewhere classified: Secondary | ICD-10-CM

## 2017-08-30 DIAGNOSIS — M62830 Muscle spasm of back: Secondary | ICD-10-CM

## 2017-08-30 DIAGNOSIS — G8929 Other chronic pain: Secondary | ICD-10-CM

## 2017-08-30 DIAGNOSIS — M6281 Muscle weakness (generalized): Secondary | ICD-10-CM

## 2017-08-30 NOTE — Therapy (Signed)
[facility] [address]  [place], [place], [address] Phone: [phone]   Fax:  [phone]  Physical Therapy Treatment  Patient Details  Name: Troy Mccann MRN: [id number] Date of Birth: [birth year] Referring Provider: Laroy Apple, MD  Encounter Date: 08/30/2017      PT End of Session - 08/30/17 1449    Visit Number 6   Number of Visits 16   Date for PT Re-Evaluation 10/08/17   Authorization Type Aetna Medicare   PT Start Time 1449   PT Stop Time 1536   PT Time Calculation (min) 47 min   Activity Tolerance Patient tolerated treatment well   Behavior During Therapy [facility] for tasks assessed/performed      Past Medical History:  Diagnosis Date  . Abnormal PSA 01/12/2017  . BCC (basal cell carcinoma), face 01/12/2017  . BPH (benign prostatic hyperplasia) 04/15/2014  . Cataract 07/09/2015  . Hematuria 07/09/2015  . Medicare annual wellness visit, subsequent 10/21/2014  . Other and unspecified hyperlipidemia 04/15/2014  . SCC (squamous cell carcinoma) 10/21/2014  . Tick bite 2007   Never assessed by a physician.  Marland Kitchen Unspecified constipation 03/08/2013    No past surgical history on file.  There were no vitals filed for this visit.      Subjective Assessment - 08/30/17 1452    Subjective Pt reporting "a few aches and pains but nothing alarming". "It feels like it come and goes".   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 Yes   Pain Score 2    Pain Location Leg   Pain Orientation Right   Pain Descriptors / Indicators Numbness;Tingling   Pain Type Acute pain   Pain Radiating Towards numbness & tingling down posterior R LE to foot - worst from heel to toes                         [facility] Adult PT Treatment/Exercise - 08/30/17 1449       Exercises   Exercises Knee/Hip;Lumbar     Lumbar Exercises: Standing   Row Both;15 reps;Theraband;Strengthening   Theraband Level (Row) Level 2 (Red)   Row Limitations abd brace + staggered stance   Shoulder Extension Both;15 reps;Theraband   Theraband Level (Shoulder Extension) Level 2 (Red)   Shoulder Extension Limitations abd brace + staggered stance     Knee/Hip Exercises: Aerobic   Recumbent Bike lvl 2 x 6'     Knee/Hip Exercises: Standing   Heel Raises Both;10 reps;3 seconds;Right;Left;5 reps   Heel Raises Limitations B con/ecc x10; B con/alt ecc x10   Hip Flexion Right;Left;10 reps;Knee straight;Stengthening   Hip Flexion Limitations looped red TB at ankles; UE support on counter   Hip Abduction Right;Left;10 reps;Knee straight;Stengthening   Abduction Limitations looped red TB at ankles; UE support on counter   Hip Extension Right;Left;10 reps;Knee straight;Stengthening   Extension Limitations looped red TB at ankles; UE support on counter   Functional Squat 15 reps;5 seconds   Functional Squat Limitations counter squat                PT Education - 08/30/17 1531    Education provided Yes   Education Details HEP addition - standing exercises   Person(s) Educated Patient  Methods Explanation;Demonstration   Comprehension Verbalized understanding;Returned demonstration;Need further instruction          PT Short Term Goals - 08/17/17 0949      PT SHORT TERM GOAL #1   Title Pt will have a basic understanding of flexibility, core stabilization and strengthening HEP   Status On-going           PT Long Term Goals - 08/17/17 0949      PT LONG TERM GOAL #1   Title Independent with ongoing HEP +/- gym program   Status On-going     PT LONG TERM GOAL #2   Title Pt will report a 50% improvement in pain and function with daily activities.     Status On-going     PT LONG TERM GOAL #3   Title B hip strength >/= 4/5 for improved stability to allow  for increased standing and walking tolerance   Status On-going     PT LONG TERM GOAL #4   Title Pt will report ability to resume normal level of activity within home and at the gym w/o limitation due to increased LBP or radicular symptoms   Status On-going               Plan - 08/30/17 1543    Clinical Impression Statement Pt reporting pain is becoming more intermittent but still having the radicular numbness and tingling into the R foot. Continued progression of standing exercises well tolerated with cues to clarify acore activation and pacing. HEP updated accordingly.   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; chronic back pain    PT Treatment/Interventions Patient/family education;Therapeutic exercise;Neuromuscular re-education;Therapeutic activities;Functional mobility training;Manual techniques;Dry needling;Taping;Electrical Stimulation;Moist Heat;ADLs/Self Care Home Management   Consulted and Agree with Plan of Care Patient      Patient will benefit from skilled therapeutic intervention in order to improve the following deficits and impairments:  Pain, Increased muscle spasms, Impaired sensation, Decreased strength, Postural dysfunction, Improper body mechanics, Impaired flexibility, Impaired tone, Decreased range of motion, Decreased activity tolerance, Difficulty walking, Abnormal gait  Visit Diagnosis: Chronic right-sided low back pain with right-sided sciatica  Muscle weakness (generalized)  Muscle spasm of back  Difficulty in walking, not elsewhere classified     Problem List Patient Active Problem List   Diagnosis Date Noted  . Right calf pain 07/13/2017  . BCC (basal cell carcinoma), face 01/12/2017  . Abnormal PSA 01/12/2017  . Hematuria 07/09/2015  . Cataract 07/09/2015  . Preventative health care 10/21/2014  . SCC (squamous cell carcinoma) 10/21/2014   . BPH (benign prostatic hyperplasia) 04/15/2014  . Hyperlipidemia 04/15/2014  . Constipation 03/08/2013  . Elevated blood pressure 11/08/2012  . Arthralgia 09/05/2012  . Weight loss 09/05/2012  . Chronic back pain 09/05/2012    Percival Spanish, PT, MPT 08/30/2017, 4:08 PM  [facility] [address]  [place], [place], [address] Phone: [phone]   Fax:  [phone]  Name: Troy Mccann MRN: [id number] Date of Birth: 08-18-1941

## 2017-09-01 ENCOUNTER — Ambulatory Visit: Payer: Medicare HMO | Admitting: Physical Therapy

## 2017-09-01 DIAGNOSIS — G8929 Other chronic pain: Secondary | ICD-10-CM

## 2017-09-01 DIAGNOSIS — M5441 Lumbago with sciatica, right side: Principal | ICD-10-CM

## 2017-09-01 DIAGNOSIS — M6281 Muscle weakness (generalized): Secondary | ICD-10-CM

## 2017-09-01 DIAGNOSIS — M62830 Muscle spasm of back: Secondary | ICD-10-CM

## 2017-09-01 DIAGNOSIS — R262 Difficulty in walking, not elsewhere classified: Secondary | ICD-10-CM

## 2017-09-01 NOTE — Therapy (Signed)
[facility] [address]  [place], [place], [address] Phone: [phone]   Fax:  [phone]  Physical Therapy Treatment  Patient Details  Name: Troy Mccann MRN: [id number] Date of Birth: Apr 03, 1941 Referring Provider: Laroy Apple, MD  Encounter Date: 09/01/2017      PT End of Session - 09/01/17 1055    Visit Number 7   Number of Visits 16   Date for PT Re-Evaluation 10/08/17   Authorization Type Aetna Medicare   PT Start Time 1055   PT Stop Time 1146   PT Time Calculation (min) 51 min   Activity Tolerance Patient tolerated treatment well   Behavior During Therapy [facility] for tasks assessed/performed      Past Medical History:  Diagnosis Date  . Abnormal PSA 01/12/2017  . BCC (basal cell carcinoma), face 01/12/2017  . BPH (benign prostatic hyperplasia) 04/15/2014  . Cataract 07/09/2015  . Hematuria 07/09/2015  . Medicare annual wellness visit, subsequent 10/21/2014  . Other and unspecified hyperlipidemia 04/15/2014  . SCC (squamous cell carcinoma) 10/21/2014  . Tick bite 2007   Never assessed by a physician.  Marland Kitchen Unspecified constipation 03/08/2013    No past surgical history on file.  There were no vitals filed for this visit.      Subjective Assessment - 09/01/17 1057    Subjective "Today is a better day - so good, I forgot to take my mediciine".   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 No/denies   Pain Score 0-No pain                         [facility] Adult PT Treatment/Exercise - 09/01/17 1055      Lumbar Exercises: Supine   Bent Knee Raise 15 reps;3 seconds   Bent Knee Raise Limitations brace marching with green TB   Bridge 15 reps;5 seconds   Bridge Limitations + alt hip ABD/ER with red TB     Knee/Hip Exercises:  Aerobic   Nustep lvl 7 x 6'     Knee/Hip Exercises: Standing   Side Lunges Right;Left;10 reps;5 seconds;2 sets   Side Lunges Limitations extensive cueing for correct alignment and weight shift - initially attempted on TRX, but transitioned to counter support   Functional Squat 15 reps;5 seconds   Functional Squat Limitations TRX   Other Standing Knee Exercises B side-stepping and fwd/back monster walk with looped red TB at ankles 4 x 1ft; intermittent UE support along edge of counter                PT Education - 09/01/17 1144    Education Details HEP progression - green TB for brace marching and clams   Person(s) Educated Patient   Methods Explanation;Demonstration   Comprehension Verbalized understanding;Returned demonstration          PT Short Term Goals - 09/01/17 1058      PT SHORT TERM GOAL #1   Title Pt will have a basic understanding of flexibility, core stabilization and strengthening HEP   Status Achieved           PT Long Term Goals - 08/17/17 0949      PT LONG TERM GOAL #1   Title Independent with ongoing HEP +/- gym  program   Status On-going     PT LONG TERM GOAL #2   Title Pt will report a 50% improvement in pain and function with daily activities.     Status On-going     PT LONG TERM GOAL #3   Title B hip strength >/= 4/5 for improved stability to allow for increased standing and walking tolerance   Status On-going     PT LONG TERM GOAL #4   Title Pt will report ability to resume normal level of activity within home and at the gym w/o limitation due to increased LBP or radicular symptoms   Status On-going               Plan - 09/01/17 1059    Clinical Impression Statement Pt reporting continued improvement in pain levels to the point where he doesn't think to take his pain medicine. Able to progress TB resistance with most of HEP exercises as well as progress complexity of exercise during session, but pt continues to require  repeated instructions/cueing for correct technique with many exercises therefore no new exercises added to HEP. Pt inquiring about increased use of gym equipment, therefore will provide training in relevant equipment over next few visits.   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; chronic back pain    PT Treatment/Interventions Patient/family education;Therapeutic exercise;Neuromuscular re-education;Therapeutic activities;Functional mobility training;Manual techniques;Dry needling;Taping;Electrical Stimulation;Moist Heat;ADLs/Self Care Home Management   Consulted and Agree with Plan of Care Patient      Patient will benefit from skilled therapeutic intervention in order to improve the following deficits and impairments:  Pain, Increased muscle spasms, Impaired sensation, Decreased strength, Postural dysfunction, Improper body mechanics, Impaired flexibility, Impaired tone, Decreased range of motion, Decreased activity tolerance, Difficulty walking, Abnormal gait  Visit Diagnosis: Chronic right-sided low back pain with right-sided sciatica  Muscle weakness (generalized)  Muscle spasm of back  Difficulty in walking, not elsewhere classified     Problem List Patient Active Problem List   Diagnosis Date Noted  . Right calf pain 07/13/2017  . BCC (basal cell carcinoma), face 01/12/2017  . Abnormal PSA 01/12/2017  . Hematuria 07/09/2015  . Cataract 07/09/2015  . Preventative health care 10/21/2014  . SCC (squamous cell carcinoma) 10/21/2014  . BPH (benign prostatic hyperplasia) 04/15/2014  . Hyperlipidemia 04/15/2014  . Constipation 03/08/2013  . Elevated blood pressure 11/08/2012  . Arthralgia 09/05/2012  . Weight loss 09/05/2012  . Chronic back pain 09/05/2012    Percival Spanish, PT, MPT 09/01/2017, 12:52 PM  [facility] [address]  [place], [place], [address] Phone: [phone]   Fax:  [phone]  Name: Troy Mccann MRN: [id number] Date of Birth: 13-Aug-1941

## 2017-09-07 ENCOUNTER — Ambulatory Visit: Payer: Medicare HMO | Attending: Physical Medicine and Rehabilitation

## 2017-09-07 DIAGNOSIS — R262 Difficulty in walking, not elsewhere classified: Secondary | ICD-10-CM | POA: Insufficient documentation

## 2017-09-07 DIAGNOSIS — M5441 Lumbago with sciatica, right side: Secondary | ICD-10-CM | POA: Insufficient documentation

## 2017-09-07 DIAGNOSIS — M6281 Muscle weakness (generalized): Secondary | ICD-10-CM

## 2017-09-07 DIAGNOSIS — G8929 Other chronic pain: Secondary | ICD-10-CM | POA: Diagnosis present

## 2017-09-07 DIAGNOSIS — M62830 Muscle spasm of back: Secondary | ICD-10-CM | POA: Diagnosis present

## 2017-09-07 NOTE — Therapy (Signed)
[facility] [address]  [place], [place], [address] Phone: [phone]   Fax:  [phone]  Physical Therapy Treatment  Patient Details  Name: Troy Mccann MRN: [id number] Date of Birth: 1941/06/17 Referring Provider: Laroy Apple, MD  Encounter Date: 09/07/2017      PT End of Session - 09/07/17 0934    Visit Number 8   Number of Visits 16   Date for PT Re-Evaluation 10/08/17   Authorization Type [employer] Medicare   PT Start Time [phone]   PT Stop Time 1015   PT Time Calculation (min) 50 min   Activity Tolerance Patient tolerated treatment well   Behavior During Therapy [facility] for tasks assessed/performed      Past Medical History:  Diagnosis Date  . Abnormal PSA 01/12/2017  . BCC (basal cell carcinoma), face 01/12/2017  . BPH (benign prostatic hyperplasia) 04/15/2014  . Cataract 07/09/2015  . Hematuria 07/09/2015  . Medicare annual wellness visit, subsequent 10/21/2014  . Other and unspecified hyperlipidemia 04/15/2014  . SCC (squamous cell carcinoma) 10/21/2014  . Tick bite 2007   Never assessed by a physician.  Marland Kitchen Unspecified constipation 03/08/2013    No past surgical history on file.  There were no vitals filed for this visit.      Subjective Assessment - 09/07/17 0928    Subjective Pt. doing well.  Noting green TB has been "challanging" with updated HEP.     Patient Stated Goals "to be able to feel better so I can get back to yard work and working out"   Currently in Pain? Yes   Pain Score 2    Pain Location Leg   Pain Orientation Right   Pain Descriptors / Indicators Numbness;Tingling   Pain Type Acute pain   Pain Radiating Towards numbness and tingling down posterior R LE to foot    Pain Onset More than a month ago   Pain Frequency Intermittent   Aggravating Factors  mornings upon rising from bed    Pain Relieving Factors gabapendtin    Multiple Pain Sites No                          [facility] Adult PT Treatment/Exercise - 09/07/17 0955      Lumbar Exercises: Machines for Strengthening   Cybex Knee Flexion 15# 2 x 10 reps; B LE's    Other Lumbar Machine Exercise BATCA low row #10 x 15 reps     Knee/Hip Exercises: Aerobic   Recumbent Bike lvl 2 x 7'     Knee/Hip Exercises: Standing   Heel Raises 2 sets;15 reps   Heel Raises Limitations at counter    Forward Lunges Right;Left;10 reps;3 seconds   Forward Lunges Limitations mod cueing for technique holding onto TM 1 UE    Functional Squat 15 reps;5 seconds   Functional Squat Limitations TRX   Other Standing Knee Exercises B side-stepping and fwd/back monster walk with looped red TB at ankles 4 x 15 ft; at counter    Other Standing Knee Exercises B forward, lateral toe-clears with 2# to 9" stool x 15 rep each; intermittent 1 UE support on chair      Knee/Hip Exercises: Seated   Other Seated Knee/Hip Exercises B fitter leg press (1 blue, 1 black) x 15 reps each side                 PT Education - 09/07/17 1041  Education provided Yes   Education Details machine: nustep, recumbent bike, low row, hamstring curl    Person(s) Educated Patient   Methods Explanation;Demonstration;Verbal cues;Handout   Comprehension Verbalized understanding;Returned demonstration;Verbal cues required;Need further instruction          PT Short Term Goals - 09/01/17 1058      PT SHORT TERM GOAL #1   Title Pt will have a basic understanding of flexibility, core stabilization and strengthening HEP   Status Achieved           PT Long Term Goals - 08/17/17 0949      PT LONG TERM GOAL #1   Title Independent with ongoing HEP +/- gym program   Status On-going     PT LONG TERM GOAL #2   Title Pt will report a 50% improvement in pain and function with daily activities.     Status On-going     PT LONG TERM GOAL #3   Title B hip strength >/= 4/5 for improved stability to allow for increased standing and walking tolerance    Status On-going     PT LONG TERM GOAL #4   Title Pt will report ability to resume normal level of activity within home and at the gym w/o limitation due to increased LBP or radicular symptoms   Status On-going               Plan - 09/07/17 1037    Clinical Impression Statement Pt. reporting pain is still intermittent throughout day however still has numbness/tingling into R LE/foot, which is worst in mornings.  Reports overall improvement in pain levels with therapy.  Notes, "they worked me hard last time" however was not sore next day following treatment.  Given good overall response to past therex progressions, treatment with mild advancement functional strengthening today focusing on review of machine strengthening activities for use at local gym.  Pt. issued initial machine strengthening program via handout including only activities that he have been easily able to perform in treatment.  Pt. progressing well with therapy.     PT Treatment/Interventions Patient/family education;Therapeutic exercise;Neuromuscular re-education;Therapeutic activities;Functional mobility training;Manual techniques;Dry needling;Taping;Electrical Stimulation;Moist Heat;ADLs/Self Care Home Management      Patient will benefit from skilled therapeutic intervention in order to improve the following deficits and impairments:  Pain, Increased muscle spasms, Impaired sensation, Decreased strength, Postural dysfunction, Improper body mechanics, Impaired flexibility, Impaired tone, Decreased range of motion, Decreased activity tolerance, Difficulty walking, Abnormal gait  Visit Diagnosis: Chronic right-sided low back pain with right-sided sciatica  Muscle weakness (generalized)  Muscle spasm of back  Difficulty in walking, not elsewhere classified     Problem List Patient Active Problem List   Diagnosis Date Noted  . Right calf pain 07/13/2017  . BCC (basal cell carcinoma), face 01/12/2017  . Abnormal  PSA 01/12/2017  . Hematuria 07/09/2015  . Cataract 07/09/2015  . Preventative health care 10/21/2014  . SCC (squamous cell carcinoma) 10/21/2014  . BPH (benign prostatic hyperplasia) 04/15/2014  . Hyperlipidemia 04/15/2014  . Constipation 03/08/2013  . Elevated blood pressure 11/08/2012  . Arthralgia 09/05/2012  . Weight loss 09/05/2012  . Chronic back pain 09/05/2012    Bess Harvest, PTA 09/07/17 10:51 AM  [facility] [address]  [place], [place], [address] Phone: [phone]   Fax:  [phone]  Name: Troy Mccann MRN: [id number] Date of Birth: 1941-11-12

## 2017-09-10 ENCOUNTER — Ambulatory Visit: Payer: Medicare HMO | Admitting: Physical Therapy

## 2017-09-10 DIAGNOSIS — M6281 Muscle weakness (generalized): Secondary | ICD-10-CM

## 2017-09-10 DIAGNOSIS — M62830 Muscle spasm of back: Secondary | ICD-10-CM

## 2017-09-10 DIAGNOSIS — M5441 Lumbago with sciatica, right side: Secondary | ICD-10-CM | POA: Diagnosis not present

## 2017-09-10 DIAGNOSIS — R262 Difficulty in walking, not elsewhere classified: Secondary | ICD-10-CM

## 2017-09-10 DIAGNOSIS — G8929 Other chronic pain: Secondary | ICD-10-CM

## 2017-09-10 NOTE — Therapy (Signed)
[facility] [address]  [place], [place], [address] Phone: [phone]   Fax:  [phone]  Physical Therapy Treatment  Patient Details  Name: Troy Mccann MRN: [id number] Date of Birth: [birth year] Referring Provider: Laroy Apple, MD  Encounter Date: 09/10/2017      PT End of Session - 09/10/17 0925    Visit Number 9   Number of Visits 16   Date for PT Re-Evaluation 10/08/17   Authorization Type [employer] Medicare   PT Start Time [phone]   PT Stop Time 1017   PT Time Calculation (min) 52 min   Activity Tolerance Patient tolerated treatment well   Behavior During Therapy [facility] for tasks assessed/performed      Past Medical History:  Diagnosis Date  . Abnormal PSA 01/12/2017  . BCC (basal cell carcinoma), face 01/12/2017  . BPH (benign prostatic hyperplasia) 04/15/2014  . Cataract 07/09/2015  . Hematuria 07/09/2015  . Medicare annual wellness visit, subsequent 10/21/2014  . Other and unspecified hyperlipidemia 04/15/2014  . SCC (squamous cell carcinoma) 10/21/2014  . Tick bite 2007   Never assessed by a physician.  Marland Kitchen Unspecified constipation 03/08/2013    No past surgical history on file.  There were no vitals filed for this visit.      Subjective Assessment - 09/10/17 0927    Subjective Pt. doing well.  Noting green TB has been "challanging" with updated HEP.     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 Yes   Pain Score 2    Pain Location Leg   Pain Orientation Right   Pain Descriptors / Indicators Numbness;Tingling  "to a lesser degree"   Pain Type Acute pain   Pain Radiating Towards numbness & tingling in posterior R LE to foot   Pain Onset More than a month ago   Pain Frequency Intermittent            [facility] PT Assessment - 09/10/17  0925      Assessment   Next MD Visit 09/16/17                     [facility] Adult PT Treatment/Exercise - 09/10/17 1448      Ambulation/Gait   Ambulation Distance (Feet) 180 Feet   Assistive device Straight cane   Gait Pattern Decreased hip/knee flexion - left;Decreased dorsiflexion - left;Left circumduction;Poor foot clearance - left   Gait Comments Provided cues for increased L hip/knee flexion to promote improved foot clearance w/o circumduction during gait.     Lumbar Exercises: Stretches   Passive Hamstring Stretch 30 seconds;2 reps   Passive Hamstring Stretch Limitations seated with felxion   Quadruped Mid Back Stretch 30 seconds;2 reps   Quadruped Mid Back Stretch Limitations seated 3 way prayer stretch with green Pball     Knee/Hip Exercises: Aerobic   Recumbent Bike lvl 3 x 6'     Knee/Hip Exercises: Standing   Heel Raises 20 reps   Heel Raises Limitations heel-toe on Airex pad   Hip Flexion Both;20 reps;Knee bent;Stengthening   Hip Flexion Limitations 2#, marching on Airex pad; intermittent single UE support   Functional Squat 15 reps;3 seconds   Functional Squat Limitations minisquat on Airex pad; intermittent UE support on back of chair  SLS with Vectors SLS on blue foam oval with toe tap to 3 balance   Other Standing Knee Exercises Alt toe clears from Airex pad to 9" stool, 2# at ankles x20; intermittent single UE support on back of chair     Knee/Hip Exercises: Seated   Other Seated Knee/Hip Exercises B fitter leg press (2 black) x15 reps each                   PT Short Term Goals - 09/01/17 1058      PT SHORT TERM GOAL #1   Title Pt will have a basic understanding of flexibility, core stabilization and strengthening HEP   Status Achieved           PT Long Term Goals - 08/17/17 0949      PT LONG TERM GOAL #1   Title Independent with ongoing HEP +/- gym program   Status On-going     PT LONG TERM GOAL #2   Title Pt will report a  50% improvement in pain and function with daily activities.     Status On-going     PT LONG TERM GOAL #3   Title B hip strength >/= 4/5 for improved stability to allow for increased standing and walking tolerance   Status On-going     PT LONG TERM GOAL #4   Title Pt will report ability to resume normal level of activity within home and at the gym w/o limitation due to increased LBP or radicular symptoms   Status On-going               Plan - 09/10/17 0929    Clinical Impression Statement Pt feels like therapy is helping with decreasing intensity of pain and radicular symptoms, but still experiencing numbness & tingling along posterior R LE to foot. Pt feeling stronger, noting original resistance bands proved for HEP are now "child's play". PT noting some continued diffilculty with sense of balance/stability when walking outdoors, esp on uneven or compliant surfaces, therefore incorporated compliant surfaces into many of therapeutic exercises today.    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; chronic back pain    PT Treatment/Interventions Patient/family education;Therapeutic exercise;Neuromuscular re-education;Therapeutic activities;Functional mobility training;Manual techniques;Dry needling;Taping;Electrical Stimulation;Moist Heat;ADLs/Self Care Home Management   PT Next Visit Plan 10th visit FOTO, G-code & MD progress note (appt 09/16/17)   Consulted and Agree with Plan of Care Patient      Patient will benefit from skilled therapeutic intervention in order to improve the following deficits and impairments:  Pain, Increased muscle spasms, Impaired sensation, Decreased strength, Postural dysfunction, Improper body mechanics, Impaired flexibility, Impaired tone, Decreased range of motion, Decreased activity tolerance, Difficulty walking, Abnormal gait  Visit Diagnosis: Chronic  right-sided low back pain with right-sided sciatica  Muscle weakness (generalized)  Muscle spasm of back  Difficulty in walking, not elsewhere classified     Problem List Patient Active Problem List   Diagnosis Date Noted  . Right calf pain 07/13/2017  . BCC (basal cell carcinoma), face 01/12/2017  . Abnormal PSA 01/12/2017  . Hematuria 07/09/2015  . Cataract 07/09/2015  . Preventative health care 10/21/2014  . SCC (squamous cell carcinoma) 10/21/2014  . BPH (benign prostatic hyperplasia) 04/15/2014  . Hyperlipidemia 04/15/2014  . Constipation 03/08/2013  . Elevated blood pressure 11/08/2012  . Arthralgia 09/05/2012  . Weight loss 09/05/2012  . Chronic back pain 09/05/2012  Percival Spanish, PT, MPT 09/10/2017, 10:48 AM  [facility] [address]  [place], [place], [address] Phone: [phone]   Fax:  [phone]  Name: Troy Mccann MRN: [id number] Date of Birth: [birth year]

## 2017-09-13 DIAGNOSIS — D0421 Carcinoma in situ of skin of right ear and external auricular canal: Secondary | ICD-10-CM | POA: Diagnosis not present

## 2017-09-13 DIAGNOSIS — L219 Seborrheic dermatitis, unspecified: Secondary | ICD-10-CM | POA: Diagnosis not present

## 2017-09-14 ENCOUNTER — Ambulatory Visit: Payer: Medicare HMO

## 2017-09-14 DIAGNOSIS — R262 Difficulty in walking, not elsewhere classified: Secondary | ICD-10-CM

## 2017-09-14 DIAGNOSIS — M62830 Muscle spasm of back: Secondary | ICD-10-CM

## 2017-09-14 DIAGNOSIS — M5441 Lumbago with sciatica, right side: Principal | ICD-10-CM

## 2017-09-14 DIAGNOSIS — G8929 Other chronic pain: Secondary | ICD-10-CM

## 2017-09-14 DIAGNOSIS — M6281 Muscle weakness (generalized): Secondary | ICD-10-CM

## 2017-09-14 NOTE — Therapy (Signed)
[facility] [address]  [place], [place], [address] Phone: [phone]   Fax:  [phone]  Physical Therapy Treatment  Patient Details  Name: Troy Mccann MRN: [id number] Date of Birth: [birth year] Referring Provider: Laroy Apple, MD  Encounter Date: 09/14/2017      PT End of Session - 09/14/17 1034    Visit Number 10   Number of Visits 16   Date for PT Re-Evaluation 10/08/17   Authorization Type A[employer]na Medicare   PT Start Time 0927   PT Stop Time 1015   PT Time Calculation (min) 48 min   Activity Tolerance Patient tolerated treatment well   Behavior During Therapy [facility] for tasks assessed/performed      Past Medical History:  Diagnosis Date  . Abnormal PSA 01/12/2017  . BCC (basal cell carcinoma), face 01/12/2017  . BPH (benign prostatic hyperplasia) 04/15/2014  . Cataract 07/09/2015  . Hematuria 07/09/2015  . Medicare annual wellness visit, subsequent 10/21/2014  . Other and unspecified hyperlipidemia 04/15/2014  . SCC (squamous cell carcinoma) 10/21/2014  . Tick bite 2007   Never assessed by a physician.  Marland Kitchen Unspecified constipation 03/08/2013    No past surgical history on file.  There were no vitals filed for this visit.      Subjective Assessment - 09/14/17 0935    Subjective Pt. noting numbness/tingling is "progressively better" since starting therapy.     Patient Stated Goals "to be able to feel better so I can get back to yard work and working out"   Currently in Pain? Yes   Pain Score 2    Pain Location Leg   Pain Orientation Right   Pain Descriptors / Indicators Numbness;Tingling   Pain Type Acute pain   Pain Radiating Towards numbness and tingling into posterior R LE to foot    Pain Onset More than a month ago   Pain Frequency Intermittent   Aggravating Factors  morning    Pain Relieving Factors gabapendtin             [facility] PT Assessment - 09/14/17 0945      Observation/Other  Assessments   Focus on Therapeutic Outcomes (FOTO)  51% (49% limitation)     Strength   Strength Assessment Site Hip;Knee;Ankle   Right/Left Hip Right;Left   Right Hip Flexion 3+/5   Right Hip Extension 3+/5   Right Hip External Rotation  4/5   Right Hip Internal Rotation 4/5   Right Hip ABduction 4+/5   Right Hip ADduction 4/5   Left Hip Flexion 3+/5   Left Hip Extension 3+/5   Left Hip External Rotation 4/5   Left Hip Internal Rotation 4/5   Left Hip ABduction 4/5   Left Hip ADduction 4/5   Right/Left Knee Right;Left   Right Knee Flexion 4-/5   Right Knee Extension 4+/5   Left Knee Flexion 4-/5   Left Knee Extension 4/5   Right/Left Ankle Right;Left   Right Ankle Dorsiflexion 4/5   Right Ankle Inversion 4/5   Right Ankle Eversion 4/5   Left Ankle Dorsiflexion 4/5   Left Ankle Inversion 4-/5   Left Ankle Eversion 4-/5                     [facility] Adult PT Treatment/Exercise - 09/14/17 1008      Lumbar Exercises: Standing   Heel Raises 20 reps;3 seconds   Heel Raises Limitations at UBE     Knee/Hip  Exercises: Aerobic   Recumbent Bike lvl 3 x 6'     Knee/Hip Exercises: Standing   Heel Raises 20 reps   Heel Raises Limitations heel-toe on Airex pad   Functional Squat 15 reps;3 seconds   Functional Squat Limitations minisquat on Airex pad; intermittent UE support on back of chair  tactile cueing for posterior wt. shift    Other Standing Knee Exercises Alternating mini lunge onto BOSU ball (up) with reach to cone on foam bolster x 15 reps each side 1# at ankles; with SPC    Other Standing Knee Exercises Alternating toe-touch with 2# to cones x 20 reps each side standing on airex pad; 1 light HH support from therapist                   PT Short Term Goals - 09/01/17 1058      PT SHORT TERM GOAL #1   Title Pt will have a basic understanding of flexibility, core stabilization and strengthening HEP   Status Achieved           PT Long Term  Goals - 09/14/17 0942      PT LONG TERM GOAL #1   Title Independent with ongoing HEP +/- gym program   Status Partially Met  met for current HEP      PT LONG TERM GOAL #2   Title Pt will report a 50% improvement in pain and function with daily activities.     Status On-going  40% improvement      PT LONG TERM GOAL #3   Title B hip strength >/= 4/5 for improved stability to allow for increased standing and walking tolerance   Status Partially Met     PT LONG TERM GOAL #4   Title Pt will report ability to resume normal level of activity within home and at the gym w/o limitation due to increased LBP or radicular symptoms   Status On-going               Plan - 09/14/17 [place] doing well today.  Notes he feels he has had a "Progressive decrease" in radicular symptoms since starting therapy.  Partially met hip strength goal today with greatest remaining weakness in B flexor and extensor groups.  Pt. reporting a 40% improvement in pain levels and function since starting therapy and verbalizing he feels he is making progress with PT.  When pt. asked when he plans to return to regular gym activities at [facility], pt. reports he is "waiting for legs to feel better".  Remaining time in treatment focused on standing hip strengthening for hip flexors and extensors on compliant surfaces.  Pt. progressing well at this point and will continue to benefit from further skilled therapy to maximize function and encourage return to regular exercise routine.   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; chronic back pain    PT Treatment/Interventions Patient/family education;Therapeutic exercise;Neuromuscular re-education;Therapeutic activities;Functional mobility training;Manual techniques;Dry needling;Taping;Electrical Stimulation;Moist Heat;ADLs/Self Care Home Management      Patient will  benefit from skilled therapeutic intervention in order to improve the following deficits and impairments:  Pain, Increased muscle spasms, Impaired sensation, Decreased strength, Postural dysfunction, Improper body mechanics, Impaired flexibility, Impaired tone, Decreased range of motion, Decreased activity tolerance, Difficulty walking, Abnormal gait  Visit Diagnosis: Chronic right-sided low back pain with right-sided sciatica  Muscle weakness (generalized)  Muscle spasm of back  Difficulty in walking, not elsewhere classified       G-Codes - 2017/10/08 1027    Functional Assessment Tool Used (Outpatient Only) Lumbar spine FOTO = 51% (49% limitation)    Functional Limitation Mobility: Walking and moving around   Mobility: Walking and Moving Around Current Status ([id number]) At least 40 percent but less than 60 percent impaired, limited or restricted   Mobility: Walking and Moving Around Goal Status [phone]) At least 40 percent but less than 60 percent impaired, limited or restricted      Problem List Patient Active Problem List   Diagnosis Date Noted  . Right calf pain 07/13/2017  . BCC (basal cell carcinoma), face 01/12/2017  . Abnormal PSA 01/12/2017  . Hematuria 07/09/2015  . Cataract 07/09/2015  . Preventative health care 10/21/2014  . SCC (squamous cell carcinoma) 10/21/2014  . BPH (benign prostatic hyperplasia) 04/15/2014  . Hyperlipidemia 04/15/2014  . Constipation 03/08/2013  . Elevated blood pressure 11/08/2012  . Arthralgia 09/05/2012  . Weight loss 09/05/2012  . Chronic back pain 09/05/2012    Bess Harvest, PTA October 08, 2017 12:05 PM  [facility] [address]  [place], [place], [address] Phone: [phone]   Fax:  [phone]  Name: Troy Mccann MRN: [id number] Date of Birth: [birth year]   Percival Spanish, PT, MPT 10-08-2017, 12:05 PM  [facility] [address]  [place], [place], [address] Phone: [phone]   Fax:  [phone]

## 2017-09-16 DIAGNOSIS — M5416 Radiculopathy, lumbar region: Secondary | ICD-10-CM | POA: Diagnosis not present

## 2017-09-16 DIAGNOSIS — M48061 Spinal stenosis, lumbar region without neurogenic claudication: Secondary | ICD-10-CM | POA: Diagnosis not present

## 2017-09-16 DIAGNOSIS — M545 Low back pain: Secondary | ICD-10-CM | POA: Diagnosis not present

## 2017-09-16 DIAGNOSIS — M4120 Other idiopathic scoliosis, site unspecified: Secondary | ICD-10-CM | POA: Diagnosis not present

## 2017-09-17 ENCOUNTER — Ambulatory Visit: Payer: Medicare HMO | Admitting: Physical Therapy

## 2017-09-21 ENCOUNTER — Ambulatory Visit: Payer: Medicare HMO

## 2017-09-21 DIAGNOSIS — R262 Difficulty in walking, not elsewhere classified: Secondary | ICD-10-CM

## 2017-09-21 DIAGNOSIS — G8929 Other chronic pain: Secondary | ICD-10-CM

## 2017-09-21 DIAGNOSIS — M5441 Lumbago with sciatica, right side: Principal | ICD-10-CM

## 2017-09-21 DIAGNOSIS — M62830 Muscle spasm of back: Secondary | ICD-10-CM

## 2017-09-21 DIAGNOSIS — M6281 Muscle weakness (generalized): Secondary | ICD-10-CM
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
## 2017-09-24 ENCOUNTER — Ambulatory Visit: Payer: Medicare HMO | Admitting: Physical Therapy

## 2017-09-24 DIAGNOSIS — M5441 Lumbago with sciatica, right side: Principal | ICD-10-CM

## 2017-09-24 DIAGNOSIS — G8929 Other chronic pain: Secondary | ICD-10-CM

## 2017-09-24 DIAGNOSIS — M6281 Muscle weakness (generalized): Secondary | ICD-10-CM

## 2017-09-24 DIAGNOSIS — M62830 Muscle spasm of back: Secondary | ICD-10-CM

## 2017-09-24 DIAGNOSIS — R262 Difficulty in walking, not elsewhere classified: Secondary | ICD-10-CM
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
## 2017-09-28 ENCOUNTER — Ambulatory Visit: Payer: Medicare HMO

## 2017-09-28 DIAGNOSIS — M5441 Lumbago with sciatica, right side: Principal | ICD-10-CM

## 2017-09-28 DIAGNOSIS — R262 Difficulty in walking, not elsewhere classified: Secondary | ICD-10-CM

## 2017-09-28 DIAGNOSIS — M6281 Muscle weakness (generalized): Secondary | ICD-10-CM

## 2017-09-28 DIAGNOSIS — M62830 Muscle spasm of back: Secondary | ICD-10-CM

## 2017-09-28 DIAGNOSIS — G8929 Other chronic pain: Secondary | ICD-10-CM

## 2017-09-28 NOTE — Therapy (Signed)
[facility] [address]  [place], [place], [address] Phone: [phone]   Fax:  [phone]  Physical Therapy Treatment  Patient Details  Name: Troy Mccann MRN: [id number] Date of Birth: [birth year] Referring Provider: Laroy Apple, MD  Encounter Date: 09/28/2017      PT End of Session - 09/28/17 0933    Visit Number 13   Number of Visits 16   Date for PT Re-Evaluation 10/08/17   Authorization Type Aetna Medicare   PT Start Time [id number]   PT Stop Time 1010   PT Time Calculation (min) 46 min   Activity Tolerance Patient tolerated treatment well   Behavior During Therapy [facility] for tasks assessed/performed      Past Medical History:  Diagnosis Date  . Abnormal PSA 01/12/2017  . BCC (basal cell carcinoma), face 01/12/2017  . BPH (benign prostatic hyperplasia) 04/15/2014  . Cataract 07/09/2015  . Hematuria 07/09/2015  . Medicare annual wellness visit, subsequent 10/21/2014  . Other and unspecified hyperlipidemia 04/15/2014  . SCC (squamous cell carcinoma) 10/21/2014  . Tick bite 2007   Never assessed by a physician.  Marland Kitchen Unspecified constipation 03/08/2013    No past surgical history on file.  There were no vitals filed for this visit.      Subjective Assessment - 09/28/17 0928    Subjective Pt. reporting he is feeling like he will soon be ready to attemp return to local gym [facility]).   Patient Stated Goals "to be able to feel better so I can get back to yard work and working out"   Currently in Pain? Yes   Pain Score 1    Pain Location Back   Pain Orientation Lower   Pain Descriptors / Indicators Numbness;Tingling   Pain Type Acute pain   Pain Radiating Towards numbness and tingling into posterior R LE to foot    Pain Onset More than a month ago   Pain Frequency Intermittent   Aggravating Factors  mornins   Pain Relieving Factors gabapendtin    Multiple Pain Sites No                          [facility] Adult PT Treatment/Exercise - 09/28/17 1000      Neuro Re-ed    Neuro Re-ed Details  at counter with therapist supervision; toe walk, heel walk, side stepping, backward walk, high knee march 3# x 2 laps each      Lumbar Exercises: Machines for Strengthening   Other Lumbar Machine Exercise BATCA low row #15 3" x 15 reps   Other Lumbar Machine Exercise Single arm low machine row 10# x 10 reps each side      Lumbar Exercises: Standing   Heel Raises 20 reps;3 seconds   Heel Raises Limitations at UBE     Lumbar Exercises: Supine   Bridge 15 reps;5 seconds   Bridge Limitations + hip isometric abd into green TB   Other Supine Lumbar Exercises Hooklying march with green looped TB at knees x 15 reps     Lumbar Exercises: Quadruped   Straight Leg Raise 10 reps;3 seconds   Straight Leg Raises Limitations peanut p-ball      Knee/Hip Exercises: Aerobic   Recumbent Bike lvl 3 x 6'                  PT Short Term Goals - 09/01/17 1058      PT SHORT  TERM GOAL #1   Title Pt will have a basic understanding of flexibility, core stabilization and strengthening HEP   Status Achieved           PT Long Term Goals - 09/14/17 0942      PT LONG TERM GOAL #1   Title Independent with ongoing HEP +/- gym program   Status Partially Met  met for current HEP      PT LONG TERM GOAL #2   Title Pt will report a 50% improvement in pain and function with daily activities.     Status On-going  40% improvement      PT LONG TERM GOAL #3   Title B hip strength >/= 4/5 for improved stability to allow for increased standing and walking tolerance   Status Partially Met     PT LONG TERM GOAL #4   Title Pt will report ability to resume normal level of activity within home and at the gym w/o limitation due to increased LBP or radicular symptoms   Status On-going               Plan - 09/28/17 0937    Clinical Impression Statement Kipper reporting he feels he will soon be ready to  return to local gym [facility]) soon.  Tolerated all standing and balance and LE strengthening activities well today.  Does requiring frequent cueing for proper technique and pacing with activities.  Making good progress toward goals.   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; chronic back pain    PT Treatment/Interventions Patient/family education;Therapeutic exercise;Neuromuscular re-education;Therapeutic activities;Functional mobility training;Manual techniques;Dry needling;Taping;Electrical Stimulation;Moist Heat;ADLs/Self Care Home Management      Patient will benefit from skilled therapeutic intervention in order to improve the following deficits and impairments:  Pain, Increased muscle spasms, Impaired sensation, Decreased strength, Postural dysfunction, Improper body mechanics, Impaired flexibility, Impaired tone, Decreased range of motion, Decreased activity tolerance, Difficulty walking, Abnormal gait  Visit Diagnosis: Chronic right-sided low back pain with right-sided sciatica  Muscle weakness (generalized)  Muscle spasm of back  Difficulty in walking, not elsewhere classified     Problem List Patient Active Problem List   Diagnosis Date Noted  . Right calf pain 07/13/2017  . BCC (basal cell carcinoma), face 01/12/2017  . Abnormal PSA 01/12/2017  . Hematuria 07/09/2015  . Cataract 07/09/2015  . Preventative health care 10/21/2014  . SCC (squamous cell carcinoma) 10/21/2014  . BPH (benign prostatic hyperplasia) 04/15/2014  . Hyperlipidemia 04/15/2014  . Constipation 03/08/2013  . Elevated blood pressure 11/08/2012  . Arthralgia 09/05/2012  . Weight loss 09/05/2012  . Chronic back pain 09/05/2012    Bess Harvest, PTA 09/28/17 12:51 PM  [facility] [address]  [place], [place], [address] Phone: [phone]   Fax:   [phone]  Name: Troy Mccann MRN: [id number] Date of Birth: 06/13/1941

## 2017-10-01 ENCOUNTER — Ambulatory Visit: Payer: Medicare HMO | Admitting: Physical Therapy

## 2017-10-01 DIAGNOSIS — R262 Difficulty in walking, not elsewhere classified: Secondary | ICD-10-CM

## 2017-10-01 DIAGNOSIS — M5441 Lumbago with sciatica, right side: Principal | ICD-10-CM

## 2017-10-01 DIAGNOSIS — M6281 Muscle weakness (generalized): Secondary | ICD-10-CM

## 2017-10-01 DIAGNOSIS — G8929 Other chronic pain: Secondary | ICD-10-CM

## 2017-10-01 DIAGNOSIS — M62830 Muscle spasm of back: Secondary | ICD-10-CM

## 2017-10-01 NOTE — Therapy (Signed)
[facility] [address]  [place], [place], [address] Phone: [phone]   Fax:  [phone]  Physical Therapy Treatment  Patient Details  Name: Troy Mccann MRN: [id number] Date of Birth: Jan 16, 1941 Referring Provider: Laroy Apple, MD  Encounter Date: 10/01/2017      PT End of Session - 10/01/17 0930    Visit Number 14   Number of Visits 16   Date for PT Re-Evaluation 10/08/17   Authorization Type Aetna Medicare   PT Start Time 0930   PT Stop Time 1016   PT Time Calculation (min) 46 min   Activity Tolerance Patient tolerated treatment well   Behavior During Therapy [facility] for tasks assessed/performed      Past Medical History:  Diagnosis Date  . Abnormal PSA 01/12/2017  . BCC (basal cell carcinoma), face 01/12/2017  . BPH (benign prostatic hyperplasia) 04/15/2014  . Cataract 07/09/2015  . Hematuria 07/09/2015  . Medicare annual wellness visit, subsequent 10/21/2014  . Other and unspecified hyperlipidemia 04/15/2014  . SCC (squamous cell carcinoma) 10/21/2014  . Tick bite 2007   Never assessed by a physician.  Marland Kitchen Unspecified constipation 03/08/2013    No past surgical history on file.  There were no vitals filed for this visit.      Subjective Assessment - 10/01/17 0933    Subjective Pt reporting he is nearly finished with weaning the Gabapentin, and pain has remained well controlled - no back pain today and radicular pain almost gone.   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 Yes   Pain Score --  0.5/10   Pain Location Foot   Pain Orientation Right   Pain Type Acute pain   Pain Onset More than a month ago   Pain Frequency Intermittent                         [facility] Adult PT Treatment/Exercise - 10/01/17 0930      Lumbar Exercises: Machines for Strengthening   Cybex Knee Extension B LE 20# x15; B con/alt ecc 15# x10 each   Cybex Knee Flexion B LE 20# x15; B con/alt ecc 15# x10 each   Other Lumbar Machine Exercise BATCA low & mid row 25# x15 each; single arm low row 20# x15 each   Other Lumbar Machine Exercise BATCA pulldown 20# x15     Knee/Hip Exercises: Aerobic   Recumbent Bike lvl 3 x 6'                  PT Short Term Goals - 09/01/17 1058      PT SHORT TERM GOAL #1   Title Pt will have a basic understanding of flexibility, core stabilization and strengthening HEP   Status Achieved           PT Long Term Goals - 09/14/17 6948      PT LONG TERM GOAL #1   Title Independent with ongoing HEP +/- gym program   Status Partially Met  met for current HEP      PT LONG TERM GOAL #2   Title Pt will report a 50% improvement in pain and function with daily activities.     Status On-going  40% improvement  PT LONG TERM GOAL #3   Title B hip strength >/= 4/5 for improved stability to allow for increased standing and walking tolerance   Status Partially Met     PT LONG TERM GOAL #4   Title Pt will report ability to resume normal level of activity within home and at the gym w/o limitation due to increased LBP or radicular symptoms   Status On-going               Plan - 10/01/17 0938    Clinical Impression Statement Pt nearing end of current POC and preparing to transition to HEP & gym program. Focused on review of proper use of weight machines at gym, emphasizing positioning/posture and technique as well as pacing and hold times. Instructed pt to avoid overhead machines, esp machine creating compressive forces down through the spine (ie. shoulder press). Will plan to complete HEP review at next session in prep for anticipated discharge at the end of next week.   Rehab Potential Good   Clinical Impairments Affecting Rehab Potential 1963 - partial SCI (cord compression)  resulting from T11 compression fx; chronic back pain    PT Treatment/Interventions Patient/family education;Therapeutic exercise;Neuromuscular re-education;Therapeutic activities;Functional mobility training;Manual techniques;Dry needling;Taping;Electrical Stimulation;Moist Heat;ADLs/Self Care Home Management   Consulted and Agree with Plan of Care Patient      Patient will benefit from skilled therapeutic intervention in order to improve the following deficits and impairments:  Pain, Increased muscle spasms, Impaired sensation, Decreased strength, Postural dysfunction, Improper body mechanics, Impaired flexibility, Impaired tone, Decreased range of motion, Decreased activity tolerance, Difficulty walking, Abnormal gait  Visit Diagnosis: Chronic right-sided low back pain with right-sided sciatica  Muscle weakness (generalized)  Muscle spasm of back  Difficulty in walking, not elsewhere classified     Problem List Patient Active Problem List   Diagnosis Date Noted  . Right calf pain 07/13/2017  . BCC (basal cell carcinoma), face 01/12/2017  . Abnormal PSA 01/12/2017  . Hematuria 07/09/2015  . Cataract 07/09/2015  . Preventative health care 10/21/2014  . SCC (squamous cell carcinoma) 10/21/2014  . BPH (benign prostatic hyperplasia) 04/15/2014  . Hyperlipidemia 04/15/2014  . Constipation 03/08/2013  . Elevated blood pressure 11/08/2012  . Arthralgia 09/05/2012  . Weight loss 09/05/2012  . Chronic back pain 09/05/2012    Percival Spanish, PT, MPT 10/01/2017, 10:42 AM  [facility] [address]  [place], [place], [address] Phone: [phone]   Fax:  [phone]  Name: Troy Mccann MRN: [id number] Date of Birth: March 26, 1941

## 2017-10-05 ENCOUNTER — Ambulatory Visit: Payer: Medicare HMO

## 2017-10-08 ENCOUNTER — Ambulatory Visit: Payer: Medicare HMO | Attending: Physical Medicine and Rehabilitation

## 2017-10-08 DIAGNOSIS — M5441 Lumbago with sciatica, right side: Secondary | ICD-10-CM | POA: Insufficient documentation

## 2017-10-08 DIAGNOSIS — M6281 Muscle weakness (generalized): Secondary | ICD-10-CM

## 2017-10-08 DIAGNOSIS — G8929 Other chronic pain: Secondary | ICD-10-CM | POA: Insufficient documentation

## 2017-10-08 DIAGNOSIS — R262 Difficulty in walking, not elsewhere classified: Secondary | ICD-10-CM | POA: Diagnosis present

## 2017-10-08 DIAGNOSIS — M62830 Muscle spasm of back: Secondary | ICD-10-CM | POA: Diagnosis present

## 2017-10-08 NOTE — Therapy (Signed)
[facility] [address]  [place], [place], [address] Phone: [phone]   Fax:  [phone]  Physical Therapy Treatment  Patient Details  Name: Troy Mccann MRN: [id number] Date of Birth: [birth year] Referring Provider: Laroy Apple, MD  Encounter Date: 10/08/2017      PT End of Session - 10/08/17 0937    Visit Number 15   Number of Visits 16   Date for PT Re-Evaluation 10/08/17   Authorization Type [employer]   PT Start Time [phone]   PT Stop Time 1016   PT Time Calculation (min) 47 min   Activity Tolerance Patient tolerated treatment well   Behavior During Therapy [facility] for tasks assessed/performed      Past Medical History:  Diagnosis Date  . Abnormal PSA 01/12/2017  . BCC (basal cell carcinoma), face 01/12/2017  . BPH (benign prostatic hyperplasia) 04/15/2014  . Cataract 07/09/2015  . Hematuria 07/09/2015  . Medicare annual wellness visit, subsequent 10/21/2014  . Other and unspecified hyperlipidemia 04/15/2014  . SCC (squamous cell carcinoma) 10/21/2014  . Tick bite 2007   Never assessed by a physician.  Marland Kitchen Unspecified constipation 03/08/2013    No past surgical history on file.  There were no vitals filed for this visit.      Subjective Assessment - 10/08/17 0934    Subjective Pt. reporting he has now completely weaned off Gabapentin and hasn't noticed signficant difference since doing this.     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 Yes   Pain Score 1    Pain Location Hip   Pain Orientation Left   Pain Descriptors / Indicators Discomfort   Pain Type Acute pain   Pain Radiating Towards none today    Pain Onset More than a month ago   Pain Frequency Intermittent   Aggravating Factors  end of day   Pain Relieving Factors stretching    Multiple Pain Sites No            [facility] PT Assessment - 10/08/17 0944      Observation/Other Assessments   Focus on Therapeutic Outcomes (FOTO)  51% (49% limitation)     Strength   Strength Assessment Site Hip;Knee;Ankle   Right/Left Hip Right;Left   Right Hip Flexion 4-/5   Right Hip Extension 3+/5   Right Hip External Rotation  4/5   Right Hip Internal Rotation 4/5   Right Hip ABduction 4+/5   Right Hip ADduction 4/5   Left Hip Flexion 4-/5   Left Hip Extension 3+/5   Left Hip External Rotation 4/5   Left Hip Internal Rotation 4/5   Left Hip ABduction 4/5   Left Hip ADduction 4/5   Right/Left Knee Right;Left   Right Knee Flexion 4/5   Right Knee Extension 4+/5   Left Knee Flexion 4-/5   Left Knee Extension 4/5   Right/Left Ankle Right;Left   Right Ankle Dorsiflexion 4+/5   Right Ankle Inversion 4+/5   Right Ankle Eversion 4+/5   Left Ankle Dorsiflexion 4+/5   Left Ankle Inversion 4/5   Left Ankle Eversion 4+/5                     [facility] Adult PT Treatment/Exercise - 10/08/17 1033  Self-Care   Self-Care Other Self-Care Comments   Other Self-Care Comments  Review of final comprehensive HEP/gym program; Discussion of individual gym activities to check for appropriateness and to answer pt. remaining questions     Knee/Hip Exercises: Aerobic   Recumbent Bike lvl 3 x 6'                  PT Short Term Goals - 09/01/17 1058      PT SHORT TERM GOAL #1   Title Pt will have a basic understanding of flexibility, core stabilization and strengthening HEP   Status Achieved           PT Long Term Goals - 25-Oct-2017 0955      PT LONG TERM GOAL #1   Title Independent with ongoing HEP +/- gym program   Status Achieved     PT LONG TERM GOAL #2   Title Pt will report a 50% improvement in pain and function with daily activities.     Status Achieved  60% improvement      PT LONG TERM GOAL #3   Title B hip strength >/= 4/5 for improved  stability to allow for increased standing and walking tolerance   Status Partially Met     PT LONG TERM GOAL #4   Title Pt will report ability to resume normal level of activity within home and at the gym w/o limitation due to increased LBP or radicular symptoms   Status Partially Met  Still has not returned to gym                Plan - Oct 25, 2017 1026    Clinical Impression Statement Pt. today with last remaining visit in current POC and reporting he feels comfortable transitioning to home/gym program.  Performed final review of HEP/gym program with discussion of appropriate exercises at local [employer] to answer any further questions from pt.  Pt. reporting he has now been able to completely wean himself off Gabapentin and has not had noticeable difference with this.  Demonstrating good strength improvement today partially meeting goal however still weakest in B hip extensors.   Supervising PT speaking with pt. today in session to check for pt. comfort transitioning to home program with pt. confirming this.  Pt. has achieved or partially achieved all LTG's and reports plans to attempt returning to local gym for normal exercise routine soon.  Pt. now discharged from therapy.     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; chronic back pain    PT Treatment/Interventions Patient/family education;Therapeutic exercise;Neuromuscular re-education;Therapeutic activities;Functional mobility training;Manual techniques;Dry needling;Taping;Electrical Stimulation;Moist Heat;ADLs/Self Care Home Management   PT Next Visit Plan d/c       Patient will benefit from skilled therapeutic intervention in order to improve the following deficits and impairments:  Pain, Increased muscle spasms, Impaired sensation, Decreased strength, Postural dysfunction, Improper body mechanics, Impaired flexibility, Impaired tone, Decreased range of motion,  Decreased activity tolerance, Difficulty walking, Abnormal gait  Visit Diagnosis: Chronic right-sided low back pain with right-sided sciatica  Muscle weakness (generalized)  Muscle spasm of back  Difficulty in walking, not elsewhere classified       G-Codes - 2017-10-25 1032    Functional Assessment Tool Used (Outpatient Only) Lumbar spine FOTO = 51% (49% limitation)    Functional Limitation Mobility: Walking and moving around   Mobility: Walking and Moving Around Goal Status ([id number]) At least 40 percent but less than 60 percent  impaired, limited or restricted   Mobility: Walking and Moving Around Discharge Status [phone]) At least 40 percent but less than 60 percent impaired, limited or restricted      Problem List Patient Active Problem List   Diagnosis Date Noted  . Right calf pain 07/13/2017  . BCC (basal cell carcinoma), face 01/12/2017  . Abnormal PSA 01/12/2017  . Hematuria 07/09/2015  . Cataract 07/09/2015  . Preventative health care 10/21/2014  . SCC (squamous cell carcinoma) 10/21/2014  . BPH (benign prostatic hyperplasia) 04/15/2014  . Hyperlipidemia 04/15/2014  . Constipation 03/08/2013  . Elevated blood pressure 11/08/2012  . Arthralgia 09/05/2012  . Weight loss 09/05/2012  . Chronic back pain 09/05/2012     Bess Harvest, PTA 10/08/17 12:18 PM    [facility] [address]  [place], [place], [address] Phone: [phone]   Fax:  [phone]  Name: Troy Mccann MRN: [id number] Date of Birth: 1941/01/16    PHYSICAL THERAPY DISCHARGE SUMMARY  Visits from Start of Care: 15  Current functional level related to goals / functional outcomes:   Refer to above clinical impression.   Remaining deficits:   As above.   Education / Equipment:   HEP & gym program  Plan: Patient agrees to discharge.  Patient goals were partially met. Patient is being discharged due to being pleased with the  current functional level.  ?????    Percival Spanish, PT, MPT 10/08/17, 12:19 PM  [facility] [address]  [place], [place], [address] Phone: [phone]   Fax:  [phone]

## 2017-11-10 IMAGING — US US EXTREM LOW VENOUS*R*
1 series · 13 of 24 positions shown · non-contrast
Comparison: None.

CLINICAL DATA: Right calf pain. History of skin cancer. Evaluate
for DVT.



[Series 1: us extrem low venous*right* · 0.08mm/px · 28 acquisitions, 13 frames shown]
[im 1/28]
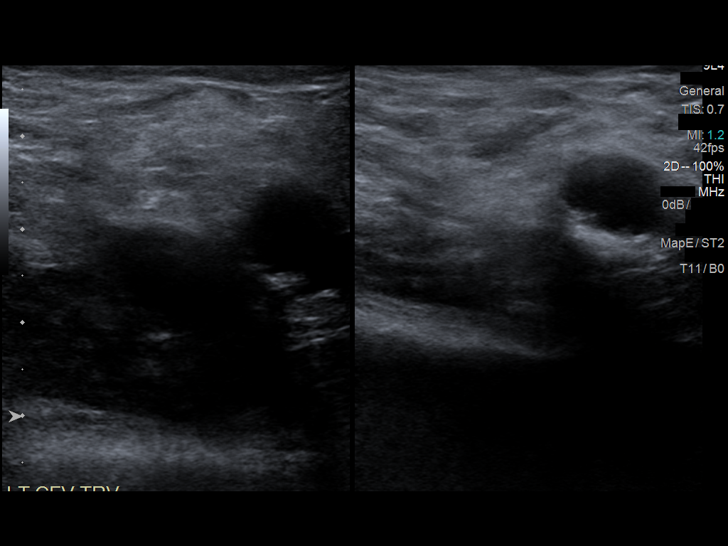
[im 3/28]
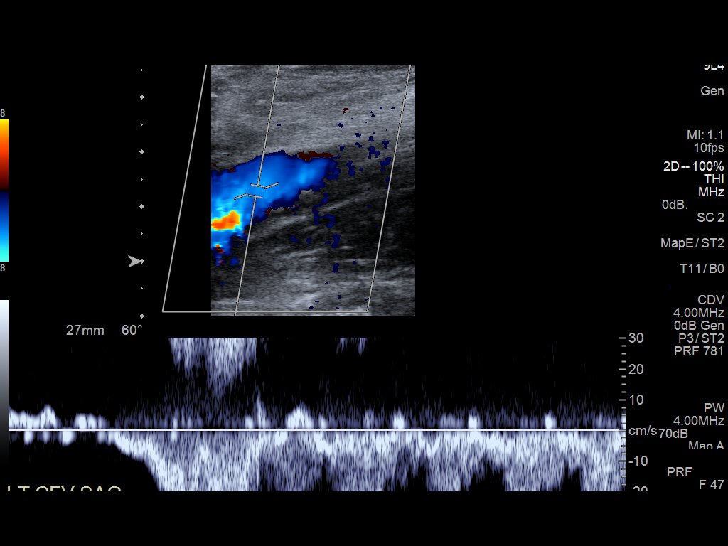
[im 5/28]
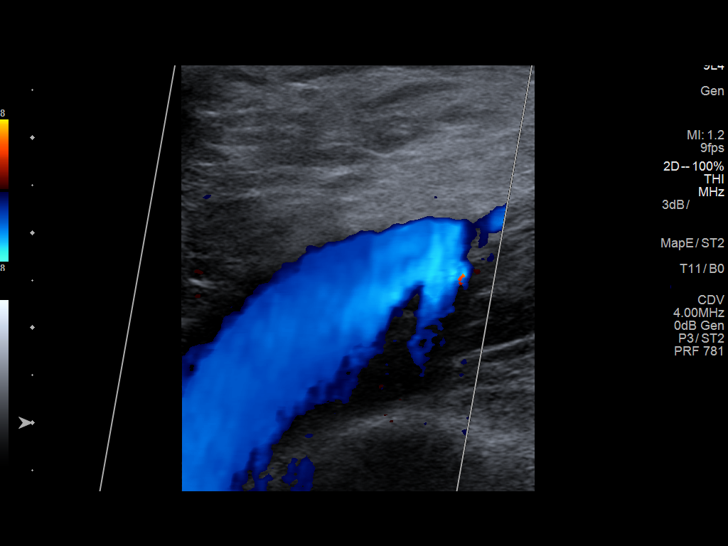
[im 8/28]
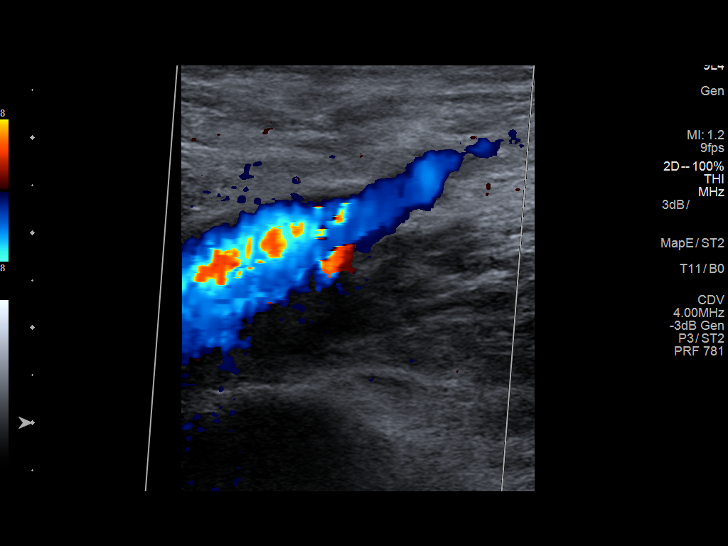
[im 10/28]
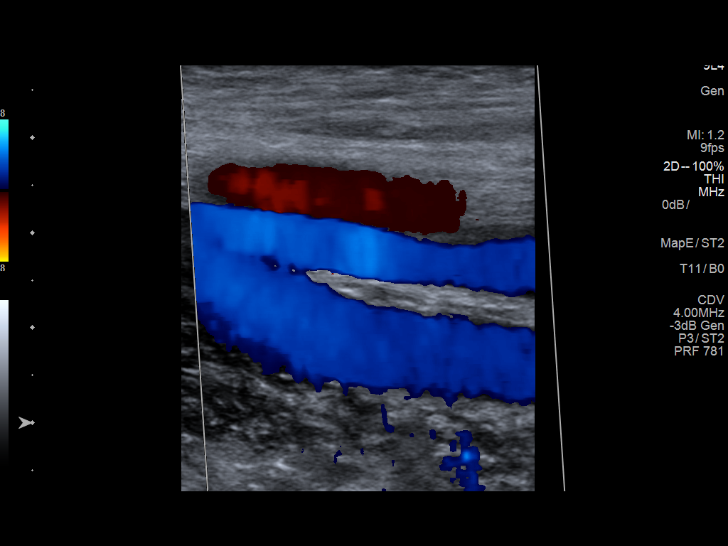
[im 12/28]
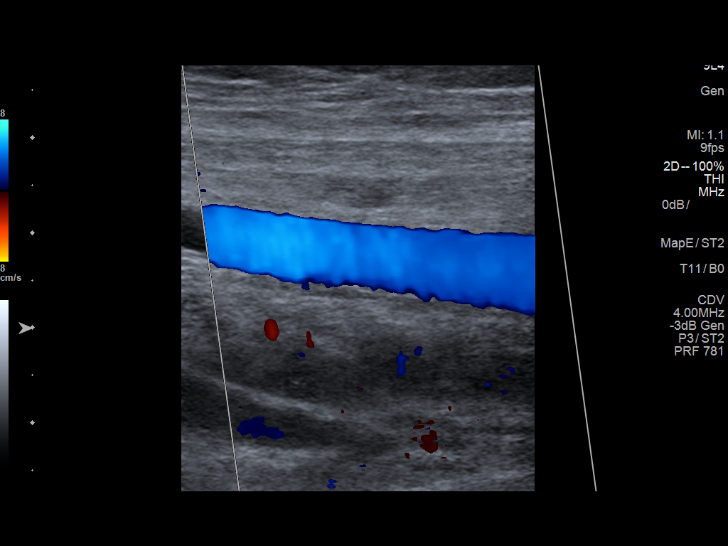
[im 16/28]
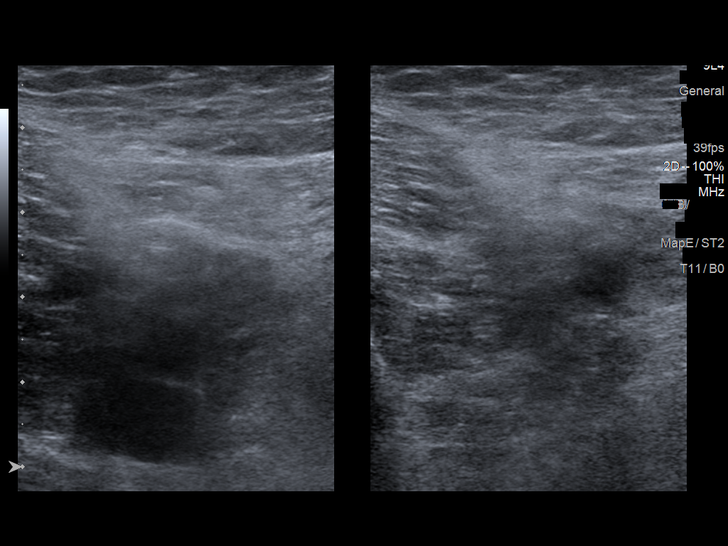
[im 17/28]
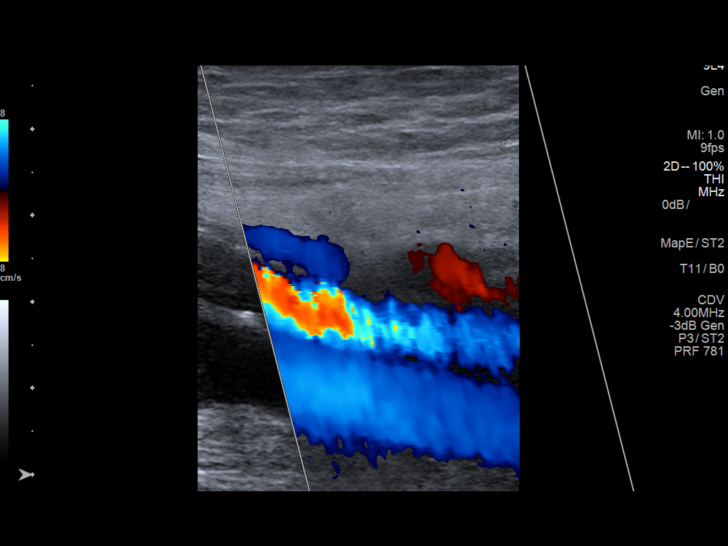
[im 19/28]
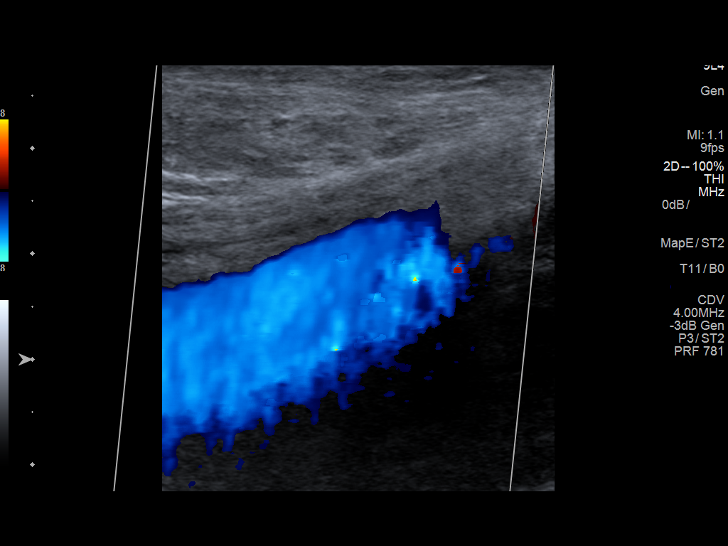
[im 22/28]
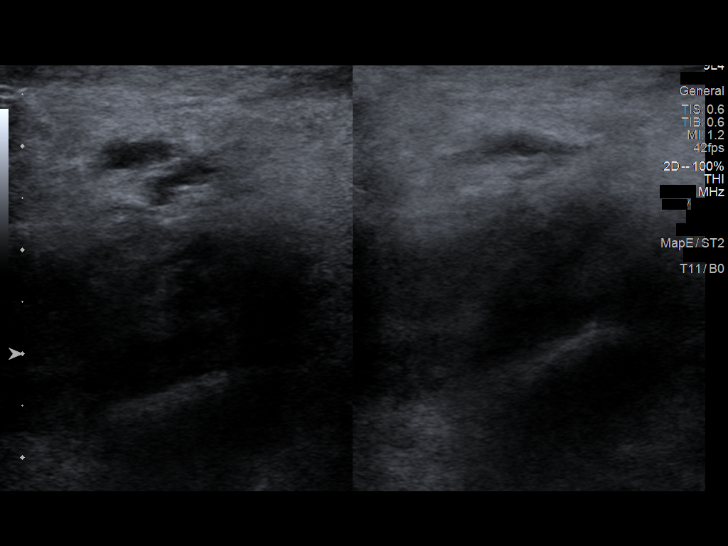
[im 24/28]
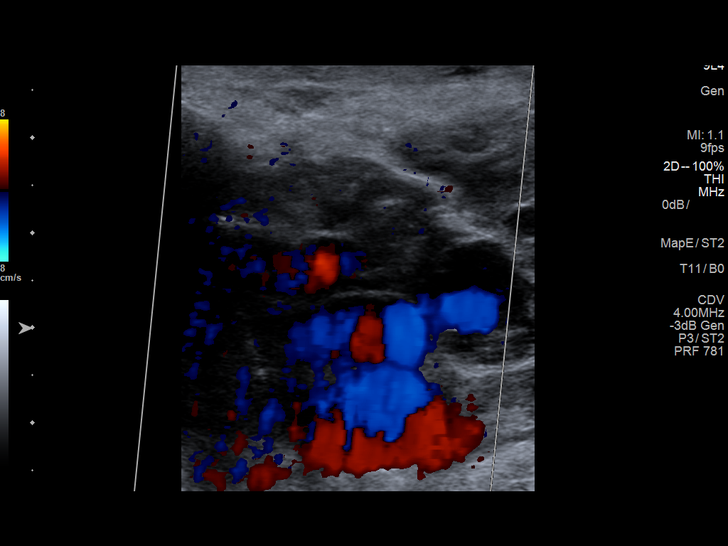
[im 25/28]
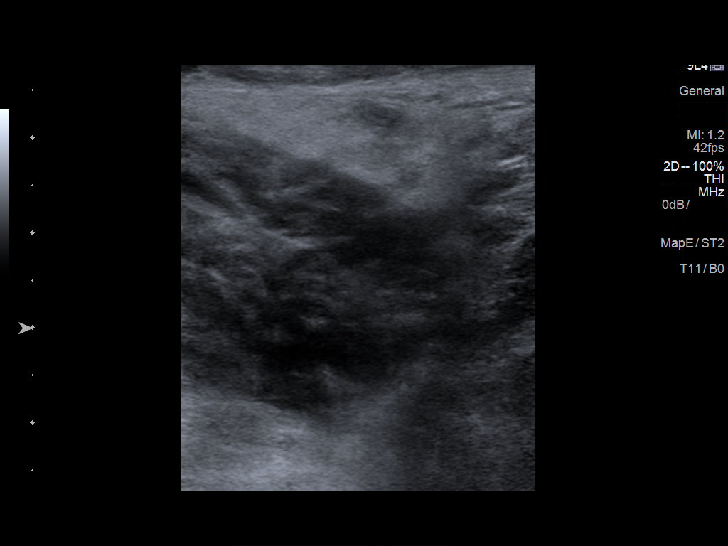
[im 28/28]
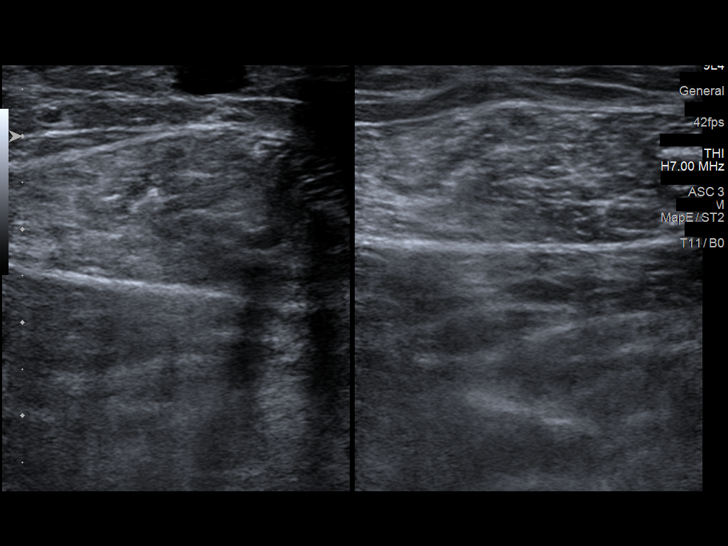

[13 of 24 positions shown; findings below may reference images not displayed]

FINDINGS: Contralateral Common Femoral Vein: Respiratory phasicity is normal
and symmetric with the symptomatic side. No evidence of thrombus.
Normal compressibility.

Common Femoral Vein: No evidence of thrombus. Normal
compressibility, respiratory phasicity and response to augmentation.

Saphenofemoral Junction: No evidence of thrombus. Normal
compressibility and flow on color Doppler imaging.

Profunda Femoral Vein: No evidence of thrombus. Normal
compressibility and flow on color Doppler imaging.

Femoral Vein: No evidence of thrombus. Normal compressibility,
respiratory phasicity and response to augmentation.

Popliteal Vein: No evidence of thrombus. Normal compressibility,
respiratory phasicity and response to augmentation.

Calf Veins: No evidence of thrombus. Normal compressibility and flow
on color Doppler imaging.

Superficial Great Saphenous Vein: No evidence of thrombus. Normal
compressibility and flow on color Doppler imaging.

Venous Reflux:  None.

Other Findings:  None.
IMPRESSION: No evidence of DVT within the right lower extremity.

## 2017-11-17 DIAGNOSIS — R3912 Poor urinary stream: Secondary | ICD-10-CM | POA: Diagnosis not present

## 2017-11-17 DIAGNOSIS — N401 Enlarged prostate with lower urinary tract symptoms: Secondary | ICD-10-CM | POA: Diagnosis not present

## 2017-11-17 DIAGNOSIS — R972 Elevated prostate specific antigen [PSA]: Secondary | ICD-10-CM | POA: Diagnosis not present

## 2017-11-19 IMAGING — XA DG MYELOGRAPHY LUMBAR INJ MULTI REGION
11 of 24 series · 11 of 24 positions shown · non-contrast
Comparison: none

CLINICAL DATA: Low back pain. RIGHT leg pain. Previous fusion for
thoracic compression fracture.
TECHNIQUE: Contiguous axial images were obtained through the Thoracic and
Lumbar spine after the intrathecal infusion of infusion. Coronal and
sagittal reconstructions were obtained of the axial image sets.

[Series 1: w lumbar spine ap · 0.15mm/px · 1 of 1 slices shown]
[im 1/1]
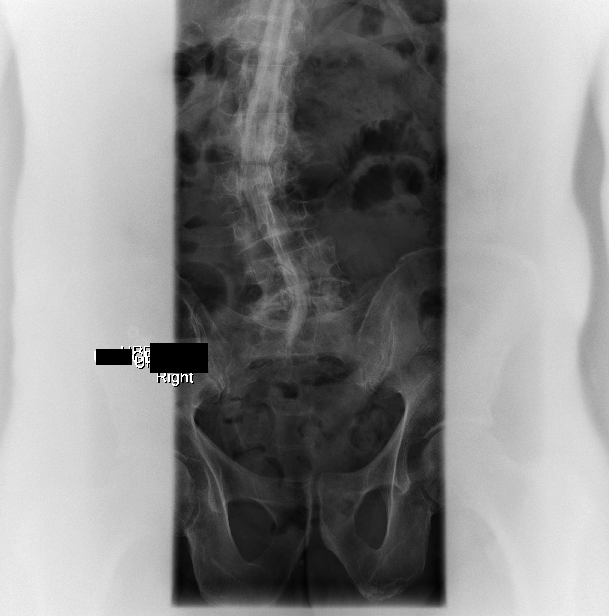

[Series 2: ortho standard · 1 of 1 slices shown (1 of 10)]
[im 1/1]
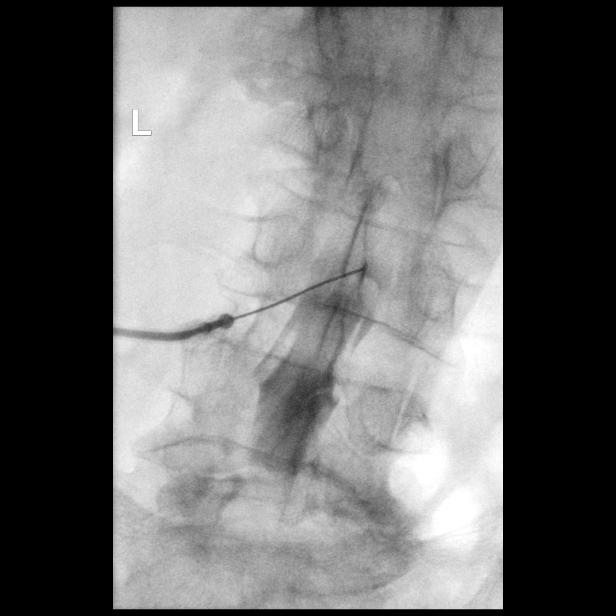

[Series 3: ortho standard · 1 of 1 slices shown (2 of 10)]
[im 1/1]
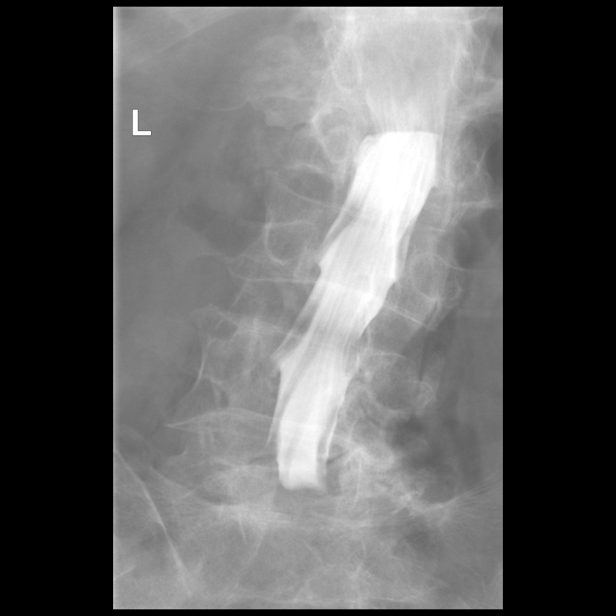

[Series 4: ortho standard · 1 of 1 slices shown (3 of 10)]
[im 1/1]
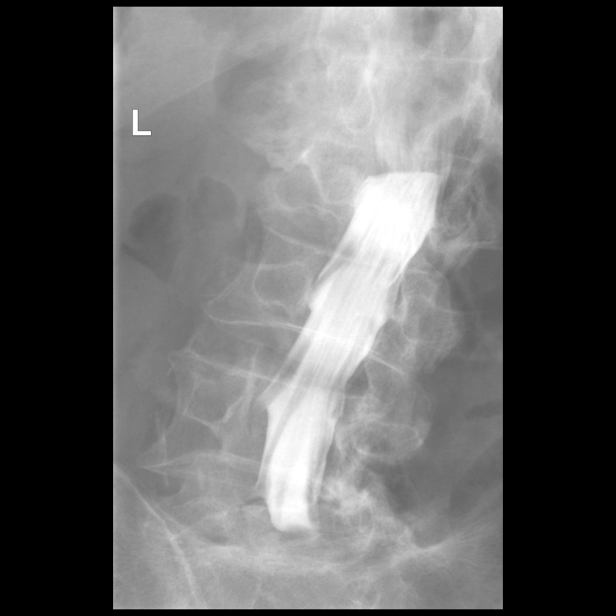

[Series 6: ortho standard · 1 of 1 slices shown (4 of 10)]
[im 1/1]
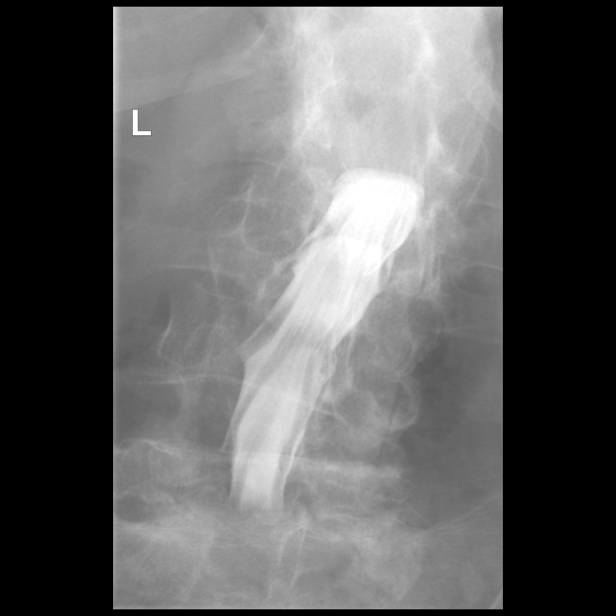

[Series 9: ortho standard · 1 of 1 slices shown (5 of 10)]
[im 1/1]
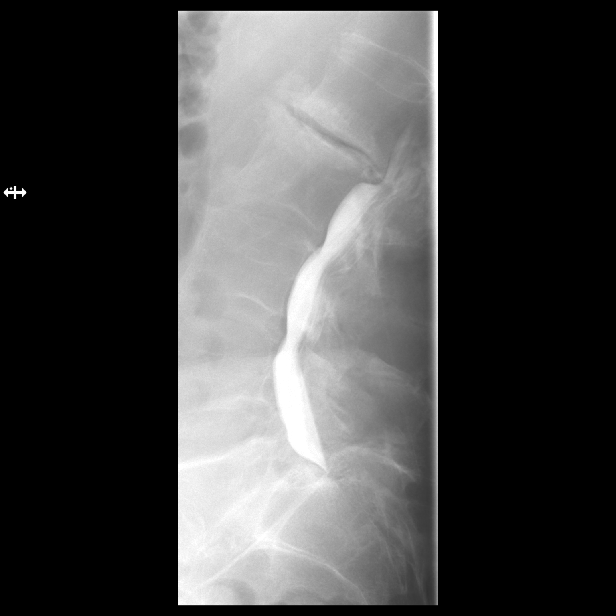

[Series 11: ortho standard · 1 of 1 slices shown (6 of 10)]
[im 1/1]
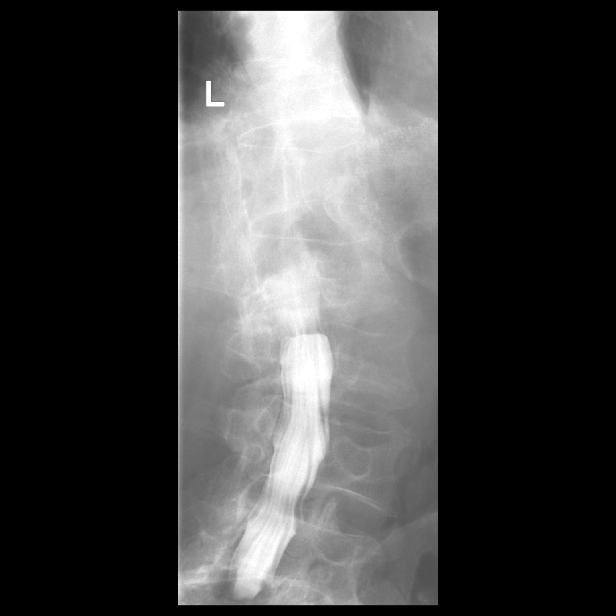

[Series 13: ortho standard · 1 of 1 slices shown (7 of 10)]
[im 1/1]
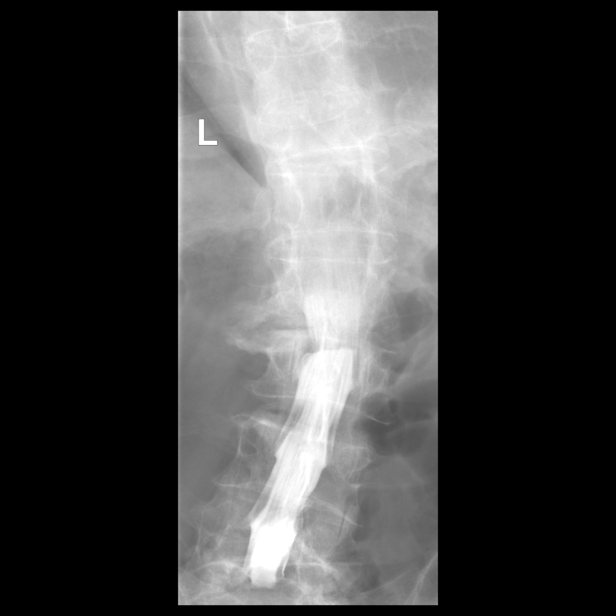

[Series 15: ortho standard · 1 of 1 slices shown (8 of 10)]
[im 1/1]
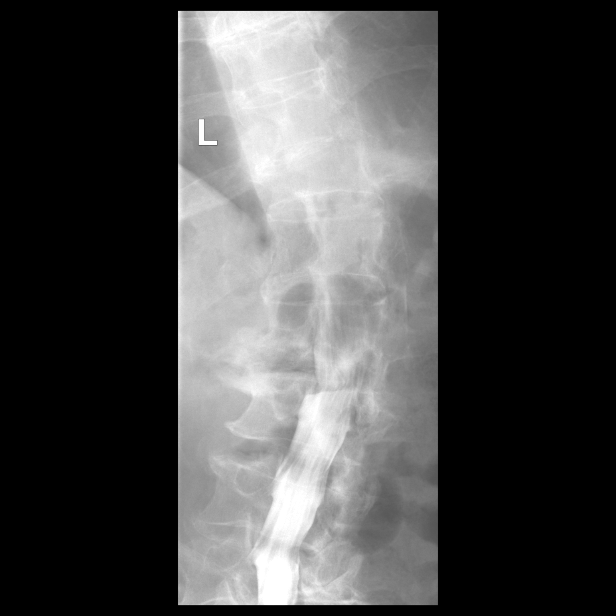

[Series 17: ortho standard · 1 of 1 slices shown (9 of 10)]
[im 1/1]
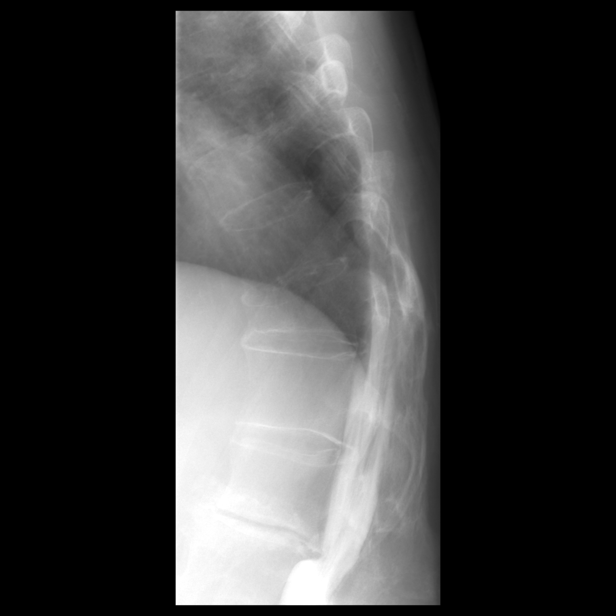

[Series 19: ortho standard · 1 of 1 slices shown (10 of 10)]
[im 1/1]
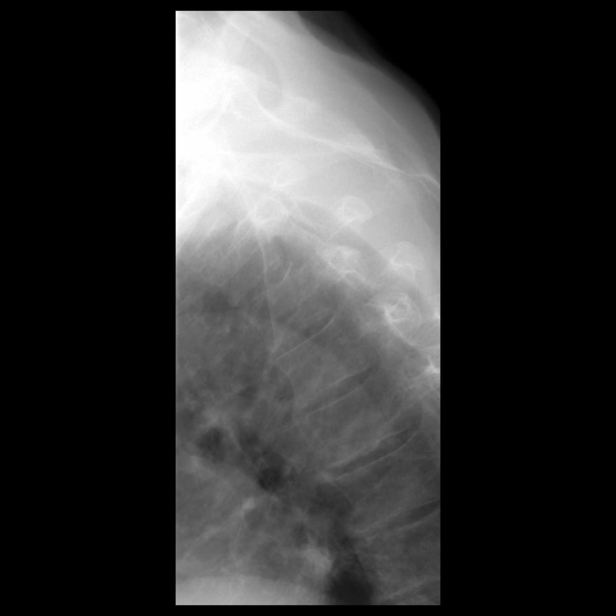

[11 of 24 positions shown; findings below may reference images not displayed]

FLUOROSCOPY TIME:  1 minutes and 21 seconds corresponding to a Dose
Area Product of 369.49 ?Gy*m2

PROCEDURE:
LUMBAR PUNCTURE FOR THORACIC AND LUMBAR MYELOGRAM

After thorough discussion of risks and benefits of the procedure
including bleeding, infection, injury to nerves, blood vessels,
adjacent structures as well as headache and CSF leak, written and
oral informed consent was obtained. Consent was obtained by Dr. Mcrae
Emmanuel.

Patient was positioned prone on the fluoroscopy table. Local
anesthesia was provided with 1% lidocaine without epinephrine after
prepped and draped in the usual sterile fashion. Puncture was
performed at L3 using a 3 1/2 inch 22-gauge spinal needle via
midline approach. Using a single pass through the dura, the needle
was placed within the thecal sac, with return of clear CSF. 10 mL of
Isovue-M 300 was injected into the thecal sac, with normal
opacification of the nerve roots and cauda equina consistent with
free flow within the subarachnoid space. The patient was then moved
to the trendelenburg position and contrast flowed into the Thoracic
spine region.

I personally performed the lumbar puncture and administered the
intrathecal contrast. I also personally supervised acquisition of
the myelogram images.
FINDINGS: THORACIC AND LUMBAR MYELOGRAM FINDINGS:

LUMBAR: Good opacification lumbar subarachnoid space. BILATERAL L5
spondylolysis allows 8 mm of anterolisthesis L5-S1. Near complete
loss of interspace height at L5-S1 is accompanied by functional
fusion across the interspace.

There is severe disc space narrowing L1-L2. Osseous spurring is
accompanied by 6 mm retrolisthesis. There is asymmetric loss of
interspace height to the LEFT with osseous spurring. LEFT greater
than RIGHT L2 nerve root impingement is noted. There is 30 degrees
of scoliosis convex RIGHT.

At L4-5, there is asymmetric loss of interspace height on the RIGHT.
The RIGHT L5 nerve root is effaced, partially truncated.

At L3-4 there is mild stenosis. There is effacement the RIGHT L4
nerve root.

At L2-3, there is asymmetric loss of interspace height on the LEFT
with effacement of the LEFT L3 nerve root.

Standing flexion extension radiographs were performed. In neutral,
there is 8 mm anterolisthesis L5-S1 and 6 mm retrolisthesis L1-2. 2
mm retrolisthesis L2-3 is observed. This is unchanged through
flexion and extension.

THORACIC: Contrast was maneuvered into the thoracic subarachnoid
space. There is a chronic compression fracture of T11 which patient
sustained as a young man. There is approximately 50% loss of
vertebral body height anteriorly. The patient has undergone
treatment for this with a laminectomy and subsequent T9 through L1
posterior fusion. This fusion is solid. There is no residual
impingement of the cord. No metallic densities are seen in the
spinal canal.

At T8-9, there is 2 mm of degenerative anterolisthesis. There is
straightening of the normal thoracic kyphosis above this, but no
subluxation or significant extradural defects. No cord compression
is seen.

CT THORACIC MYELOGRAM FINDINGS:

Segmentation: Normal.

Alignment: 2 mm anterolisthesis T8-9, kyphotic deformity at T11 from
old fracture.

Vertebrae: No worrisome osseous lesion.T9 through L1 posterior
arthrodesis is solid.

Conus medullaris and distal thoracic cord: Normal in size, and
location.

Paraspinal tissues: No evidence for hydronephrosis or paravertebral
mass.

Disc levels:

The individual disc spaces are examined as follows:

T1-2:  Normal.

T2-3:  Normal.

T3-4:  Normal.

T4-5:  Normal.

T5-6:  Normal.

T6-7:  Slight bulge.

T7-8:  Osseous spurring.  No impingement.

T8-9: 2 mm anterolisthesis is facet mediated. Slight stenosis
without cord flattening. BILATERAL foraminal narrowing due to bony
overgrowth and slip could affect the T8 nerve roots.

T9-10:  Solid arthrodesis.  No impingement.

T10-11: Minimal retropulsion of bone. Calcified disc. No
impingement.

T11-12: Slight dorsal tethering of the cord is postoperative in
nature. No cord compression.

T12-L1:  Unremarkable.

CT LUMBAR MYELOGRAM FINDINGS:

Segmentation: Normal.

Alignment: Scoliosis measures approximately 23 degrees with patient
recumbent for CT. There is 8 mm anterolisthesis L5, due to BILATERAL
L5 pars defects. 5 mm retrolisthesis L1-2. The retrolisthesis at
L2-3 is essentially normalized.

Vertebrae: No worrisome osseous lesion.

Conus medullaris: Normal in size and location. Dorsal tethering at
T11-12 which is described above.

Paraspinal tissues: No evidence for hydronephrosis or paravertebral
mass. Aortic atherosclerosis.

Disc levels:

L1-L2: 5 mm retrolisthesis. Annular bulge. Facet arthropathy. Marked
osseous spurring to the LEFT with vacuum phenomenon. Subarticular
zone and foraminal zone narrowing affects the LEFT L1 and LEFT L2
nerve roots.

L2-L3: Annular bulge. Asymmetric loss of interspace height on the
LEFT. Asymmetric foraminal zone narrowing on the LEFT affects the L2
nerve root.

L3-L4: Central and rightward protrusion. Posterior element
hypertrophy. Mild stenosis. RIGHT L4 nerve root impingement.

L4-L5: Shallow central protrusion. Posterior element hypertrophy is
worse on the RIGHT. Asymmetric loss of interspace height to the
RIGHT. RIGHT L5 and BILATERAL L4 nerve root impingement are likely.

L5-S1: 8 mm anterolisthesis. BILATERAL L5 spondylolysis. Near
complete loss of interspace height with functional fusion across the
interspace. There is no definite subarticular zone or foraminal zone
narrowing although mild stenosis is observed.
IMPRESSION: 8 mm anterolisthesis L5-S1 is related to BILATERAL L5 pars defects.
Functional fusion across the interspace. No impingement at this
level.

Asymmetric RIGHT-sided neural impingement is most evident at L4-5,
where there is subarticular zone and foraminal zone narrowing;
correlate clinically for symptomatic RIGHT L5 nerve root
impingement. Similar less severe RIGHT-side changes are present at
L3-4.

Marked asymmetric loss of interspace height at L1-2 eccentric to the
LEFT, with L1 and L2 nerve root impingement.

Chronic compression deformity of T11, mildly kyphotic, with solid
posterior arthrodesis T9 through L1. Mild tethering of the cord
dorsally at this level is postoperative in nature. No significant
thoracic cord compression.

Adjacent segment disease at T8-9, with 2 mm anterolisthesis. No
dynamic instability, but BILATERAL foraminal narrowing related to
slip and bony overgrowth is observed.

No significant dynamic instability resulting in stenosis or
RIGHT-sided neural impingement.

## 2017-11-27 LAB — PSA: PSA: 7.99

## 2018-01-13 ENCOUNTER — Encounter: Payer: Self-pay | Admitting: Family Medicine

## 2018-01-13 ENCOUNTER — Ambulatory Visit (INDEPENDENT_AMBULATORY_CARE_PROVIDER_SITE_OTHER): Payer: Medicare HMO | Admitting: Family Medicine

## 2018-01-13 VITALS — BP 160/78 | HR 73 | Temp 98.3°F | Resp 18 | Wt 151.6 lb

## 2018-01-13 DIAGNOSIS — E785 Hyperlipidemia, unspecified: Secondary | ICD-10-CM | POA: Diagnosis not present

## 2018-01-13 DIAGNOSIS — R739 Hyperglycemia, unspecified: Secondary | ICD-10-CM

## 2018-01-13 DIAGNOSIS — C44310 Basal cell carcinoma of skin of unspecified parts of face: Secondary | ICD-10-CM

## 2018-01-13 DIAGNOSIS — R634 Abnormal weight loss: Secondary | ICD-10-CM | POA: Diagnosis not present

## 2018-01-13 DIAGNOSIS — R972 Elevated prostate specific antigen [PSA]: Secondary | ICD-10-CM | POA: Diagnosis not present

## 2018-01-13 DIAGNOSIS — I10 Essential (primary) hypertension: Secondary | ICD-10-CM | POA: Diagnosis not present

## 2018-01-13 NOTE — Progress Notes (Signed)
Subjective:  I acted as a Education administrator for Dr. Charlett Blake. [place], [place]  Patient ID: Troy Mccann, male    DOB: 1941/09/02, 77 y.o.   MRN: [id number]  No chief complaint on file.   HPI  Patient is in today for 6 month follow up and overall he is doing well. No recent febrile illness or hospitalizations. He is following with [facility] for a rising PSA and he is following with [facility] for his skin cancers. Denies CP/palp/SOB/HA/congestion/fevers/GI or GU c/o. Taking meds as prescribed  Patient Care Team: Mosie Lukes, MD as PCP - General (Family Medicine) Renette Butters, MD as Attending Physician (Orthopedic Surgery) Laroy Apple, MD as Referring Physician (Physical Medicine and Rehabilitation)   Past Medical History:  Diagnosis Date  . Abnormal PSA 01/12/2017  . BCC (basal cell carcinoma), face 01/12/2017  . BPH (benign prostatic hyperplasia) 04/15/2014  . Cataract 07/09/2015  . Hematuria 07/09/2015  . Medicare annual wellness visit, subsequent 10/21/2014  . Other and unspecified hyperlipidemia 04/15/2014  . SCC (squamous cell carcinoma) 10/21/2014  . Tick bite 2007   Never assessed by a physician.  Marland Kitchen Unspecified constipation 03/08/2013    History reviewed. No pertinent surgical history.  Family History  Problem Relation Age of Onset  . Arthritis Mother        rheumatoid  . Heart disease Mother        rheumatic fever  . Hypertension Mother   . Heart disease Father        MI  . Dementia Father   . Hypertension Sister   . Hypertension Son   . Obesity Son   . Aneurysm Maternal Grandmother   . Dementia Maternal Grandfather   . Heart disease Paternal Grandmother        MI  . Kidney disease Paternal Grandfather        bright's disease    Social History   Socioeconomic History  . Marital status: Widowed    Spouse name: Not on file  . Number of children: Not on file  . Years of education: Not on file  . Highest education level: Not on file    Social Needs  . Financial resource strain: Not on file  . Food insecurity - worry: Not on file  . Food insecurity - inability: Not on file  . Transportation needs - medical: Not on file  . Transportation needs - non-medical: Not on file  Occupational History  . Not on file  Tobacco Use  . Smoking status: Former Research scientist (life sciences)  . Smokeless tobacco: Never Used  Substance and Sexual Activity  . Alcohol use: Yes    Comment: rare  . Drug use: No  . Sexual activity: Not on file  Other Topics Concern  . Not on file  Social History Narrative  . Not on file    Outpatient Medications Prior to Visit  Medication Sig Dispense Refill  . Ascorbic Acid (VITAMIN C) POWD Take 333 mg by mouth daily. Emergen-C-take 1/3 packet daily.    . B Complex-C (SUPER B COMPLEX PO) Take by mouth daily.    . Camphor-Menthol-Methyl Sal (TIGER BALM MUSCLE RUB EX) Apply topically at bedtime.    . Cholecalciferol (VITAMIN D-3) 1000 UNITS CAPS Take by mouth daily.    Marland Kitchen ibuprofen (ADVIL,MOTRIN) 200 MG tablet Take 200 mg by mouth every 6 (six) hours as needed.    . gabapentin (NEURONTIN) 300 MG capsule Take 350 mg by mouth 3 (three) times daily.    Marland Kitchen  methylPREDNISolone (MEDROL DOSEPAK) 4 MG TBPK tablet Take by mouth.     No facility-administered medications prior to visit.     Allergies  Allergen Reactions  . Yellow Jacket Venom [Bee Venom] Hives    Review of Systems  Constitutional: Negative for fever and malaise/fatigue.  HENT: Negative for congestion.   Eyes: Negative for blurred vision.  Respiratory: Negative for shortness of breath.   Cardiovascular: Negative for chest pain, palpitations and leg swelling.  Gastrointestinal: Negative for abdominal pain, blood in stool and nausea.  Genitourinary: Negative for dysuria and frequency.  Musculoskeletal: Negative for falls.  Skin: Negative for rash.  Neurological: Negative for dizziness, loss of consciousness and headaches.  Endo/Heme/Allergies: Negative for  environmental allergies.  Psychiatric/Behavioral: Negative for depression. The patient is not nervous/anxious.        Objective:    Physical Exam  Constitutional: He is oriented to person, place, and time. He appears well-developed and well-nourished. No distress.  HENT:  Head: Normocephalic and atraumatic.  Nose: Nose normal.  Eyes: Right eye exhibits no discharge. Left eye exhibits no discharge.  Neck: Normal range of motion. Neck supple.  Cardiovascular: Normal rate and regular rhythm.  No murmur heard. Pulmonary/Chest: Effort normal and breath sounds normal.  Abdominal: Soft. Bowel sounds are normal. There is no tenderness.  Musculoskeletal: He exhibits no edema.  Neurological: He is alert and oriented to person, place, and time.  Skin: Skin is warm and dry.  Psychiatric: He has a normal mood and affect.  Nursing note and vitals reviewed.   BP (!) 160/78   Pulse 73   Temp 98.3 F (36.8 C) (Oral)   Resp 18   Wt 151 lb 9.6 oz (68.8 kg)   SpO2 95%   BMI 24.47 kg/m  Wt Readings from Last 3 Encounters:  01/13/18 151 lb 9.6 oz (68.8 kg)  07/13/17 145 lb 6.4 oz (66 kg)  01/12/17 150 lb 3.2 oz (68.1 kg)   BP Readings from Last 3 Encounters:  01/13/18 (!) 160/78  07/26/17 (!) 143/68  07/13/17 138/68     Immunization History  Administered Date(s) Administered  . Pneumococcal Conjugate-13 10/15/2014  . Pneumococcal Polysaccharide-23 09/05/2012  . Tdap 09/05/2012    Health Maintenance  Topic Date Due  . INFLUENZA VACCINE  07/07/2017  . TETANUS/TDAP  09/05/2022  . PNA vac Low Risk Adult  Completed    Lab Results  Component Value Date   WBC 7.0 07/06/2017   HGB 14.3 07/06/2017   HCT 42.4 07/06/2017   PLT 211.0 07/06/2017   GLUCOSE 101 (H) 07/06/2017   CHOL 174 07/06/2017   TRIG 73.0 07/06/2017   HDL 42.80 07/06/2017   LDLCALC 117 (H) 07/06/2017   ALT 19 07/06/2017   AST 25 07/06/2017   NA 136 07/06/2017   K 3.7 07/06/2017   CL 103 07/06/2017    CREATININE 0.75 07/06/2017   BUN 18 07/06/2017   CO2 26 07/06/2017   TSH 2.25 07/06/2017   PSA 4.62 (H) 10/15/2014    Lab Results  Component Value Date   TSH 2.25 07/06/2017   Lab Results  Component Value Date   WBC 7.0 07/06/2017   HGB 14.3 07/06/2017   HCT 42.4 07/06/2017   MCV 93.9 07/06/2017   PLT 211.0 07/06/2017   Lab Results  Component Value Date   NA 136 07/06/2017   K 3.7 07/06/2017   CO2 26 07/06/2017   GLUCOSE 101 (H) 07/06/2017   BUN 18 07/06/2017   CREATININE 0.75 07/06/2017  BILITOT 1.0 07/06/2017   ALKPHOS 48 07/06/2017   AST 25 07/06/2017   ALT 19 07/06/2017   PROT 7.1 07/06/2017   ALBUMIN 4.4 07/06/2017   CALCIUM 9.4 07/06/2017   GFR 106.92 07/06/2017   Lab Results  Component Value Date   CHOL 174 07/06/2017   Lab Results  Component Value Date   HDL 42.80 07/06/2017   Lab Results  Component Value Date   LDLCALC 117 (H) 07/06/2017   Lab Results  Component Value Date   TRIG 73.0 07/06/2017   Lab Results  Component Value Date   CHOLHDL 4 07/06/2017   No results found for: HGBA1C       Assessment & Plan:   Problem List Items Addressed This Visit    Weight loss    Good weight gain with increased po intake      HTN (hypertension)    Mild elevation on arrival       Relevant Orders   CBC   Comprehensive metabolic panel   TSH   Hyperlipidemia    Encouraged heart healthy diet, increase exercise, avoid trans fats, consider a krill oil cap daily      Relevant Orders   Lipid panel   BCC (basal cell carcinoma), face    Follows [facility] [place] [facility]      Abnormal PSA    He is following with [facility] now and his PSA increased to 7.99 so they are managing.        Other Visit Diagnoses    Hyperglycemia    -  Primary   Relevant Orders   Hemoglobin A1c      I have discontinued Jamael T. Mahajan's methylPREDNISolone and gabapentin. I am also having him maintain his Vitamin D-3, Camphor-Menthol-Methyl Sal  (TIGER BALM MUSCLE RUB EX), Vitamin C, B Complex-C (SUPER B COMPLEX PO), and ibuprofen.  No orders of the defined types were placed in this encounter.   CMA served as Education administrator during this visit. History, Physical and Plan performed by medical provider. Documentation and orders reviewed and attested to.  Penni Homans, MD

## 2018-01-13 NOTE — Patient Instructions (Addendum)
Dr McDiarmid and Dr Alinda Money urology at [facility] is a hybride of DASH and Mediterranean  Shingrix is the new shingles, 2 shots over 2-6 months. At the pharmacy Carbohydrate Counting for Diabetes Mellitus, Adult Carbohydrate counting is a method for keeping track of how many carbohydrates you eat. Eating carbohydrates naturally increases the amount of sugar (glucose) in the blood. Counting how many carbohydrates you eat helps keep your blood glucose within normal limits, which helps you manage your diabetes (diabetes mellitus). It is important to know how many carbohydrates you can safely have in each meal. This is different for every person. A diet and nutrition specialist (registered dietitian) can help you make a meal plan and calculate how many carbohydrates you should have at each meal and snack. Carbohydrates are found in the following foods:  Grains, such as breads and cereals.  Dried beans and soy products.  Starchy vegetables, such as potatoes, peas, and corn.  Fruit and fruit juices.  Milk and yogurt.  Sweets and snack foods, such as cake, cookies, candy, chips, and soft drinks.  How do I count carbohydrates? There are two ways to count carbohydrates in food. You can use either of the methods or a combination of both. Reading "Nutrition Facts" on packaged food The "Nutrition Facts" list is included on the labels of almost all packaged foods and beverages in the [place] It includes:  The serving size.  Information about nutrients in each serving, including the grams (g) of carbohydrate per serving.  To use the "Nutrition Facts":  Decide how many servings you will have.  Multiply the number of servings by the number of carbohydrates per serving.  The resulting number is the total amount of carbohydrates that you will be having.  Learning standard serving sizes of other foods When you eat foods containing carbohydrates that are not packaged or do not include  "Nutrition Facts" on the label, you need to measure the servings in order to count the amount of carbohydrates:  Measure the foods that you will eat with a food scale or measuring cup, if needed.  Decide how many standard-size servings you will eat.  Multiply the number of servings by 15. Most carbohydrate-rich foods have about 15 g of carbohydrates per serving. ? For example, if you eat 8 oz (170 g) of strawberries, you will have eaten 2 servings and 30 g of carbohydrates (2 servings x 15 g = 30 g).  For foods that have more than one food mixed, such as soups and casseroles, you must count the carbohydrates in each food that is included.  The following list contains standard serving sizes of common carbohydrate-rich foods. Each of these servings has about 15 g of carbohydrates:   hamburger bun or  English muffin.   oz (15 mL) syrup.   oz (14 g) jelly.  1 slice of bread.  1 six-inch tortilla.  3 oz (85 g) cooked rice or pasta.  4 oz (113 g) cooked dried beans.  4 oz (113 g) starchy vegetable, such as peas, corn, or potatoes.  4 oz (113 g) hot cereal.  4 oz (113 g) mashed potatoes or  of a large baked potato.  4 oz (113 g) canned or frozen fruit.  4 oz (120 mL) fruit juice.  4-6 crackers.  6 chicken nuggets.  6 oz (170 g) unsweetened dry cereal.  6 oz (170 g) plain fat-free yogurt or yogurt sweetened with artificial sweeteners.  8 oz (240 mL) milk.  8  oz (170 g) fresh fruit or one small piece of fruit.  24 oz (680 g) popped popcorn.  Example of carbohydrate counting Sample meal  3 oz (85 g) chicken breast.  6 oz (170 g) brown rice.  4 oz (113 g) corn.  8 oz (240 mL) milk.  8 oz (170 g) strawberries with sugar-free whipped topping. Carbohydrate calculation 1. Identify the foods that contain carbohydrates: ? Rice. ? Corn. ? Milk. ? Strawberries. 2. Calculate how many servings you have of each food: ? 2 servings rice. ? 1 serving corn. ? 1  serving milk. ? 1 serving strawberries. 3. Multiply each number of servings by 15 g: ? 2 servings rice x 15 g = 30 g. ? 1 serving corn x 15 g = 15 g. ? 1 serving milk x 15 g = 15 g. ? 1 serving strawberries x 15 g = 15 g. 4. Add together all of the amounts to find the total grams of carbohydrates eaten: ? 30 g + 15 g + 15 g + 15 g = 75 g of carbohydrates total. This information is not intended to replace advice given to you by your health care provider. Make sure you discuss any questions you have with your health care provider. Document Released: 11/23/2005 Document Revised: 06/12/2016 Document Reviewed: 05/06/2016 Elsevier Interactive Patient Education  [employer].

## 2018-01-13 NOTE — Assessment & Plan Note (Signed)
Encouraged heart healthy diet, increase exercise, avoid trans fats, consider a krill oil cap daily 

## 2018-01-13 NOTE — Assessment & Plan Note (Signed)
Good weight gain with increased po intake

## 2018-01-13 NOTE — Assessment & Plan Note (Signed)
Mild elevation on arrival

## 2018-01-16 NOTE — Assessment & Plan Note (Signed)
He is following with [facility] now and his PSA increased to 7.99 so they are managing.

## 2018-01-16 NOTE — Assessment & Plan Note (Signed)
Follows with [facility] [place] [facility]

## 2018-02-24 DIAGNOSIS — D044 Carcinoma in situ of skin of scalp and neck: Secondary | ICD-10-CM | POA: Diagnosis not present

## 2018-02-24 DIAGNOSIS — Z08 Encounter for follow-up examination after completed treatment for malignant neoplasm: Secondary | ICD-10-CM | POA: Diagnosis not present

## 2018-02-24 DIAGNOSIS — Z85828 Personal history of other malignant neoplasm of skin: Secondary | ICD-10-CM | POA: Diagnosis not present

## 2018-02-24 DIAGNOSIS — C44729 Squamous cell carcinoma of skin of left lower limb, including hip: Secondary | ICD-10-CM | POA: Diagnosis not present

## 2018-02-24 DIAGNOSIS — L570 Actinic keratosis: Secondary | ICD-10-CM | POA: Diagnosis not present

## 2018-02-24 DIAGNOSIS — D485 Neoplasm of uncertain behavior of skin: Secondary | ICD-10-CM | POA: Diagnosis not present

## 2018-03-03 ENCOUNTER — Other Ambulatory Visit (INDEPENDENT_AMBULATORY_CARE_PROVIDER_SITE_OTHER): Payer: Medicare HMO

## 2018-03-03 ENCOUNTER — Ambulatory Visit (INDEPENDENT_AMBULATORY_CARE_PROVIDER_SITE_OTHER): Payer: Medicare HMO

## 2018-03-03 VITALS — BP 158/86 | HR 66 | Resp 18

## 2018-03-03 DIAGNOSIS — R739 Hyperglycemia, unspecified: Secondary | ICD-10-CM | POA: Diagnosis not present

## 2018-03-03 DIAGNOSIS — I10 Essential (primary) hypertension: Secondary | ICD-10-CM

## 2018-03-03 LAB — COMPREHENSIVE METABOLIC PANEL
ALT: 26 U/L (ref 0–53)
AST: 31 U/L (ref 0–37)
Albumin: 4 g/dL (ref 3.5–5.2)
Alkaline Phosphatase: 48 U/L (ref 39–117)
BILIRUBIN TOTAL: 1.1 mg/dL (ref 0.2–1.2)
BUN: 19 mg/dL (ref 6–23)
CHLORIDE: 104 meq/L (ref 96–112)
CO2: 30 meq/L (ref 19–32)
Calcium: 9.2 mg/dL (ref 8.4–10.5)
Creatinine, Ser: 0.69 mg/dL (ref 0.40–1.50)
GFR: 117.53 mL/min (ref >60.00)
GLUCOSE: 92 mg/dL (ref 70–99)
POTASSIUM: 4 meq/L (ref 3.5–5.1)
Sodium: 139 mEq/L (ref 135–145)
Total Protein: 6.8 g/dL (ref 6.0–8.3)

## 2018-03-03 LAB — HEMOGLOBIN A1C: HEMOGLOBIN A1C: 5.4 % (ref 4.6–6.5)

## 2018-03-03 LAB — LIPID PANEL
CHOLESTEROL: 157 mg/dL (ref 0–200)
HDL: 45.3 mg/dL (ref >39.00)
LDL Cholesterol: 98 mg/dL (ref 0–99)
NONHDL: 111.22
TRIGLYCERIDES: 67 mg/dL (ref 0.0–149.0)
Total CHOL/HDL Ratio: 3
VLDL: 13.4 mg/dL (ref 0.0–40.0)

## 2018-03-03 LAB — CBC
HEMATOCRIT: 40.3 % (ref 39.0–52.0)
HEMOGLOBIN: 13.6 g/dL (ref 13.0–17.0)
MCHC: 33.9 g/dL (ref 30.0–36.0)
MCV: 92.1 fl (ref 78.0–100.0)
PLATELETS: 202 10*3/uL (ref 150.0–400.0)
RBC: 4.37 Mil/uL (ref 4.22–5.81)
RDW: 13.4 % (ref 11.5–15.5)
WBC: 5.4 10*3/uL (ref 4.0–10.5)

## 2018-03-03 LAB — TSH: TSH: 1.93 u[IU]/mL (ref 0.35–4.50)

## 2018-03-03 NOTE — Progress Notes (Signed)
Blood pressure check. Last pressure 01/13/18:  160/78  Pulse: 73   Today: BP 158/86 Pulse 66 O2: 99%   Patient currently takes no medication for blood pressure. Today he presents asymptomatic. Patient takes his bllod pressure at home the only reading he gave me was 124/76.  Per Percell Miller he is to check his blood pressure over the weekend and reports to office on next Tuesday and bring his bp machine.   Orders given to patient he stated he wants to come back in 1 week. I gave him blood pressure tracker  And track his blood pressure for the next week, and advise him to bring in his machine. He voiced his understanding  He is scheduled for next Thursday 03/10/18 at 10:30am

## 2018-03-10 ENCOUNTER — Ambulatory Visit (INDEPENDENT_AMBULATORY_CARE_PROVIDER_SITE_OTHER): Payer: Medicare HMO | Admitting: Family Medicine

## 2018-03-10 DIAGNOSIS — I10 Essential (primary) hypertension: Secondary | ICD-10-CM | POA: Diagnosis not present

## 2018-03-10 NOTE — Progress Notes (Signed)
Pre visit review using our clinic review tool, if applicable. No additional management support is needed unless otherwise documented below in the visit note.  Pt here for blood pressure check per Dr Charlett Blake. Also, see nurse visit from 03/03/18.  Pt was advised to check BP readings at home over the last week and return today for BP check.  BP last visit = 158/86 HR = 66  Pt not currently on any blood pressure medication.  Pt brings home readings that are primarily in range (120s/70s - 135/78).  Highest readings were: 140/73, 146/78 and 147/77 since last visit on 03/03/18.  BP today @ 10:49am =  164/92 Manual 164/82 HR = 68  Repeat manual BP @ 11:03am = 150/80 Patient's machine = 146/83  Pt does walk on the treadmill and was previously given a copy of DASH diet. States he doesn't follow it completely but feels his diet is good.   Advised pt per verbal from Doc of Day Carollee Herter) to continue DASH diet check blood pressure daily and call if any consistently elevated readings (140/90 or above) and to follow up with PCP in 1 month. Appointment scheduled for 04/12/18 at 3:15pm.  Ann Held, DO

## 2018-04-12 ENCOUNTER — Ambulatory Visit (INDEPENDENT_AMBULATORY_CARE_PROVIDER_SITE_OTHER): Payer: Medicare HMO | Admitting: Family Medicine

## 2018-04-12 ENCOUNTER — Encounter: Payer: Self-pay | Admitting: Family Medicine

## 2018-04-12 DIAGNOSIS — I10 Essential (primary) hypertension: Secondary | ICD-10-CM

## 2018-04-12 DIAGNOSIS — R739 Hyperglycemia, unspecified: Secondary | ICD-10-CM

## 2018-04-12 NOTE — Patient Instructions (Addendum)
[employer]   MIND diet Hypertension Hypertension, commonly called high blood pressure, is when the force of blood pumping through the arteries is too strong. The arteries are the blood vessels that carry blood from the heart throughout the body. Hypertension forces the heart to work harder to pump blood and may cause arteries to become narrow or stiff. Having untreated or uncontrolled hypertension can cause heart attacks, strokes, kidney disease, and other problems. A blood pressure reading consists of a higher number over a lower number. Ideally, your blood pressure should be below 120/80. The first ("top") number is called the systolic pressure. It is a measure of the pressure in your arteries as your heart beats. The second ("bottom") number is called the diastolic pressure. It is a measure of the pressure in your arteries as the heart relaxes. What are the causes? The cause of this condition is not known. What increases the risk? Some risk factors for high blood pressure are under your control. Others are not. Factors you can change  Smoking.  Having type 2 diabetes mellitus, high cholesterol, or both.  Not getting enough exercise or physical activity.  Being overweight.  Having too much fat, sugar, calories, or salt (sodium) in your diet.  Drinking too much alcohol. Factors that are difficult or impossible to change  Having chronic kidney disease.  Having a family history of high blood pressure.  Age. Risk increases with age.  Race. You may be at higher risk if you are African-American.  Gender. Men are at higher risk than women before age 48. After age 39, women are at higher risk than men.  Having obstructive sleep apnea.  Stress. What are the signs or symptoms? Extremely high blood pressure (hypertensive crisis) may cause:  Headache.  Anxiety.  Shortness of breath.  Nosebleed.  Nausea and vomiting.  Severe chest pain.  Jerky movements you cannot  control (seizures).  How is this diagnosed? This condition is diagnosed by measuring your blood pressure while you are seated, with your arm resting on a surface. The cuff of the blood pressure monitor will be placed directly against the skin of your upper arm at the level of your heart. It should be measured at least twice using the same arm. Certain conditions can cause a difference in blood pressure between your right and left arms. Certain factors can cause blood pressure readings to be lower or higher than normal (elevated) for a short period of time:  When your blood pressure is higher when you are in a health care provider's office than when you are at home, this is called white coat hypertension. Most people with this condition do not need medicines.  When your blood pressure is higher at home than when you are in a health care provider's office, this is called masked hypertension. Most people with this condition may need medicines to control blood pressure.  If you have a high blood pressure reading during one visit or you have normal blood pressure with other risk factors:  You may be asked to return on a different day to have your blood pressure checked again.  You may be asked to monitor your blood pressure at home for 1 week or longer.  If you are diagnosed with hypertension, you may have other blood or imaging tests to help your health care provider understand your overall risk for other conditions. How is this treated? This condition is treated by making healthy lifestyle changes, such as eating healthy foods, exercising  more, and reducing your alcohol intake. Your health care provider may prescribe medicine if lifestyle changes are not enough to get your blood pressure under control, and if:  Your systolic blood pressure is above 130.  Your diastolic blood pressure is above 80.  Your personal target blood pressure may vary depending on your medical conditions, your age, and  other factors. Follow these instructions at home: Eating and drinking  Eat a diet that is high in fiber and potassium, and low in sodium, added sugar, and fat. An example eating plan is called the DASH (Dietary Approaches to Stop Hypertension) diet. To eat this way: ? Eat plenty of fresh fruits and vegetables. Try to fill half of your plate at each meal with fruits and vegetables. ? Eat whole grains, such as whole wheat pasta, brown rice, or whole grain bread. Fill about one quarter of your plate with whole grains. ? Eat or drink low-fat dairy products, such as skim milk or low-fat yogurt. ? Avoid fatty cuts of meat, processed or cured meats, and poultry with skin. Fill about one quarter of your plate with lean proteins, such as fish, chicken without skin, beans, eggs, and tofu. ? Avoid premade and processed foods. These tend to be higher in sodium, added sugar, and fat.  Reduce your daily sodium intake. Most people with hypertension should eat less than 1,500 mg of sodium a day.  Limit alcohol intake to no more than 1 drink a day for nonpregnant women and 2 drinks a day for men. One drink equals 12 oz of beer, 5 oz of wine, or 1 oz of hard liquor. Lifestyle  Work with your health care provider to maintain a healthy body weight or to lose weight. Ask what an ideal weight is for you.  Get at least 30 minutes of exercise that causes your heart to beat faster (aerobic exercise) most days of the week. Activities may include walking, swimming, or biking.  Include exercise to strengthen your muscles (resistance exercise), such as pilates or lifting weights, as part of your weekly exercise routine. Try to do these types of exercises for 30 minutes at least 3 days a week.  Do not use any products that contain nicotine or tobacco, such as cigarettes and e-cigarettes. If you need help quitting, ask your health care provider.  Monitor your blood pressure at home as told by your health care  provider.  Keep all follow-up visits as told by your health care provider. This is important. Medicines  Take over-the-counter and prescription medicines only as told by your health care provider. Follow directions carefully. Blood pressure medicines must be taken as prescribed.  Do not skip doses of blood pressure medicine. Doing this puts you at risk for problems and can make the medicine less effective.  Ask your health care provider about side effects or reactions to medicines that you should watch for. Contact a health care provider if:  You think you are having a reaction to a medicine you are taking.  You have headaches that keep coming back (recurring).  You feel dizzy.  You have swelling in your ankles.  You have trouble with your vision. Get help right away if:  You develop a severe headache or confusion.  You have unusual weakness or numbness.  You feel faint.  You have severe pain in your chest or abdomen.  You vomit repeatedly.  You have trouble breathing. Summary  Hypertension is when the force of blood pumping through your arteries is  too strong. If this condition is not controlled, it may put you at risk for serious complications.  Your personal target blood pressure may vary depending on your medical conditions, your age, and other factors. For most people, a normal blood pressure is less than 120/80.  Hypertension is treated with lifestyle changes, medicines, or a combination of both. Lifestyle changes include weight loss, eating a healthy, low-sodium diet, exercising more, and limiting alcohol. This information is not intended to replace advice given to you by your health care provider. Make sure you discuss any questions you have with your health care provider. Document Released: 11/23/2005 Document Revised: 10/21/2016 Document Reviewed: 10/21/2016 Elsevier Interactive Patient Education  [employer].

## 2018-04-12 NOTE — Progress Notes (Signed)
Subjective:  I acted as a Education administrator for Dr. Charlett Blake. Troy Mccann, [place]  Patient ID: SAID RUEB, male    DOB: Feb 04, 1941, 77 y.o.   MRN: [id number]  No chief complaint on file.   HPI  Patient is in today for a blood pressure follow up and he feels well today. No recent febrile illness or hospitalization. No polyuria or polydipsia is noted. Denies CP/palp/SOB/HA/congestion/fevers/GI or GU c/o. Taking meds as prescribed  Patient Care Team: Mosie Lukes, MD as PCP - General (Family Medicine) Renette Butters, MD as Attending Physician (Orthopedic Surgery) Laroy Apple, MD as Referring Physician (Physical Medicine and Rehabilitation)   Past Medical History:  Diagnosis Date  . Abnormal PSA 01/12/2017  . BCC (basal cell carcinoma), face 01/12/2017  . BPH (benign prostatic hyperplasia) 04/15/2014  . Cataract 07/09/2015  . Hematuria 07/09/2015  . Medicare annual wellness visit, subsequent 10/21/2014  . Other and unspecified hyperlipidemia 04/15/2014  . SCC (squamous cell carcinoma) 10/21/2014  . Tick bite 2007   Never assessed by a physician.  Marland Kitchen Unspecified constipation 03/08/2013    No past surgical history on file.  Family History  Problem Relation Age of Onset  . Arthritis Mother        rheumatoid  . Heart disease Mother        rheumatic fever  . Hypertension Mother   . Heart disease Father        MI  . Dementia Father   . Hypertension Sister   . Hypertension Son   . Obesity Son   . Aneurysm Maternal Grandmother   . Dementia Maternal Grandfather   . Heart disease Paternal Grandmother        MI  . Kidney disease Paternal Grandfather        bright's disease    Social History   Socioeconomic History  . Marital status: Widowed    Spouse name: Not on file  . Number of children: Not on file  . Years of education: Not on file  . Highest education level: Not on file  Occupational History  . Not on file  Social Needs  . Financial resource strain: Not on file  . Food  insecurity:    Worry: Not on file    Inability: Not on file  . Transportation needs:    Medical: Not on file    Non-medical: Not on file  Tobacco Use  . Smoking status: Former Research scientist (life sciences)  . Smokeless tobacco: Never Used  Substance and Sexual Activity  . Alcohol use: Yes    Comment: rare  . Drug use: No  . Sexual activity: Not on file  Lifestyle  . Physical activity:    Days per week: Not on file    Minutes per session: Not on file  . Stress: Not on file  Relationships  . Social connections:    Talks on phone: Not on file    Gets together: Not on file    Attends religious service: Not on file    Active member of club or organization: Not on file    Attends meetings of clubs or organizations: Not on file    Relationship status: Not on file  . Intimate partner violence:    Fear of current or ex partner: Not on file    Emotionally abused: Not on file    Physically abused: Not on file    Forced sexual activity: Not on file  Other Topics Concern  . Not on file  Social History  Narrative  . Not on file    Outpatient Medications Prior to Visit  Medication Sig Dispense Refill  . Ascorbic Acid (VITAMIN C) POWD Take 333 mg by mouth daily. Emergen-C-take 1/3 packet daily.    . B Complex-C (SUPER B COMPLEX PO) Take by mouth daily.    . Camphor-Menthol-Methyl Sal (TIGER BALM MUSCLE RUB EX) Apply topically at bedtime.    . Cholecalciferol (VITAMIN D-3) 1000 UNITS CAPS Take by mouth daily.    Marland Kitchen ibuprofen (ADVIL,MOTRIN) 200 MG tablet Take 200 mg by mouth every 6 (six) hours as needed.     No facility-administered medications prior to visit.     Allergies  Allergen Reactions  . Yellow Jacket Venom [Bee Venom] Hives    Review of Systems  Constitutional: Negative for fever and malaise/fatigue.  HENT: Negative for congestion.   Eyes: Negative for blurred vision.  Respiratory: Negative for shortness of breath.   Cardiovascular: Negative for chest pain, palpitations and leg swelling.    Gastrointestinal: Negative for abdominal pain, blood in stool and nausea.  Genitourinary: Negative for dysuria and frequency.  Musculoskeletal: Negative for falls.  Skin: Negative for rash.  Neurological: Negative for dizziness, loss of consciousness and headaches.  Endo/Heme/Allergies: Negative for environmental allergies.  Psychiatric/Behavioral: Negative for depression. The patient is not nervous/anxious.        Objective:    Physical Exam  Constitutional: He is oriented to person, place, and time. He appears well-developed and well-nourished. No distress.  HENT:  Head: Normocephalic and atraumatic.  Nose: Nose normal.  Eyes: Right eye exhibits no discharge. Left eye exhibits no discharge.  Neck: Normal range of motion. Neck supple.  Cardiovascular: Normal rate and regular rhythm.  No murmur heard. Pulmonary/Chest: Effort normal and breath sounds normal.  Abdominal: Soft. Bowel sounds are normal. There is no tenderness.  Musculoskeletal: He exhibits no edema.  Neurological: He is alert and oriented to person, place, and time.  Skin: Skin is warm and dry.  Psychiatric: He has a normal mood and affect.  Nursing note and vitals reviewed.   There were no vitals taken for this visit. Wt Readings from Last 3 Encounters:  01/13/18 151 lb 9.6 oz (68.8 kg)  07/13/17 145 lb 6.4 oz (66 kg)  01/12/17 150 lb 3.2 oz (68.1 kg)   BP Readings from Last 3 Encounters:  03/03/18 (!) 158/86  01/13/18 (!) 160/78  07/26/17 (!) 143/68     Immunization History  Administered Date(s) Administered  . Pneumococcal Conjugate-13 10/15/2014  . Pneumococcal Polysaccharide-23 09/05/2012  . Tdap 09/05/2012    Health Maintenance  Topic Date Due  . INFLUENZA VACCINE  07/07/2018  . TETANUS/TDAP  09/05/2022  . PNA vac Low Risk Adult  Completed    Lab Results  Component Value Date   WBC 5.4 03/03/2018   HGB 13.6 03/03/2018   HCT 40.3 03/03/2018   PLT 202.0 03/03/2018   GLUCOSE 92 03/03/2018    CHOL 157 03/03/2018   TRIG 67.0 03/03/2018   HDL 45.30 03/03/2018   LDLCALC 98 03/03/2018   ALT 26 03/03/2018   AST 31 03/03/2018   NA 139 03/03/2018   K 4.0 03/03/2018   CL 104 03/03/2018   CREATININE 0.69 03/03/2018   BUN 19 03/03/2018   CO2 30 03/03/2018   TSH 1.93 03/03/2018   PSA 4.62 (H) 10/15/2014   HGBA1C 5.4 03/03/2018    Lab Results  Component Value Date   TSH 1.93 03/03/2018   Lab Results  Component Value Date  WBC 5.4 03/03/2018   HGB 13.6 03/03/2018   HCT 40.3 03/03/2018   MCV 92.1 03/03/2018   PLT 202.0 03/03/2018   Lab Results  Component Value Date   NA 139 03/03/2018   K 4.0 03/03/2018   CO2 30 03/03/2018   GLUCOSE 92 03/03/2018   BUN 19 03/03/2018   CREATININE 0.69 03/03/2018   BILITOT 1.1 03/03/2018   ALKPHOS 48 03/03/2018   AST 31 03/03/2018   ALT 26 03/03/2018   PROT 6.8 03/03/2018   ALBUMIN 4.0 03/03/2018   CALCIUM 9.2 03/03/2018   GFR 117.53 03/03/2018   Lab Results  Component Value Date   CHOL 157 03/03/2018   Lab Results  Component Value Date   HDL 45.30 03/03/2018   Lab Results  Component Value Date   LDLCALC 98 03/03/2018   Lab Results  Component Value Date   TRIG 67.0 03/03/2018   Lab Results  Component Value Date   CHOLHDL 3 03/03/2018   Lab Results  Component Value Date   HGBA1C 5.4 03/03/2018         Assessment & Plan:   Problem List Items Addressed This Visit    None      I am having Troy Mccann maintain his Vitamin D-3, Camphor-Menthol-Methyl Sal (TIGER BALM MUSCLE RUB EX), Vitamin C, B Complex-C (SUPER B COMPLEX PO), and ibuprofen.  No orders of the defined types were placed in this encounter.   CMA served as Education administrator during this visit. History, Physical and Plan performed by medical provider. Documentation and orders reviewed and attested to.  Magdalene Molly, [place]

## 2018-04-15 DIAGNOSIS — R739 Hyperglycemia, unspecified: Secondary | ICD-10-CM | POA: Insufficient documentation

## 2018-04-15 NOTE — Assessment & Plan Note (Signed)
Well controlled, no changes to meds. Encouraged heart healthy diet such as the DASH diet and exercise as tolerated.  °

## 2018-04-15 NOTE — Assessment & Plan Note (Signed)
hgba1c acceptable, minimize simple carbs. Increase exercise as tolerated.  

## 2018-07-14 ENCOUNTER — Encounter: Payer: Self-pay | Admitting: Family Medicine

## 2018-07-14 ENCOUNTER — Ambulatory Visit (INDEPENDENT_AMBULATORY_CARE_PROVIDER_SITE_OTHER): Payer: Medicare HMO | Admitting: Family Medicine

## 2018-07-14 VITALS — BP 130/70 | HR 70 | Temp 98.0°F | Resp 18 | Ht 66.0 in | Wt 143.4 lb

## 2018-07-14 DIAGNOSIS — I10 Essential (primary) hypertension: Secondary | ICD-10-CM

## 2018-07-14 DIAGNOSIS — R972 Elevated prostate specific antigen [PSA]: Secondary | ICD-10-CM

## 2018-07-14 DIAGNOSIS — E785 Hyperlipidemia, unspecified: Secondary | ICD-10-CM | POA: Diagnosis not present

## 2018-07-14 DIAGNOSIS — R739 Hyperglycemia, unspecified: Secondary | ICD-10-CM

## 2018-07-14 LAB — CBC
HCT: 41.7 % (ref 39.0–52.0)
HEMOGLOBIN: 14 g/dL (ref 13.0–17.0)
MCHC: 33.6 g/dL (ref 30.0–36.0)
MCV: 93.1 fl (ref 78.0–100.0)
Platelets: 205 10*3/uL (ref 150.0–400.0)
RBC: 4.49 Mil/uL (ref 4.22–5.81)
RDW: 13.5 % (ref 11.5–15.5)
WBC: 6.2 10*3/uL (ref 4.0–10.5)

## 2018-07-14 LAB — LIPID PANEL
CHOL/HDL RATIO: 4
Cholesterol: 159 mg/dL (ref 0–200)
HDL: 43.7 mg/dL (ref >39.00)
LDL CALC: 98 mg/dL (ref 0–99)
NONHDL: 115.65
Triglycerides: 89 mg/dL (ref 0.0–149.0)
VLDL: 17.8 mg/dL (ref 0.0–40.0)

## 2018-07-14 LAB — COMPREHENSIVE METABOLIC PANEL
ALT: 20 U/L (ref 0–53)
AST: 26 U/L (ref 0–37)
Albumin: 4.3 g/dL (ref 3.5–5.2)
Alkaline Phosphatase: 48 U/L (ref 39–117)
BILIRUBIN TOTAL: 1.3 mg/dL — AB (ref 0.2–1.2)
BUN: 20 mg/dL (ref 6–23)
CHLORIDE: 104 meq/L (ref 96–112)
CO2: 31 meq/L (ref 19–32)
Calcium: 9.4 mg/dL (ref 8.4–10.5)
Creatinine, Ser: 0.69 mg/dL (ref 0.40–1.50)
GFR: 117.42 mL/min (ref >60.00)
GLUCOSE: 90 mg/dL (ref 70–99)
Potassium: 4.3 mEq/L (ref 3.5–5.1)
Sodium: 139 mEq/L (ref 135–145)
Total Protein: 6.6 g/dL (ref 6.0–8.3)

## 2018-07-14 LAB — HEMOGLOBIN A1C: HEMOGLOBIN A1C: 5.4 % (ref 4.6–6.5)

## 2018-07-14 LAB — TSH: TSH: 1.43 u[IU]/mL (ref 0.35–4.50)

## 2018-07-14 NOTE — Assessment & Plan Note (Signed)
Well controlled, no changes to meds. Encouraged heart healthy diet such as the DASH diet and exercise as tolerated.  °

## 2018-07-14 NOTE — Assessment & Plan Note (Signed)
Tolerating statin, encouraged heart healthy diet, avoid trans fats, minimize simple carbs and saturated fats. Increase exercise as tolerated 

## 2018-07-14 NOTE — Progress Notes (Signed)
Subjective:  I acted as a Education administrator for Dr. Charlett Blake. [place], [place]  Patient ID: Troy Mccann, male    DOB: 07/30/1941, 77 y.o.   MRN: [id number]  No chief complaint on file.   HPI  Patient is in today for 6 month follow up. Patient has no acute concerns. No recent febrile illness or acute hospitalizations. Denies CP/palp/SOB/HA/congestion/fevers/GI or GU c/o. Taking meds as prescribed. No polyuria or polydipsia. Is trying to maintain a heart healthy diet and stay active.   Patient Care Team: Mosie Lukes, MD as PCP - General (Family Medicine) Renette Butters, MD as Attending Physician (Orthopedic Surgery) Laroy Apple, MD as Referring Physician (Physical Medicine and Rehabilitation)   Past Medical History:  Diagnosis Date  . Abnormal PSA 01/12/2017  . BCC (basal cell carcinoma), face 01/12/2017  . BPH (benign prostatic hyperplasia) 04/15/2014  . Cataract 07/09/2015  . Hematuria 07/09/2015  . Medicare annual wellness visit, subsequent 10/21/2014  . Other and unspecified hyperlipidemia 04/15/2014  . SCC (squamous cell carcinoma) 10/21/2014  . Tick bite 2007   Never assessed by a physician.  Marland Kitchen Unspecified constipation 03/08/2013    History reviewed. No pertinent surgical history.  Family History  Problem Relation Age of Onset  . Arthritis Mother        rheumatoid  . Heart disease Mother        rheumatic fever  . Hypertension Mother   . Heart disease Father        MI  . Dementia Father   . Hypertension Sister   . Hypertension Son   . Obesity Son   . Aneurysm Maternal Grandmother   . Dementia Maternal Grandfather   . Heart disease Paternal Grandmother        MI  . Kidney disease Paternal Grandfather        bright's disease    Social History   Socioeconomic History  . Marital status: Widowed    Spouse name: Not on file  . Number of children: Not on file  . Years of education: Not on file  . Highest education level: Not on file  Occupational History  . Not on file    Social Needs  . Financial resource strain: Not on file  . Food insecurity:    Worry: Not on file    Inability: Not on file  . Transportation needs:    Medical: Not on file    Non-medical: Not on file  Tobacco Use  . Smoking status: Former Research scientist (life sciences)  . Smokeless tobacco: Never Used  Substance and Sexual Activity  . Alcohol use: Not Currently  . Drug use: No  . Sexual activity: Not on file  Lifestyle  . Physical activity:    Days per week: Not on file    Minutes per session: Not on file  . Stress: Not on file  Relationships  . Social connections:    Talks on phone: Not on file    Gets together: Not on file    Attends religious service: Not on file    Active member of club or organization: Not on file    Attends meetings of clubs or organizations: Not on file    Relationship status: Not on file  . Intimate partner violence:    Fear of current or ex partner: Not on file    Emotionally abused: Not on file    Physically abused: Not on file    Forced sexual activity: Not on file  Other Topics Concern  .  Not on file  Social History Narrative  . Not on file    Outpatient Medications Prior to Visit  Medication Sig Dispense Refill  . Ascorbic Acid (VITAMIN C) POWD Take 333 mg by mouth daily. Emergen-C-take 1/3 packet daily.    . B Complex-C (SUPER B COMPLEX PO) Take by mouth daily.    . Camphor-Menthol-Methyl Sal (TIGER BALM MUSCLE RUB EX) Apply topically at bedtime.    . Cholecalciferol (VITAMIN D-3) 1000 UNITS CAPS Take by mouth daily.    Marland Kitchen ibuprofen (ADVIL,MOTRIN) 200 MG tablet Take 200 mg by mouth every 6 (six) hours as needed.     No facility-administered medications prior to visit.     Allergies  Allergen Reactions  . Yellow Jacket Venom [Bee Venom] Hives    Review of Systems  Constitutional: Negative for fever and malaise/fatigue.  HENT: Negative for congestion.   Eyes: Negative for blurred vision.  Respiratory: Negative for shortness of breath.    Cardiovascular: Negative for chest pain, palpitations and leg swelling.  Gastrointestinal: Negative for abdominal pain, blood in stool and nausea.  Genitourinary: Negative for dysuria and frequency.  Musculoskeletal: Negative for falls.  Skin: Negative for rash.  Neurological: Negative for dizziness, loss of consciousness and headaches.  Endo/Heme/Allergies: Negative for environmental allergies.  Psychiatric/Behavioral: Negative for depression. The patient is not nervous/anxious.        Objective:    Physical Exam  Constitutional: He is oriented to person, place, and time. He appears well-developed and well-nourished. No distress.  HENT:  Head: Normocephalic and atraumatic.  Nose: Nose normal.  Eyes: Right eye exhibits no discharge. Left eye exhibits no discharge.  Neck: Normal range of motion. Neck supple.  Cardiovascular: Normal rate and regular rhythm.  No murmur heard. Pulmonary/Chest: Effort normal and breath sounds normal.  Abdominal: Soft. Bowel sounds are normal. There is no tenderness.  Musculoskeletal: He exhibits no edema.  Neurological: He is alert and oriented to person, place, and time.  Skin: Skin is warm and dry.  Psychiatric: He has a normal mood and affect.  Nursing note and vitals reviewed.   BP 130/70 (BP Location: Left Arm, Patient Position: Sitting, Cuff Size: Normal)   Pulse 70   Temp 98 F (36.7 C) (Oral)   Resp 18   Ht 5\' 6"  (1.676 m)   Wt 143 lb 6.4 oz (65 kg)   SpO2 95%   BMI 23.15 kg/m  Wt Readings from Last 3 Encounters:  07/14/18 143 lb 6.4 oz (65 kg)  04/12/18 146 lb 12.8 oz (66.6 kg)  01/13/18 151 lb 9.6 oz (68.8 kg)   BP Readings from Last 3 Encounters:  07/14/18 130/70  04/12/18 (!) 148/82  03/03/18 (!) 158/86     Immunization History  Administered Date(s) Administered  . Pneumococcal Conjugate-13 10/15/2014  . Pneumococcal Polysaccharide-23 09/05/2012  . Tdap 09/05/2012    Health Maintenance  Topic Date Due  . INFLUENZA  VACCINE  07/07/2018  . TETANUS/TDAP  09/05/2022  . PNA vac Low Risk Adult  Completed    Lab Results  Component Value Date   WBC 6.2 07/14/2018   HGB 14.0 07/14/2018   HCT 41.7 07/14/2018   PLT 205.0 07/14/2018   GLUCOSE 90 07/14/2018   CHOL 159 07/14/2018   TRIG 89.0 07/14/2018   HDL 43.70 07/14/2018   LDLCALC 98 07/14/2018   ALT 20 07/14/2018   AST 26 07/14/2018   NA 139 07/14/2018   K 4.3 07/14/2018   CL 104 07/14/2018   CREATININE  0.69 07/14/2018   BUN 20 07/14/2018   CO2 31 07/14/2018   TSH 1.43 07/14/2018   PSA 4.62 (H) 10/15/2014   HGBA1C 5.4 07/14/2018    Lab Results  Component Value Date   TSH 1.43 07/14/2018   Lab Results  Component Value Date   WBC 6.2 07/14/2018   HGB 14.0 07/14/2018   HCT 41.7 07/14/2018   MCV 93.1 07/14/2018   PLT 205.0 07/14/2018   Lab Results  Component Value Date   NA 139 07/14/2018   K 4.3 07/14/2018   CO2 31 07/14/2018   GLUCOSE 90 07/14/2018   BUN 20 07/14/2018   CREATININE 0.69 07/14/2018   BILITOT 1.3 (H) 07/14/2018   ALKPHOS 48 07/14/2018   AST 26 07/14/2018   ALT 20 07/14/2018   PROT 6.6 07/14/2018   ALBUMIN 4.3 07/14/2018   CALCIUM 9.4 07/14/2018   GFR 117.42 07/14/2018   Lab Results  Component Value Date   CHOL 159 07/14/2018   Lab Results  Component Value Date   HDL 43.70 07/14/2018   Lab Results  Component Value Date   LDLCALC 98 07/14/2018   Lab Results  Component Value Date   TRIG 89.0 07/14/2018   Lab Results  Component Value Date   CHOLHDL 4 07/14/2018   Lab Results  Component Value Date   HGBA1C 5.4 07/14/2018         Assessment & Plan:   Problem List Items Addressed This Visit    HTN (hypertension)    Well controlled, no changes to meds. Encouraged heart healthy diet such as the DASH diet and exercise as tolerated.       Relevant Orders   CBC (Completed)   Comprehensive metabolic panel (Completed)   TSH (Completed)   Hyperlipidemia    Tolerating statin, encouraged heart  healthy diet, avoid trans fats, minimize simple carbs and saturated fats. Increase exercise as tolerated      Relevant Orders   Lipid panel (Completed)   Abnormal PSA - Primary    eferred to urology for monitoring      Relevant Orders   Ambulatory referral to Urology   Hyperglycemia    hgba1c acceptable, minimize simple carbs. Increase exercise as tolerated. Continue current meds      Relevant Orders   Hemoglobin A1c (Completed)      I am having Troy Mccann maintain his Vitamin D-3, Camphor-Menthol-Methyl Sal (TIGER BALM MUSCLE RUB EX), Vitamin C, B Complex-C (SUPER B COMPLEX PO), and ibuprofen.  No orders of the defined types were placed in this encounter.   CMA served as Education administrator during this visit. History, Physical and Plan performed by medical provider. Documentation and orders reviewed and attested to.  Penni Homans, MD

## 2018-07-14 NOTE — Assessment & Plan Note (Signed)
hgba1c acceptable, minimize simple carbs. Increase exercise as tolerated. Continue current meds 

## 2018-07-14 NOTE — Patient Instructions (Addendum)
Encouraged increased hydration and fiber in diet. Daily probiotics. If bowels not moving can use MOM 2 tbls po in 4 oz of warm prune juice by mouth every 2-3 days. If no results then repeat in 4 hours with  Dulcolax suppository pr, may repeat again in 4 more hours as needed. Seek care if symptoms worsen. Consider daily Miralax and/or Dulcolax if symptoms persist.   Hypertension Hypertension, commonly called high blood pressure, is when the force of blood pumping through the arteries is too strong. The arteries are the blood vessels that carry blood from the heart throughout the body. Hypertension forces the heart to work harder to pump blood and may cause arteries to become narrow or stiff. Having untreated or uncontrolled hypertension can cause heart attacks, strokes, kidney disease, and other problems. A blood pressure reading consists of a higher number over a lower number. Ideally, your blood pressure should be below 120/80. The first ("top") number is called the systolic pressure. It is a measure of the pressure in your arteries as your heart beats. The second ("bottom") number is called the diastolic pressure. It is a measure of the pressure in your arteries as the heart relaxes. What are the causes? The cause of this condition is not known. What increases the risk? Some risk factors for high blood pressure are under your control. Others are not. Factors you can change  Smoking.  Having type 2 diabetes mellitus, high cholesterol, or both.  Not getting enough exercise or physical activity.  Being overweight.  Having too much fat, sugar, calories, or salt (sodium) in your diet.  Drinking too much alcohol. Factors that are difficult or impossible to change  Having chronic kidney disease.  Having a family history of high blood pressure.  Age. Risk increases with age.  Race. You may be at higher risk if you are African-American.  Gender. Men are at higher risk than women before age  45. After age 65, women are at higher risk than men.  Having obstructive sleep apnea.  Stress. What are the signs or symptoms? Extremely high blood pressure (hypertensive crisis) may cause:  Headache.  Anxiety.  Shortness of breath.  Nosebleed.  Nausea and vomiting.  Severe chest pain.  Jerky movements you cannot control (seizures).  How is this diagnosed? This condition is diagnosed by measuring your blood pressure while you are seated, with your arm resting on a surface. The cuff of the blood pressure monitor will be placed directly against the skin of your upper arm at the level of your heart. It should be measured at least twice using the same arm. Certain conditions can cause a difference in blood pressure between your right and left arms. Certain factors can cause blood pressure readings to be lower or higher than normal (elevated) for a short period of time:  When your blood pressure is higher when you are in a health care provider's office than when you are at home, this is called white coat hypertension. Most people with this condition do not need medicines.  When your blood pressure is higher at home than when you are in a health care provider's office, this is called masked hypertension. Most people with this condition may need medicines to control blood pressure.  If you have a high blood pressure reading during one visit or you have normal blood pressure with other risk factors:  You may be asked to return on a different day to have your blood pressure checked again.  You may   be asked to monitor your blood pressure at home for 1 week or longer.  If you are diagnosed with hypertension, you may have other blood or imaging tests to help your health care provider understand your overall risk for other conditions. How is this treated? This condition is treated by making healthy lifestyle changes, such as eating healthy foods, exercising more, and reducing your alcohol  intake. Your health care provider may prescribe medicine if lifestyle changes are not enough to get your blood pressure under control, and if:  Your systolic blood pressure is above 130.  Your diastolic blood pressure is above 80.  Your personal target blood pressure may vary depending on your medical conditions, your age, and other factors. Follow these instructions at home: Eating and drinking  Eat a diet that is high in fiber and potassium, and low in sodium, added sugar, and fat. An example eating plan is called the DASH (Dietary Approaches to Stop Hypertension) diet. To eat this way: ? Eat plenty of fresh fruits and vegetables. Try to fill half of your plate at each meal with fruits and vegetables. ? Eat whole grains, such as whole wheat pasta, brown rice, or whole grain bread. Fill about one quarter of your plate with whole grains. ? Eat or drink low-fat dairy products, such as skim milk or low-fat yogurt. ? Avoid fatty cuts of meat, processed or cured meats, and poultry with skin. Fill about one quarter of your plate with lean proteins, such as fish, chicken without skin, beans, eggs, and tofu. ? Avoid premade and processed foods. These tend to be higher in sodium, added sugar, and fat.  Reduce your daily sodium intake. Most people with hypertension should eat less than 1,500 mg of sodium a day.  Limit alcohol intake to no more than 1 drink a day for nonpregnant women and 2 drinks a day for men. One drink equals 12 oz of beer, 5 oz of wine, or 1 oz of hard liquor. Lifestyle  Work with your health care provider to maintain a healthy body weight or to lose weight. Ask what an ideal weight is for you.  Get at least 30 minutes of exercise that causes your heart to beat faster (aerobic exercise) most days of the week. Activities may include walking, swimming, or biking.  Include exercise to strengthen your muscles (resistance exercise), such as pilates or lifting weights, as part of your  weekly exercise routine. Try to do these types of exercises for 30 minutes at least 3 days a week.  Do not use any products that contain nicotine or tobacco, such as cigarettes and e-cigarettes. If you need help quitting, ask your health care provider.  Monitor your blood pressure at home as told by your health care provider.  Keep all follow-up visits as told by your health care provider. This is important. Medicines  Take over-the-counter and prescription medicines only as told by your health care provider. Follow directions carefully. Blood pressure medicines must be taken as prescribed.  Do not skip doses of blood pressure medicine. Doing this puts you at risk for problems and can make the medicine less effective.  Ask your health care provider about side effects or reactions to medicines that you should watch for. Contact a health care provider if:  You think you are having a reaction to a medicine you are taking.  You have headaches that keep coming back (recurring).  You feel dizzy.  You have swelling in your ankles.  You have   trouble with your vision. Get help right away if:  You develop a severe headache or confusion.  You have unusual weakness or numbness.  You feel faint.  You have severe pain in your chest or abdomen.  You vomit repeatedly.  You have trouble breathing. Summary  Hypertension is when the force of blood pumping through your arteries is too strong. If this condition is not controlled, it may put you at risk for serious complications.  Your personal target blood pressure may vary depending on your medical conditions, your age, and other factors. For most people, a normal blood pressure is less than 120/80.  Hypertension is treated with lifestyle changes, medicines, or a combination of both. Lifestyle changes include weight loss, eating a healthy, low-sodium diet, exercising more, and limiting alcohol. This information is not intended to replace  advice given to you by your health care provider. Make sure you discuss any questions you have with your health care provider. Document Released: 11/23/2005 Document Revised: 10/21/2016 Document Reviewed: 10/21/2016 Elsevier Interactive Patient Education  2018 [employer] Inc[employer]  

## 2018-07-17 NOTE — Assessment & Plan Note (Signed)
eferred to urology for monitoring

## 2018-08-30 DIAGNOSIS — Z08 Encounter for follow-up examination after completed treatment for malignant neoplasm: Secondary | ICD-10-CM | POA: Diagnosis not present

## 2018-08-30 DIAGNOSIS — L820 Inflamed seborrheic keratosis: Secondary | ICD-10-CM | POA: Diagnosis not present

## 2018-08-30 DIAGNOSIS — Z85828 Personal history of other malignant neoplasm of skin: Secondary | ICD-10-CM | POA: Diagnosis not present

## 2018-08-30 DIAGNOSIS — L570 Actinic keratosis: Secondary | ICD-10-CM | POA: Diagnosis not present

## 2018-08-30 DIAGNOSIS — D1801 Hemangioma of skin and subcutaneous tissue: Secondary | ICD-10-CM | POA: Diagnosis not present

## 2018-08-30 DIAGNOSIS — L821 Other seborrheic keratosis: Secondary | ICD-10-CM | POA: Diagnosis not present

## 2018-08-30 DIAGNOSIS — C4442 Squamous cell carcinoma of skin of scalp and neck: Secondary | ICD-10-CM | POA: Diagnosis not present

## 2018-08-30 DIAGNOSIS — D0421 Carcinoma in situ of skin of right ear and external auricular canal: Secondary | ICD-10-CM | POA: Diagnosis not present

## 2018-08-30 DIAGNOSIS — D485 Neoplasm of uncertain behavior of skin: Secondary | ICD-10-CM | POA: Diagnosis not present

## 2018-09-29 DIAGNOSIS — C4442 Squamous cell carcinoma of skin of scalp and neck: Secondary | ICD-10-CM | POA: Diagnosis not present

## 2019-01-16 ENCOUNTER — Ambulatory Visit: Payer: Medicare HMO | Admitting: Family Medicine

## 2019-01-19 ENCOUNTER — Ambulatory Visit (INDEPENDENT_AMBULATORY_CARE_PROVIDER_SITE_OTHER): Payer: Medicare HMO | Admitting: Family Medicine

## 2019-01-19 DIAGNOSIS — Z0000 Encounter for general adult medical examination without abnormal findings: Secondary | ICD-10-CM | POA: Diagnosis not present

## 2019-01-19 DIAGNOSIS — M549 Dorsalgia, unspecified: Secondary | ICD-10-CM | POA: Diagnosis not present

## 2019-01-19 DIAGNOSIS — R634 Abnormal weight loss: Secondary | ICD-10-CM | POA: Diagnosis not present

## 2019-01-19 DIAGNOSIS — I10 Essential (primary) hypertension: Secondary | ICD-10-CM | POA: Diagnosis not present

## 2019-01-19 DIAGNOSIS — R739 Hyperglycemia, unspecified: Secondary | ICD-10-CM | POA: Diagnosis not present

## 2019-01-19 DIAGNOSIS — G8929 Other chronic pain: Secondary | ICD-10-CM

## 2019-01-19 DIAGNOSIS — R972 Elevated prostate specific antigen [PSA]: Secondary | ICD-10-CM

## 2019-01-19 DIAGNOSIS — E785 Hyperlipidemia, unspecified: Secondary | ICD-10-CM

## 2019-01-19 LAB — CBC
HCT: 42.7 % (ref 39.0–52.0)
HEMOGLOBIN: 14.2 g/dL (ref 13.0–17.0)
MCHC: 33.2 g/dL (ref 30.0–36.0)
MCV: 93.2 fl (ref 78.0–100.0)
PLATELETS: 207 10*3/uL (ref 150.0–400.0)
RBC: 4.58 Mil/uL (ref 4.22–5.81)
RDW: 13.6 % (ref 11.5–15.5)
WBC: 5.4 10*3/uL (ref 4.0–10.5)

## 2019-01-19 LAB — COMPREHENSIVE METABOLIC PANEL
ALK PHOS: 53 U/L (ref 39–117)
ALT: 24 U/L (ref 0–53)
AST: 31 U/L (ref 0–37)
Albumin: 4.5 g/dL (ref 3.5–5.2)
BILIRUBIN TOTAL: 1.4 mg/dL — AB (ref 0.2–1.2)
BUN: 16 mg/dL (ref 6–23)
CALCIUM: 9.3 mg/dL (ref 8.4–10.5)
CO2: 28 mEq/L (ref 19–32)
CREATININE: 0.71 mg/dL (ref 0.40–1.50)
Chloride: 105 mEq/L (ref 96–112)
GFR: 106.75 mL/min (ref >60.00)
GLUCOSE: 87 mg/dL (ref 70–99)
Potassium: 4 mEq/L (ref 3.5–5.1)
Sodium: 140 mEq/L (ref 135–145)
TOTAL PROTEIN: 6.7 g/dL (ref 6.0–8.3)

## 2019-01-19 LAB — LIPID PANEL
CHOL/HDL RATIO: 4
Cholesterol: 159 mg/dL (ref 0–200)
HDL: 45.1 mg/dL (ref >39.00)
LDL Cholesterol: 96 mg/dL (ref 0–99)
NONHDL: 114.2
TRIGLYCERIDES: 89 mg/dL (ref 0.0–149.0)
VLDL: 17.8 mg/dL (ref 0.0–40.0)

## 2019-01-19 LAB — TSH: TSH: 1.83 u[IU]/mL (ref 0.35–4.50)

## 2019-01-19 LAB — PSA: PSA: 10.02 ng/mL — AB (ref 0.10–4.00)

## 2019-01-19 NOTE — Assessment & Plan Note (Signed)
Encouraged heart healthy diet, increase exercise, avoid trans fats, consider a krill oil cap daily 

## 2019-01-19 NOTE — Progress Notes (Signed)
Subjective:    Patient ID: Troy Mccann, male    DOB: 07/10/1941, 78 y.o.   MRN: [id number]  No chief complaint on file.   HPI Patient is in today for follow up on chronic medical concerns and annual. He feels well. No recent febrile illness or hospitalizations. He has been eating a heart healthy diet and stays active. His back continues to bother him and he is now following with [facility] orthopaedics for worsening DDD. His pain is tolerable and no incontinence or radiculopathy is noted. He has undergone some physical therapy and that has been helpful. Denies CP/palp/SOB/HA/congestion/fevers/GI or GU c/o. Taking meds as prescribed  Past Medical History:  Diagnosis Date  . Abnormal PSA 01/12/2017  . BCC (basal cell carcinoma), face 01/12/2017  . BPH (benign prostatic hyperplasia) 04/15/2014  . Cataract 07/09/2015  . Hematuria 07/09/2015  . Medicare annual wellness visit, subsequent 10/21/2014  . Other and unspecified hyperlipidemia 04/15/2014  . SCC (squamous cell carcinoma) 10/21/2014  . Tick bite 2007   Never assessed by a physician.  Marland Kitchen Unspecified constipation 03/08/2013    No past surgical history on file.  Family History  Problem Relation Age of Onset  . Arthritis Mother        rheumatoid  . Heart disease Mother        rheumatic fever  . Hypertension Mother   . Heart disease Father        MI  . Dementia Father   . Hypertension Sister   . Hypertension Son   . Obesity Son   . Aneurysm Maternal Grandmother   . Dementia Maternal Grandfather   . Heart disease Paternal Grandmother        MI  . Kidney disease Paternal Grandfather        bright's disease    Social History   Socioeconomic History  . Marital status: Widowed    Spouse name: Not on file  . Number of children: Not on file  . Years of education: Not on file  . Highest education level: Not on file  Occupational History  . Not on file  Social Needs  . Financial resource strain: Not on file  . Food  insecurity:    Worry: Not on file    Inability: Not on file  . Transportation needs:    Medical: Not on file    Non-medical: Not on file  Tobacco Use  . Smoking status: Former Research scientist (life sciences)  . Smokeless tobacco: Never Used  Substance and Sexual Activity  . Alcohol use: Not Currently  . Drug use: No  . Sexual activity: Not on file  Lifestyle  . Physical activity:    Days per week: Not on file    Minutes per session: Not on file  . Stress: Not on file  Relationships  . Social connections:    Talks on phone: Not on file    Gets together: Not on file    Attends religious service: Not on file    Active member of club or organization: Not on file    Attends meetings of clubs or organizations: Not on file    Relationship status: Not on file  . Intimate partner violence:    Fear of current or ex partner: Not on file    Emotionally abused: Not on file    Physically abused: Not on file    Forced sexual activity: Not on file  Other Topics Concern  . Not on file  Social History Narrative  .  Not on file    Outpatient Medications Prior to Visit  Medication Sig Dispense Refill  . Ascorbic Acid (VITAMIN C) POWD Take 333 mg by mouth daily. Emergen-C-take 1/3 packet daily.    . B Complex-C (SUPER B COMPLEX PO) Take by mouth daily.    . Camphor-Menthol-Methyl Sal (TIGER BALM MUSCLE RUB EX) Apply topically at bedtime.    . Cholecalciferol (VITAMIN D-3) 1000 UNITS CAPS Take by mouth daily.    Marland Kitchen ibuprofen (ADVIL,MOTRIN) 200 MG tablet Take 200 mg by mouth every 6 (six) hours as needed.     No facility-administered medications prior to visit.     Allergies  Allergen Reactions  . Yellow Jacket Venom [Bee Venom] Hives    Review of Systems  Constitutional: Negative for chills, fever and malaise/fatigue.  HENT: Negative for congestion and hearing loss.   Eyes: Negative for discharge.  Respiratory: Negative for cough, sputum production and shortness of breath.   Cardiovascular: Negative for  chest pain, palpitations and leg swelling.  Gastrointestinal: Negative for abdominal pain, blood in stool, constipation, diarrhea, heartburn, nausea and vomiting.  Genitourinary: Negative for dysuria, frequency, hematuria and urgency.  Musculoskeletal: Positive for back pain. Negative for falls and myalgias.  Skin: Negative for rash.  Neurological: Negative for dizziness, sensory change, loss of consciousness, weakness and headaches.  Endo/Heme/Allergies: Negative for environmental allergies. Does not bruise/bleed easily.  Psychiatric/Behavioral: Negative for depression and suicidal ideas. The patient is not nervous/anxious and does not have insomnia.        Objective:    Physical Exam Vitals signs and nursing note reviewed.  Constitutional:      General: He is not in acute distress.    Appearance: He is well-developed.  HENT:     Head: Normocephalic and atraumatic.     Right Ear: Tympanic membrane, ear canal and external ear normal.     Left Ear: Tympanic membrane, ear canal and external ear normal.     Nose: Nose normal.  Eyes:     General:        Right eye: No discharge.        Left eye: No discharge.  Neck:     Musculoskeletal: Normal range of motion and neck supple.  Cardiovascular:     Rate and Rhythm: Normal rate and regular rhythm.     Heart sounds: Normal heart sounds. No murmur.  Pulmonary:     Effort: Pulmonary effort is normal.     Breath sounds: Normal breath sounds. No wheezing.  Abdominal:     General: Bowel sounds are normal.     Palpations: Abdomen is soft. There is no mass.     Tenderness: There is no abdominal tenderness. There is no guarding.  Skin:    General: Skin is warm and dry.  Neurological:     Mental Status: He is alert and oriented to person, place, and time.     BP (!) 142/70 (BP Location: Left Arm, Patient Position: Sitting, Cuff Size: Normal)   Pulse 70   Temp 97.6 F (36.4 C) (Oral)   Resp 18   Ht 5\' 4"  (1.626 m)   Wt 150 lb (68 kg)    SpO2 97%   BMI 25.75 kg/m  Wt Readings from Last 3 Encounters:  01/19/19 150 lb (68 kg)  07/14/18 143 lb 6.4 oz (65 kg)  04/12/18 146 lb 12.8 oz (66.6 kg)     Lab Results  Component Value Date   WBC 5.4 01/19/2019   HGB 14.2  01/19/2019   HCT 42.7 01/19/2019   PLT 207.0 01/19/2019   GLUCOSE 87 01/19/2019   CHOL 159 01/19/2019   TRIG 89.0 01/19/2019   HDL 45.10 01/19/2019   LDLCALC 96 01/19/2019   ALT 24 01/19/2019   AST 31 01/19/2019   NA 140 01/19/2019   K 4.0 01/19/2019   CL 105 01/19/2019   CREATININE 0.71 01/19/2019   BUN 16 01/19/2019   CO2 28 01/19/2019   TSH 1.83 01/19/2019   PSA 10.02 (H) 01/19/2019   HGBA1C 5.4 07/14/2018    Lab Results  Component Value Date   TSH 1.83 01/19/2019   Lab Results  Component Value Date   WBC 5.4 01/19/2019   HGB 14.2 01/19/2019   HCT 42.7 01/19/2019   MCV 93.2 01/19/2019   PLT 207.0 01/19/2019   Lab Results  Component Value Date   NA 140 01/19/2019   K 4.0 01/19/2019   CO2 28 01/19/2019   GLUCOSE 87 01/19/2019   BUN 16 01/19/2019   CREATININE 0.71 01/19/2019   BILITOT 1.4 (H) 01/19/2019   ALKPHOS 53 01/19/2019   AST 31 01/19/2019   ALT 24 01/19/2019   PROT 6.7 01/19/2019   ALBUMIN 4.5 01/19/2019   CALCIUM 9.3 01/19/2019   GFR 106.75 01/19/2019   Lab Results  Component Value Date   CHOL 159 01/19/2019   Lab Results  Component Value Date   HDL 45.10 01/19/2019   Lab Results  Component Value Date   LDLCALC 96 01/19/2019   Lab Results  Component Value Date   TRIG 89.0 01/19/2019   Lab Results  Component Value Date   CHOLHDL 4 01/19/2019   Lab Results  Component Value Date   HGBA1C 5.4 07/14/2018       Assessment & Plan:   Problem List Items Addressed This Visit    Weight loss    Good weight gain since last visit. Continue to up protein       Chronic back pain    Is now following with [facility] and is trying to stay active and avoid any procedures. He has worsening DDD      HTN  (hypertension)    Well controlled, no changes to meds. Encouraged heart healthy diet such as the DASH diet and exercise as tolerated.       Relevant Orders   CBC (Completed)   Comprehensive metabolic panel (Completed)   TSH (Completed)   Hyperlipidemia    Encouraged heart healthy diet, increase exercise, avoid trans fats, consider a krill oil cap daily      Relevant Orders   Lipid panel (Completed)   Preventative health care    Patient with medicare and is doing well.       Abnormal PSA    Reports his last PA at [facility] was around 7.99 but his urologist has left practice so has not followed up willrequest records and check PSA. Consistently has weak urine stream and urinary urgency but no significant changes. He is encouraged to return to urology for further consideration.      Relevant Orders   PSA (Completed)   Hyperglycemia    hgba1c acceptable, minimize simple carbs. Increase exercise as tolerated.          I am having Fritz Pickerel T. Malizia maintain his Vitamin D-3, Camphor-Menthol-Methyl Sal (TIGER BALM MUSCLE RUB EX), Vitamin C, B Complex-C (SUPER B COMPLEX PO), and ibuprofen.  No orders of the defined types were placed in this encounter.    Erline Levine  Charlett Blake, MD

## 2019-01-19 NOTE — Assessment & Plan Note (Signed)
hgba1c acceptable, minimize simple carbs. Increase exercise as tolerated.  

## 2019-01-19 NOTE — Assessment & Plan Note (Addendum)
Reports his last PA at [facility] was around 7.99 but his urologist has left practice so has not followed up willrequest records and check PSA. Consistently has weak urine stream and urinary urgency but no significant changes. He is encouraged to return to urology for further consideration.

## 2019-01-19 NOTE — Assessment & Plan Note (Signed)
Is now following with Ralie[facility] I[facility] and is trying to stay active and avoid any procedures. He has worsening DDD

## 2019-01-19 NOTE — Assessment & Plan Note (Signed)
Good weight gain since last visit. Continue to up protein

## 2019-01-19 NOTE — Patient Instructions (Signed)
shingrix is the new shingles shot, 2 shots over 2-6 months, at [facility] Mccann, Male Preventive care refers to lifestyle choices and visits with your health care provider that can promote health and wellness. What does preventive care include?   A yearly physical exam. This is also called an annual well check.  Dental exams once or twice a year.  Routine eye exams. Ask your health care provider how often you should have your eyes checked.  Personal lifestyle choices, including: ? Daily care of your teeth and gums. ? Regular physical activity. ? Eating a healthy diet. ? Avoiding tobacco and drug use. ? Limiting alcohol use. ? Practicing safe sex. ? Taking low doses of aspirin every day. ? Taking vitamin and mineral supplements as recommended by your health care provider. What happens during an annual well check? The services and screenings done by your health care provider during your annual well check will depend on your age, overall health, lifestyle risk factors, and family history of disease. Counseling Your health care provider may ask you questions about your:  Alcohol use.  Tobacco use.  Drug use.  Emotional well-being.  Home and relationship well-being.  Sexual activity.  Eating habits.  History of falls.  Memory and ability to understand (cognition).  Work and work Statistician. Screening You may have the following tests or measurements:  Height, weight, and BMI.  Blood pressure.  Lipid and cholesterol levels. These may be checked every 5 years, or more frequently if you are over 80 years old.  Skin check.  Lung cancer screening. You may have this screening every year starting at age 80 if you have a 30-pack-year history of smoking and currently smoke or have quit within the past 15 years.  Colorectal cancer screening. All adults should have this screening starting at age 80 and continuing until age 80. You will have tests  every 1-10 years, depending on your results and the type of screening test. People at increased risk should start screening at an earlier age. Screening tests may include: ? Guaiac-based fecal occult blood testing. ? Fecal immunochemical test (FIT). ? Stool DNA test. ? Virtual colonoscopy. ? Sigmoidoscopy. During this test, a flexible tube with a tiny camera (sigmoidoscope) is used to examine your rectum and lower colon. The sigmoidoscope is inserted through your anus into your rectum and lower colon. ? Colonoscopy. During this test, a long, thin, flexible tube with a tiny camera (colonoscope) is used to examine your entire colon and rectum.  Prostate cancer screening. Recommendations will vary depending on your family history and other risks.  Hepatitis C blood test.  Hepatitis B blood test.  Sexually transmitted disease (STD) testing.  Diabetes screening. This is done by checking your blood sugar (glucose) after you have not eaten for a while (fasting). You may have this done every 1-3 years.  Abdominal aortic aneurysm (AAA) screening. You may need this if you are a current or former smoker.  Osteoporosis. You may be screened starting at age 80 if you are at high risk. Talk with your health care provider about your test results, treatment options, and if necessary, the need for more tests. Vaccines Your health care provider may recommend certain vaccines, such as:  Influenza vaccine. This is recommended every year.  Tetanus, diphtheria, and acellular pertussis (Tdap, Td) vaccine. You may need a Td booster every 10 years.  Varicella vaccine. You may need this if you have not been vaccinated.  Zoster vaccine. You  may need this after age 80.  Measles, mumps, and rubella (MMR) vaccine. You may need at least one dose of MMR if you were born in 1957 or later. You may also need a second dose.  Pneumococcal 13-valent conjugate (PCV13) vaccine. One dose is recommended after age  80.  Pneumococcal polysaccharide (PPSV23) vaccine. One dose is recommended after age 80.  Meningococcal vaccine. You may need this if you have certain conditions.  Hepatitis A vaccine. You may need this if you have certain conditions or if you travel or work in places where you may be exposed to hepatitis A.  Hepatitis B vaccine. You may need this if you have certain conditions or if you travel or work in places where you may be exposed to hepatitis B.  Haemophilus influenzae type b (Hib) vaccine. You may need this if you have certain risk factors. Talk to your health care provider about which screenings and vaccines you need and how often you need them. This information is not intended to replace advice given to you by your health care provider. Make sure you discuss any questions you have with your health care provider. Document Released: 12/20/2015 Document Revised: 01/13/2018 Document Reviewed: 09/24/2015 Elsevier Interactive Patient Education  2019 [employer].

## 2019-01-19 NOTE — Assessment & Plan Note (Signed)
Patient with medicare and is doing well.

## 2019-01-19 NOTE — Assessment & Plan Note (Signed)
Well controlled, no changes to meds. Encouraged heart healthy diet such as the DASH diet and exercise as tolerated.  °

## 2019-01-23 ENCOUNTER — Telehealth: Payer: Self-pay | Admitting: Family Medicine

## 2019-01-23 DIAGNOSIS — R972 Elevated prostate specific antigen [PSA]: Secondary | ICD-10-CM

## 2019-01-23 DIAGNOSIS — N400 Enlarged prostate without lower urinary tract symptoms: Secondary | ICD-10-CM

## 2019-01-23 NOTE — Telephone Encounter (Signed)
Copied from [facility] [id number]. Topic: Referral - Request for Referral >> Jan 23, 2019 10:32 AM Bea Graff, NT wrote: Has patient seen PCP for this complaint? Yes.   *If NO, is insurance requiring patient see PCP for this issue before PCP can refer them? Referral for which specialty: Urology Preferred provider/office: [facility] Reason for referral: Previous urologist retired

## 2019-01-23 NOTE — Telephone Encounter (Signed)
Referral placed.

## 2019-01-26 ENCOUNTER — Telehealth: Payer: Self-pay | Admitting: *Deleted

## 2019-01-26 NOTE — Telephone Encounter (Signed)
Received Medical records/lab report from [facility]; forwarded to provider/SLS 02/20

## 2019-02-23 DIAGNOSIS — L817 Pigmented purpuric dermatosis: Secondary | ICD-10-CM | POA: Diagnosis not present

## 2019-02-23 DIAGNOSIS — Z85828 Personal history of other malignant neoplasm of skin: Secondary | ICD-10-CM | POA: Diagnosis not present

## 2019-02-23 DIAGNOSIS — L570 Actinic keratosis: Secondary | ICD-10-CM | POA: Diagnosis not present

## 2019-02-23 DIAGNOSIS — D225 Melanocytic nevi of trunk: Secondary | ICD-10-CM | POA: Diagnosis not present

## 2019-02-23 DIAGNOSIS — L821 Other seborrheic keratosis: Secondary | ICD-10-CM | POA: Diagnosis not present

## 2019-02-23 DIAGNOSIS — L218 Other seborrheic dermatitis: Secondary | ICD-10-CM | POA: Diagnosis not present

## 2019-02-23 DIAGNOSIS — Z08 Encounter for follow-up examination after completed treatment for malignant neoplasm: Secondary | ICD-10-CM | POA: Diagnosis not present

## 2019-03-29 DIAGNOSIS — N402 Nodular prostate without lower urinary tract symptoms: Secondary | ICD-10-CM | POA: Diagnosis not present

## 2019-03-29 DIAGNOSIS — R972 Elevated prostate specific antigen [PSA]: Secondary | ICD-10-CM | POA: Diagnosis not present

## 2019-04-25 DIAGNOSIS — R69 Illness, unspecified: Secondary | ICD-10-CM | POA: Diagnosis not present

## 2019-07-20 ENCOUNTER — Other Ambulatory Visit: Payer: Self-pay

## 2019-07-20 ENCOUNTER — Ambulatory Visit (INDEPENDENT_AMBULATORY_CARE_PROVIDER_SITE_OTHER): Payer: Medicare HMO | Admitting: Family Medicine

## 2019-07-20 ENCOUNTER — Encounter: Payer: Self-pay | Admitting: Family Medicine

## 2019-07-20 VITALS — BP 120/68 | HR 77 | Temp 97.8°F | Resp 18 | Wt 142.4 lb

## 2019-07-20 DIAGNOSIS — I10 Essential (primary) hypertension: Secondary | ICD-10-CM | POA: Diagnosis not present

## 2019-07-20 DIAGNOSIS — R972 Elevated prostate specific antigen [PSA]: Secondary | ICD-10-CM | POA: Diagnosis not present

## 2019-07-20 DIAGNOSIS — E785 Hyperlipidemia, unspecified: Secondary | ICD-10-CM | POA: Diagnosis not present

## 2019-07-20 DIAGNOSIS — R739 Hyperglycemia, unspecified: Secondary | ICD-10-CM

## 2019-07-20 LAB — CBC
HCT: 42.4 % (ref 39.0–52.0)
Hemoglobin: 14.1 g/dL (ref 13.0–17.0)
MCHC: 33.3 g/dL (ref 30.0–36.0)
MCV: 94.1 fl (ref 78.0–100.0)
Platelets: 196 10*3/uL (ref 150.0–400.0)
RBC: 4.51 Mil/uL (ref 4.22–5.81)
RDW: 13.1 % (ref 11.5–15.5)
WBC: 6.9 10*3/uL (ref 4.0–10.5)

## 2019-07-20 LAB — COMPREHENSIVE METABOLIC PANEL
ALT: 21 U/L (ref 0–53)
AST: 26 U/L (ref 0–37)
Albumin: 4.6 g/dL (ref 3.5–5.2)
Alkaline Phosphatase: 48 U/L (ref 39–117)
BUN: 26 mg/dL — ABNORMAL HIGH (ref 6–23)
CO2: 28 mEq/L (ref 19–32)
Calcium: 9.5 mg/dL (ref 8.4–10.5)
Chloride: 104 mEq/L (ref 96–112)
Creatinine, Ser: 0.73 mg/dL (ref 0.40–1.50)
GFR: 103.25 mL/min (ref >60.00)
Glucose, Bld: 90 mg/dL (ref 70–99)
Potassium: 4.3 mEq/L (ref 3.5–5.1)
Sodium: 140 mEq/L (ref 135–145)
Total Bilirubin: 1.1 mg/dL (ref 0.2–1.2)
Total Protein: 6.7 g/dL (ref 6.0–8.3)

## 2019-07-20 LAB — LIPID PANEL
Cholesterol: 182 mg/dL (ref 0–200)
HDL: 47.5 mg/dL (ref >39.00)
LDL Cholesterol: 114 mg/dL — ABNORMAL HIGH (ref 0–99)
NonHDL: 134.53
Total CHOL/HDL Ratio: 4
Triglycerides: 101 mg/dL (ref 0.0–149.0)
VLDL: 20.2 mg/dL (ref 0.0–40.0)

## 2019-07-20 LAB — TSH: TSH: 1.41 u[IU]/mL (ref 0.35–4.50)

## 2019-07-20 NOTE — Assessment & Plan Note (Signed)
Well controlled, no changes to meds. Encouraged heart healthy diet such as the DASH diet and exercise as tolerated.  °

## 2019-07-20 NOTE — Progress Notes (Signed)
Subjective:    Patient ID: Troy Mccann, male    DOB: 01/12/1941, 78 y.o.   MRN: [id number]  No chief complaint on file.   HPI Patient is in today for follow up on chronic medical concerns including hypertension, hyperlipidemia, elevated PSA and more. He feels well today. No recent febrile illness or hospitalizations. Notes some nocturia but no increase in polyuria or dysuria. Is following with [facility] now for his elevated PSA and they are keeping a close eye on his labs. Denies CP/palp/SOB/HA/congestion/fevers/GI or GU c/o. Taking meds as prescribed  Past Medical History:  Diagnosis Date  . Abnormal PSA 01/12/2017  . BCC (basal cell carcinoma), face 01/12/2017  . BPH (benign prostatic hyperplasia) 04/15/2014  . Cataract 07/09/2015  . Hematuria 07/09/2015  . Medicare annual wellness visit, subsequent 10/21/2014  . Other and unspecified hyperlipidemia 04/15/2014  . SCC (squamous cell carcinoma) 10/21/2014  . Tick bite 2007   Never assessed by a physician.  Marland Kitchen Unspecified constipation 03/08/2013    No past surgical history on file.  Family History  Problem Relation Age of Onset  . Arthritis Mother        rheumatoid  . Heart disease Mother        rheumatic fever  . Hypertension Mother   . Heart disease Father        MI  . Dementia Father   . Hypertension Sister   . Hypertension Son   . Obesity Son   . Aneurysm Maternal Grandmother   . Dementia Maternal Grandfather   . Heart disease Paternal Grandmother        MI  . Kidney disease Paternal Grandfather        bright's disease    Social History   Socioeconomic History  . Marital status: Widowed    Spouse name: Not on file  . Number of children: Not on file  . Years of education: Not on file  . Highest education level: Not on file  Occupational History  . Not on file  Social Needs  . Financial resource strain: Not on file  . Food insecurity    Worry: Not on file    Inability: Not on file  . Transportation needs     Medical: Not on file    Non-medical: Not on file  Tobacco Use  . Smoking status: Former Research scientist (life sciences)  . Smokeless tobacco: Never Used  Substance and Sexual Activity  . Alcohol use: Not Currently  . Drug use: No  . Sexual activity: Not on file  Lifestyle  . Physical activity    Days per week: Not on file    Minutes per session: Not on file  . Stress: Not on file  Relationships  . Social Herbalist on phone: Not on file    Gets together: Not on file    Attends religious service: Not on file    Active member of club or organization: Not on file    Attends meetings of clubs or organizations: Not on file    Relationship status: Not on file  . Intimate partner violence    Fear of current or ex partner: Not on file    Emotionally abused: Not on file    Physically abused: Not on file    Forced sexual activity: Not on file  Other Topics Concern  . Not on file  Social History Narrative  . Not on file    Outpatient Medications Prior to Visit  Medication Sig  Dispense Refill  . Ascorbic Acid (VITAMIN C) POWD Take 333 mg by mouth daily. Emergen-C-take 1/3 packet daily.    . B Complex-C (SUPER B COMPLEX PO) Take by mouth daily.    . Camphor-Menthol-Methyl Sal (TIGER BALM MUSCLE RUB EX) Apply topically at bedtime.    . Cholecalciferol (VITAMIN D-3) 1000 UNITS CAPS Take by mouth daily.    Marland Kitchen ibuprofen (ADVIL,MOTRIN) 200 MG tablet Take 200 mg by mouth every 6 (six) hours as needed.     No facility-administered medications prior to visit.     Allergies  Allergen Reactions  . [place] Venom [Bee Venom] Hives    Review of Systems  Constitutional: Negative for fever and malaise/fatigue.  HENT: Negative for congestion.   Eyes: Negative for blurred vision.  Respiratory: Negative for shortness of breath.   Cardiovascular: Negative for chest pain, palpitations and leg swelling.  Gastrointestinal: Negative for abdominal pain, blood in stool and nausea.  Genitourinary: Negative  for dysuria and frequency.  Musculoskeletal: Negative for falls.  Skin: Negative for rash.  Neurological: Negative for dizziness, loss of consciousness and headaches.  Endo/Heme/Allergies: Negative for environmental allergies.  Psychiatric/Behavioral: Negative for depression. The patient is not nervous/anxious.        Objective:    Physical Exam Vitals signs and nursing note reviewed.  Constitutional:      General: He is not in acute distress.    Appearance: He is well-developed.  HENT:     Head: Normocephalic and atraumatic.     Nose: Nose normal.  Eyes:     General:        Right eye: No discharge.        Left eye: No discharge.  Neck:     Musculoskeletal: Normal range of motion and neck supple.  Cardiovascular:     Rate and Rhythm: Normal rate and regular rhythm.     Heart sounds: No murmur.  Pulmonary:     Effort: Pulmonary effort is normal.     Breath sounds: Normal breath sounds.  Abdominal:     General: Bowel sounds are normal.     Palpations: Abdomen is soft.     Tenderness: There is no abdominal tenderness.  Skin:    General: Skin is warm and dry.  Neurological:     Mental Status: He is alert and oriented to person, place, and time.     BP 120/68 (BP Location: Left Arm, Patient Position: Sitting, Cuff Size: Normal)   Pulse 77   Temp 97.8 F (36.6 C) (Oral)   Resp 18   Wt 142 lb 6.4 oz (64.6 kg)   SpO2 93%   BMI 24.44 kg/m  Wt Readings from Last 3 Encounters:  07/20/19 142 lb 6.4 oz (64.6 kg)  01/19/19 150 lb (68 kg)  07/14/18 143 lb 6.4 oz (65 kg)    Diabetic Foot Exam - Simple   No data filed     Lab Results  Component Value Date   WBC 5.4 01/19/2019   HGB 14.2 01/19/2019   HCT 42.7 01/19/2019   PLT 207.0 01/19/2019   GLUCOSE 87 01/19/2019   CHOL 159 01/19/2019   TRIG 89.0 01/19/2019   HDL 45.10 01/19/2019   LDLCALC 96 01/19/2019   ALT 24 01/19/2019   AST 31 01/19/2019   NA 140 01/19/2019   K 4.0 01/19/2019   CL 105 01/19/2019    CREATININE 0.71 01/19/2019   BUN 16 01/19/2019   CO2 28 01/19/2019   TSH 1.83 01/19/2019  PSA 10.02 (H) 01/19/2019   HGBA1C 5.4 07/14/2018    Lab Results  Component Value Date   TSH 1.83 01/19/2019   Lab Results  Component Value Date   WBC 5.4 01/19/2019   HGB 14.2 01/19/2019   HCT 42.7 01/19/2019   MCV 93.2 01/19/2019   PLT 207.0 01/19/2019   Lab Results  Component Value Date   NA 140 01/19/2019   K 4.0 01/19/2019   CO2 28 01/19/2019   GLUCOSE 87 01/19/2019   BUN 16 01/19/2019   CREATININE 0.71 01/19/2019   BILITOT 1.4 (H) 01/19/2019   ALKPHOS 53 01/19/2019   AST 31 01/19/2019   ALT 24 01/19/2019   PROT 6.7 01/19/2019   ALBUMIN 4.5 01/19/2019   CALCIUM 9.3 01/19/2019   GFR 106.75 01/19/2019   Lab Results  Component Value Date   CHOL 159 01/19/2019   Lab Results  Component Value Date   HDL 45.10 01/19/2019   Lab Results  Component Value Date   LDLCALC 96 01/19/2019   Lab Results  Component Value Date   TRIG 89.0 01/19/2019   Lab Results  Component Value Date   CHOLHDL 4 01/19/2019   Lab Results  Component Value Date   HGBA1C 5.4 07/14/2018       Assessment & Plan:   Problem List Items Addressed This Visit    HTN (hypertension) - Primary    Well controlled, no changes to meds. Encouraged heart healthy diet such as the DASH diet and exercise as tolerated.       Relevant Orders   CBC   Comprehensive metabolic panel   TSH   Hyperlipidemia    Encouraged heart healthy diet, increase exercise, avoid trans fats and simple carbs      Relevant Orders   Lipid panel   Abnormal PSA    Is following with [facility], Dr Alinda Money now and they are watching closely, repeating labs every 6 months.       Hyperglycemia    hgba1c acceptable, minimize simple carbs. Increase exercise as tolerated.          I am having Troy Mccann maintain his Vitamin D-3, Camphor-Menthol-Methyl Sal (TIGER BALM MUSCLE RUB EX), Vitamin C, B Complex-C (SUPER B  COMPLEX PO), and ibuprofen.  No orders of the defined types were placed in this encounter.    Penni Homans, MD

## 2019-07-20 NOTE — Assessment & Plan Note (Signed)
Encouraged heart healthy diet, increase exercise, avoid trans fats and simple carbs.  

## 2019-07-20 NOTE — Assessment & Plan Note (Signed)
Is following with [facility], Dr Alinda Money now and they are watching closely, repeating labs every 6 months.

## 2019-07-20 NOTE — Assessment & Plan Note (Signed)
hgba1c acceptable, minimize simple carbs. Increase exercise as tolerated.  

## 2019-07-20 NOTE — Patient Instructions (Signed)
Pulse Oximeter   Check vitals weekly Hypertension, Adult High blood pressure (hypertension) is when the force of blood pumping through the arteries is too strong. The arteries are the blood vessels that carry blood from the heart throughout the body. Hypertension forces the heart to work harder to pump blood and may cause arteries to become narrow or stiff. Untreated or uncontrolled hypertension can cause a heart attack, heart failure, a stroke, kidney disease, and other problems. A blood pressure reading consists of a higher number over a lower number. Ideally, your blood pressure should be below 120/80. The first ("top") number is called the systolic pressure. It is a measure of the pressure in your arteries as your heart beats. The second ("bottom") number is called the diastolic pressure. It is a measure of the pressure in your arteries as the heart relaxes. What are the causes? The exact cause of this condition is not known. There are some conditions that result in or are related to high blood pressure. What increases the risk? Some risk factors for high blood pressure are under your control. The following factors may make you more likely to develop this condition:  Smoking.  Having type 2 diabetes mellitus, high cholesterol, or both.  Not getting enough exercise or physical activity.  Being overweight.  Having too much fat, sugar, calories, or salt (sodium) in your diet.  Drinking too much alcohol. Some risk factors for high blood pressure may be difficult or impossible to change. Some of these factors include:  Having chronic kidney disease.  Having a family history of high blood pressure.  Age. Risk increases with age.  Race. You may be at higher risk if you are African American.  Gender. Men are at higher risk than women before age 17. After age 70, women are at higher risk than men.  Having obstructive sleep apnea.  Stress. What are the signs or symptoms? High blood  pressure may not cause symptoms. Very high blood pressure (hypertensive crisis) may cause:  Headache.  Anxiety.  Shortness of breath.  Nosebleed.  Nausea and vomiting.  Vision changes.  Severe chest pain.  Seizures. How is this diagnosed? This condition is diagnosed by measuring your blood pressure while you are seated, with your arm resting on a flat surface, your legs uncrossed, and your feet flat on the floor. The cuff of the blood pressure monitor will be placed directly against the skin of your upper arm at the level of your heart. It should be measured at least twice using the same arm. Certain conditions can cause a difference in blood pressure between your right and left arms. Certain factors can cause blood pressure readings to be lower or higher than normal for a short period of time:  When your blood pressure is higher when you are in a health care provider's office than when you are at home, this is called white coat hypertension. Most people with this condition do not need medicines.  When your blood pressure is higher at home than when you are in a health care provider's office, this is called masked hypertension. Most people with this condition may need medicines to control blood pressure. If you have a high blood pressure reading during one visit or you have normal blood pressure with other risk factors, you may be asked to:  Return on a different day to have your blood pressure checked again.  Monitor your blood pressure at home for 1 week or longer. If you are diagnosed with  hypertension, you may have other blood or imaging tests to help your health care provider understand your overall risk for other conditions. How is this treated? This condition is treated by making healthy lifestyle changes, such as eating healthy foods, exercising more, and reducing your alcohol intake. Your health care provider may prescribe medicine if lifestyle changes are not enough to get  your blood pressure under control, and if:  Your systolic blood pressure is above 130.  Your diastolic blood pressure is above 80. Your personal target blood pressure may vary depending on your medical conditions, your age, and other factors. Follow these instructions at home: Eating and drinking   Eat a diet that is high in fiber and potassium, and low in sodium, added sugar, and fat. An example eating plan is called the DASH (Dietary Approaches to Stop Hypertension) diet. To eat this way: ? Eat plenty of fresh fruits and vegetables. Try to fill one half of your plate at each meal with fruits and vegetables. ? Eat whole grains, such as whole-wheat pasta, brown rice, or whole-grain bread. Fill about one fourth of your plate with whole grains. ? Eat or drink low-fat dairy products, such as skim milk or low-fat yogurt. ? Avoid fatty cuts of meat, processed or cured meats, and poultry with skin. Fill about one fourth of your plate with lean proteins, such as fish, chicken without skin, beans, eggs, or tofu. ? Avoid pre-made and processed foods. These tend to be higher in sodium, added sugar, and fat.  Reduce your daily sodium intake. Most people with hypertension should eat less than 1,500 mg of sodium a day.  Do not drink alcohol if: ? Your health care provider tells you not to drink. ? You are pregnant, may be pregnant, or are planning to become pregnant.  If you drink alcohol: ? Limit how much you use to:  0-1 drink a day for women.  0-2 drinks a day for men. ? Be aware of how much alcohol is in your drink. In the [place] one drink equals one 12 oz bottle of beer (355 mL), one 5 oz glass of wine (148 mL), or one 1 oz glass of hard liquor (44 mL). Lifestyle   Work with your health care provider to maintain a healthy body weight or to lose weight. Ask what an ideal weight is for you.  Get at least 30 minutes of exercise most days of the week. Activities may include walking, swimming,  or biking.  Include exercise to strengthen your muscles (resistance exercise), such as Pilates or lifting weights, as part of your weekly exercise routine. Try to do these types of exercises for 30 minutes at least 3 days a week.  Do not use any products that contain nicotine or tobacco, such as cigarettes, e-cigarettes, and chewing tobacco. If you need help quitting, ask your health care provider.  Monitor your blood pressure at home as told by your health care provider.  Keep all follow-up visits as told by your health care provider. This is important. Medicines  Take over-the-counter and prescription medicines only as told by your health care provider. Follow directions carefully. Blood pressure medicines must be taken as prescribed.  Do not skip doses of blood pressure medicine. Doing this puts you at risk for problems and can make the medicine less effective.  Ask your health care provider about side effects or reactions to medicines that you should watch for. Contact a health care provider if you:  Think you are  having a reaction to a medicine you are taking.  Have headaches that keep coming back (recurring).  Feel dizzy.  Have swelling in your ankles.  Have trouble with your vision. Get help right away if you:  Develop a severe headache or confusion.  Have unusual weakness or numbness.  Feel faint.  Have severe pain in your chest or abdomen.  Vomit repeatedly.  Have trouble breathing. Summary  Hypertension is when the force of blood pumping through your arteries is too strong. If this condition is not controlled, it may put you at risk for serious complications.  Your personal target blood pressure may vary depending on your medical conditions, your age, and other factors. For most people, a normal blood pressure is less than 120/80.  Hypertension is treated with lifestyle changes, medicines, or a combination of both. Lifestyle changes include losing weight,  eating a healthy, low-sodium diet, exercising more, and limiting alcohol. This information is not intended to replace advice given to you by your health care provider. Make sure you discuss any questions you have with your health care provider. Document Released: 11/23/2005 Document Revised: 08/03/2018 Document Reviewed: 08/03/2018 Elsevier Patient Education  2020 [employer].

## 2019-08-31 DIAGNOSIS — L218 Other seborrheic dermatitis: Secondary | ICD-10-CM | POA: Diagnosis not present

## 2019-08-31 DIAGNOSIS — Z08 Encounter for follow-up examination after completed treatment for malignant neoplasm: Secondary | ICD-10-CM | POA: Diagnosis not present

## 2019-08-31 DIAGNOSIS — Z85828 Personal history of other malignant neoplasm of skin: Secondary | ICD-10-CM | POA: Diagnosis not present

## 2019-08-31 DIAGNOSIS — L570 Actinic keratosis: Secondary | ICD-10-CM | POA: Diagnosis not present

## 2019-08-31 DIAGNOSIS — D1801 Hemangioma of skin and subcutaneous tissue: Secondary | ICD-10-CM | POA: Diagnosis not present

## 2019-08-31 DIAGNOSIS — L821 Other seborrheic keratosis: Secondary | ICD-10-CM | POA: Diagnosis not present

## 2019-08-31 DIAGNOSIS — L820 Inflamed seborrheic keratosis: Secondary | ICD-10-CM | POA: Diagnosis not present

## 2019-10-25 DIAGNOSIS — R972 Elevated prostate specific antigen [PSA]: Secondary | ICD-10-CM | POA: Diagnosis not present

## 2019-11-01 DIAGNOSIS — N402 Nodular prostate without lower urinary tract symptoms: Secondary | ICD-10-CM | POA: Diagnosis not present

## 2019-11-01 DIAGNOSIS — N401 Enlarged prostate with lower urinary tract symptoms: Secondary | ICD-10-CM | POA: Diagnosis not present

## 2019-11-01 DIAGNOSIS — R3912 Poor urinary stream: Secondary | ICD-10-CM | POA: Diagnosis not present

## 2019-11-01 DIAGNOSIS — R972 Elevated prostate specific antigen [PSA]: Secondary | ICD-10-CM | POA: Diagnosis not present

## 2019-12-26 DIAGNOSIS — R69 Illness, unspecified: Secondary | ICD-10-CM | POA: Diagnosis not present

## 2020-01-18 ENCOUNTER — Other Ambulatory Visit: Payer: Self-pay

## 2020-01-18 ENCOUNTER — Encounter: Payer: Self-pay | Admitting: Family Medicine

## 2020-01-18 ENCOUNTER — Ambulatory Visit (INDEPENDENT_AMBULATORY_CARE_PROVIDER_SITE_OTHER): Payer: Medicare HMO | Admitting: Family Medicine

## 2020-01-18 VITALS — BP 131/68 | HR 80 | Temp 96.4°F | Resp 16 | Ht 66.0 in | Wt 145.0 lb

## 2020-01-18 DIAGNOSIS — I10 Essential (primary) hypertension: Secondary | ICD-10-CM

## 2020-01-18 DIAGNOSIS — M2550 Pain in unspecified joint: Secondary | ICD-10-CM

## 2020-01-18 DIAGNOSIS — R739 Hyperglycemia, unspecified: Secondary | ICD-10-CM | POA: Diagnosis not present

## 2020-01-18 DIAGNOSIS — R972 Elevated prostate specific antigen [PSA]: Secondary | ICD-10-CM

## 2020-01-18 DIAGNOSIS — E785 Hyperlipidemia, unspecified: Secondary | ICD-10-CM | POA: Diagnosis not present

## 2020-01-18 LAB — CBC
HCT: 42 % (ref 39.0–52.0)
Hemoglobin: 13.9 g/dL (ref 13.0–17.0)
MCHC: 33.1 g/dL (ref 30.0–36.0)
MCV: 93.8 fl (ref 78.0–100.0)
Platelets: 191 10*3/uL (ref 150.0–400.0)
RBC: 4.48 Mil/uL (ref 4.22–5.81)
RDW: 13.1 % (ref 11.5–15.5)
WBC: 6.2 10*3/uL (ref 4.0–10.5)

## 2020-01-18 LAB — LIPID PANEL
Cholesterol: 165 mg/dL (ref 0–200)
HDL: 46.3 mg/dL (ref >39.00)
LDL Cholesterol: 100 mg/dL — ABNORMAL HIGH (ref 0–99)
NonHDL: 118.44
Total CHOL/HDL Ratio: 4
Triglycerides: 90 mg/dL (ref 0.0–149.0)
VLDL: 18 mg/dL (ref 0.0–40.0)

## 2020-01-18 LAB — COMPREHENSIVE METABOLIC PANEL
ALT: 29 U/L (ref 0–53)
AST: 35 U/L (ref 0–37)
Albumin: 4.4 g/dL (ref 3.5–5.2)
Alkaline Phosphatase: 50 U/L (ref 39–117)
BUN: 21 mg/dL (ref 6–23)
CO2: 29 mEq/L (ref 19–32)
Calcium: 9.4 mg/dL (ref 8.4–10.5)
Chloride: 104 mEq/L (ref 96–112)
Creatinine, Ser: 0.71 mg/dL (ref 0.40–1.50)
GFR: 106.48 mL/min (ref >60.00)
Glucose, Bld: 87 mg/dL (ref 70–99)
Potassium: 3.9 mEq/L (ref 3.5–5.1)
Sodium: 138 mEq/L (ref 135–145)
Total Bilirubin: 1 mg/dL (ref 0.2–1.2)
Total Protein: 6.7 g/dL (ref 6.0–8.3)

## 2020-01-18 LAB — TSH: TSH: 1.8 u[IU]/mL (ref 0.35–4.50)

## 2020-01-18 LAB — HEMOGLOBIN A1C: Hgb A1c MFr Bld: 5.6 % (ref 4.6–6.5)

## 2020-01-18 NOTE — Assessment & Plan Note (Signed)
He continues to follow with [facility] and has an appointment soon with Dr Karie Georges for a prostate biopsy to further evaluate

## 2020-01-18 NOTE — Assessment & Plan Note (Signed)
Well controlled, no changes to meds. Encouraged heart healthy diet such as the DASH diet and exercise as tolerated.  °

## 2020-01-18 NOTE — Patient Instructions (Addendum)
[facility] Blood Pressure cuff, upper arm, want BP 100-140/60-90 Pulse oximeter, want oxygen in 90s  Weekly vitals  Take Multivitamin with minerals, selenium Vitamin D 1000-2000 IU daily Probiotic with lactobacillus and bifidophilus Asprin EC 81 mg daily  Melatonin 2-5 mg at bedtime  [email or web address] 

## 2020-01-18 NOTE — Assessment & Plan Note (Signed)
Encouraged heart healthy diet, increase exercise, avoid trans fats, consider a krill oil cap daily 

## 2020-01-18 NOTE — Progress Notes (Signed)
Subjective:    Patient ID: Troy Mccann, male    DOB: Nov 11, 1941, 79 y.o.   MRN: [id number]  Chief Complaint  Patient presents with  . Hypertension    Follow up    HPI Patient is in today for follow up on chronic medical concerns. No recent febrile illness or hospitalizations. He has been maintaining quarantine well. No acute concerns. He continues to stay active and eat well. Denies CP/palp/SOB/HA/congestion/fevers/GI or GU c/o. Taking meds as prescribed. He notes increased pain and stiffness in hands and low back but no recent trauma or injury. Has been seen by [facility].   Past Medical History:  Diagnosis Date  . Abnormal PSA 01/12/2017  . BCC (basal cell carcinoma), face 01/12/2017  . BPH (benign prostatic hyperplasia) 04/15/2014  . Cataract 07/09/2015  . Hematuria 07/09/2015  . Medicare annual wellness visit, subsequent 10/21/2014  . Other and unspecified hyperlipidemia 04/15/2014  . SCC (squamous cell carcinoma) 10/21/2014  . Tick bite 2007   Never assessed by a physician.  Marland Kitchen Unspecified constipation 03/08/2013    No past surgical history on file.  Family History  Problem Relation Age of Onset  . Arthritis Mother        rheumatoid  . Heart disease Mother        rheumatic fever  . Hypertension Mother   . Heart disease Father        MI  . Dementia Father   . Hypertension Sister   . Hypertension Son   . Obesity Son   . Aneurysm Maternal Grandmother   . Dementia Maternal Grandfather   . Heart disease Paternal Grandmother        MI  . Kidney disease Paternal Grandfather        bright's disease    Social History   Socioeconomic History  . Marital status: Widowed    Spouse name: Not on file  . Number of children: Not on file  . Years of education: Not on file  . Highest education level: Not on file  Occupational History  . Not on file  Tobacco Use  . Smoking status: Former Research scientist (life sciences)  . Smokeless tobacco: Never Used  Substance and Sexual Activity  .  Alcohol use: Not Currently  . Drug use: No  . Sexual activity: Not on file  Other Topics Concern  . Not on file  Social History Narrative  . Not on file   Social Determinants of Health   Financial Resource Strain:   . Difficulty of Paying Living Expenses: Not on file  Food Insecurity:   . Worried About Charity fundraiser in the Last Year: Not on file  . Ran Out of Food in the Last Year: Not on file  Transportation Needs:   . Lack of Transportation (Medical): Not on file  . Lack of Transportation (Non-Medical): Not on file  Physical Activity:   . Days of Exercise per Week: Not on file  . Minutes of Exercise per Session: Not on file  Stress:   . Feeling of Stress : Not on file  Social Connections:   . Frequency of Communication with Friends and Family: Not on file  . Frequency of Social Gatherings with Friends and Family: Not on file  . Attends Religious Services: Not on file  . Active Member of Clubs or Organizations: Not on file  . Attends Archivist Meetings: Not on file  . Marital Status: Not on file  Intimate Partner Violence:   .  Fear of Current or Ex-Partner: Not on file  . Emotionally Abused: Not on file  . Physically Abused: Not on file  . Sexually Abused: Not on file    Outpatient Medications Prior to Visit  Medication Sig Dispense Refill  . Ascorbic Acid (VITAMIN C) POWD Take 333 mg by mouth daily. Emergen-C-take 1/3 packet daily.    . B Complex-C (SUPER B COMPLEX PO) Take by mouth daily.    . Camphor-Menthol-Methyl Sal (TIGER BALM MUSCLE RUB EX) Apply topically at bedtime.    . Cholecalciferol (VITAMIN D-3) 1000 UNITS CAPS Take by mouth daily.    Marland Kitchen ibuprofen (ADVIL,MOTRIN) 200 MG tablet Take 200 mg by mouth every 6 (six) hours as needed.     No facility-administered medications prior to visit.    Allergies  Allergen Reactions  . Yellow Jacket Venom [Bee Venom] Hives    Review of Systems  Constitutional: Negative for fever and malaise/fatigue.   HENT: Negative for congestion.   Eyes: Negative for blurred vision.  Respiratory: Negative for shortness of breath.   Cardiovascular: Negative for chest pain, palpitations and leg swelling.  Gastrointestinal: Negative for abdominal pain, blood in stool and nausea.  Genitourinary: Negative for dysuria and frequency.  Musculoskeletal: Positive for back pain and joint pain. Negative for falls.  Skin: Negative for rash.  Neurological: Negative for dizziness, loss of consciousness and headaches.  Endo/Heme/Allergies: Negative for environmental allergies.  Psychiatric/Behavioral: Negative for depression. The patient is not nervous/anxious.        Objective:    Physical Exam Vitals and nursing note reviewed.  Constitutional:      General: He is not in acute distress.    Appearance: He is well-developed.  HENT:     Head: Normocephalic and atraumatic.     Nose: Nose normal.  Eyes:     General:        Right eye: No discharge.        Left eye: No discharge.  Cardiovascular:     Rate and Rhythm: Normal rate and regular rhythm.     Heart sounds: No murmur.  Pulmonary:     Effort: Pulmonary effort is normal.     Breath sounds: Normal breath sounds.  Abdominal:     General: Bowel sounds are normal.     Palpations: Abdomen is soft.     Tenderness: There is no abdominal tenderness.  Musculoskeletal:        General: Deformity present.     Cervical back: Normal range of motion and neck supple.     Comments: Hypertrophied DIP and PIP joints in thumbs and second fingers. No redness or warmth  Skin:    General: Skin is warm and dry.  Neurological:     Mental Status: He is alert and oriented to person, place, and time.     BP 131/68 (BP Location: Right Arm, Patient Position: Sitting, Cuff Size: Small)   Pulse 80   Temp (!) 96.4 F (35.8 C) (Temporal)   Resp 16   Ht 5\' 6"  (1.676 m)   Wt 145 lb (65.8 kg)   SpO2 97%   BMI 23.40 kg/m  Wt Readings from Last 3 Encounters:  01/18/20  145 lb (65.8 kg)  07/20/19 142 lb 6.4 oz (64.6 kg)  01/19/19 150 lb (68 kg)    Diabetic Foot Exam - Simple   No data filed     Lab Results  Component Value Date   WBC 6.9 07/20/2019   HGB 14.1 07/20/2019  HCT 42.4 07/20/2019   PLT 196.0 07/20/2019   GLUCOSE 90 07/20/2019   CHOL 182 07/20/2019   TRIG 101.0 07/20/2019   HDL 47.50 07/20/2019   LDLCALC 114 (H) 07/20/2019   ALT 21 07/20/2019   AST 26 07/20/2019   NA 140 07/20/2019   K 4.3 07/20/2019   CL 104 07/20/2019   CREATININE 0.73 07/20/2019   BUN 26 (H) 07/20/2019   CO2 28 07/20/2019   TSH 1.41 07/20/2019   PSA 10.02 (H) 01/19/2019   HGBA1C 5.4 07/14/2018    Lab Results  Component Value Date   TSH 1.41 07/20/2019   Lab Results  Component Value Date   WBC 6.9 07/20/2019   HGB 14.1 07/20/2019   HCT 42.4 07/20/2019   MCV 94.1 07/20/2019   PLT 196.0 07/20/2019   Lab Results  Component Value Date   NA 140 07/20/2019   K 4.3 07/20/2019   CO2 28 07/20/2019   GLUCOSE 90 07/20/2019   BUN 26 (H) 07/20/2019   CREATININE 0.73 07/20/2019   BILITOT 1.1 07/20/2019   ALKPHOS 48 07/20/2019   AST 26 07/20/2019   ALT 21 07/20/2019   PROT 6.7 07/20/2019   ALBUMIN 4.6 07/20/2019   CALCIUM 9.5 07/20/2019   GFR 103.25 07/20/2019   Lab Results  Component Value Date   CHOL 182 07/20/2019   Lab Results  Component Value Date   HDL 47.50 07/20/2019   Lab Results  Component Value Date   LDLCALC 114 (H) 07/20/2019   Lab Results  Component Value Date   TRIG 101.0 07/20/2019   Lab Results  Component Value Date   CHOLHDL 4 07/20/2019   Lab Results  Component Value Date   HGBA1C 5.4 07/14/2018       Assessment & Plan:   Problem List Items Addressed This Visit    Arthralgia    Noting increased pain, stiffness, hypertrophied joints in bilateral hands and also ongoing trouble with low back pain and sciatic pain down legs. Check RF and ANA and encouraged to continue to stay as active as possible. Report  worsening      Relevant Orders   Rheumatoid Factor   ANA   HTN (hypertension) - Primary    Well controlled, no changes to meds. Encouraged heart healthy diet such as the DASH diet and exercise as tolerated.       Relevant Orders   CBC   Comprehensive metabolic panel   TSH   Hyperlipidemia    Encouraged heart healthy diet, increase exercise, avoid trans fats, consider a krill oil cap daily      Relevant Orders   Lipid panel   Abnormal PSA    He continues to follow with [facility] and has an appointment soon with Dr Karie Georges for a prostate biopsy to further evaluate      Hyperglycemia    hgba1c acceptable, minimize simple carbs. Increase exercise as tolerated.       Relevant Orders   Hemoglobin A1c      I am having Auryn T. Baumler maintain his Vitamin D-3, Camphor-Menthol-Methyl Sal (TIGER BALM MUSCLE RUB EX), Vitamin C, B Complex-C (SUPER B COMPLEX PO), and ibuprofen.  No orders of the defined types were placed in this encounter.    Penni Homans, MD

## 2020-01-18 NOTE — Assessment & Plan Note (Signed)
Noting increased pain, stiffness, hypertrophied joints in bilateral hands and also ongoing trouble with low back pain and sciatic pain down legs. Check RF and ANA and encouraged to continue to stay as active as possible. Report worsening

## 2020-01-18 NOTE — Assessment & Plan Note (Signed)
hgba1c acceptable, minimize simple carbs. Increase exercise as tolerated.  

## 2020-01-21 LAB — RHEUMATOID FACTOR: Rhuematoid fact SerPl-aCnc: <14 IU/mL (ref <14)

## 2020-01-21 LAB — ANTI-NUCLEAR AB-TITER (ANA TITER): ANA Titer 1: 1:40 {titer} — ABNORMAL HIGH

## 2020-01-21 LAB — ANA: Anti Nuclear Antibody (ANA): POSITIVE — AB

## 2020-01-31 DIAGNOSIS — R972 Elevated prostate specific antigen [PSA]: Secondary | ICD-10-CM | POA: Diagnosis not present

## 2020-02-07 DIAGNOSIS — N401 Enlarged prostate with lower urinary tract symptoms: Secondary | ICD-10-CM | POA: Diagnosis not present

## 2020-02-07 DIAGNOSIS — R3915 Urgency of urination: Secondary | ICD-10-CM | POA: Diagnosis not present

## 2020-02-07 DIAGNOSIS — R972 Elevated prostate specific antigen [PSA]: Secondary | ICD-10-CM | POA: Diagnosis not present

## 2020-02-07 DIAGNOSIS — N402 Nodular prostate without lower urinary tract symptoms: Secondary | ICD-10-CM | POA: Diagnosis not present

## 2020-03-19 DIAGNOSIS — D225 Melanocytic nevi of trunk: Secondary | ICD-10-CM | POA: Diagnosis not present

## 2020-03-19 DIAGNOSIS — D1801 Hemangioma of skin and subcutaneous tissue: Secondary | ICD-10-CM | POA: Diagnosis not present

## 2020-03-19 DIAGNOSIS — L570 Actinic keratosis: Secondary | ICD-10-CM | POA: Diagnosis not present

## 2020-03-19 DIAGNOSIS — L218 Other seborrheic dermatitis: Secondary | ICD-10-CM | POA: Diagnosis not present

## 2020-03-19 DIAGNOSIS — L821 Other seborrheic keratosis: Secondary | ICD-10-CM | POA: Diagnosis not present

## 2020-03-19 DIAGNOSIS — L820 Inflamed seborrheic keratosis: Secondary | ICD-10-CM | POA: Diagnosis not present

## 2020-05-09 DIAGNOSIS — N402 Nodular prostate without lower urinary tract symptoms: Secondary | ICD-10-CM | POA: Diagnosis not present

## 2020-07-23 ENCOUNTER — Encounter: Payer: Medicare HMO | Admitting: Family Medicine

## 2020-07-24 DIAGNOSIS — R69 Illness, unspecified: Secondary | ICD-10-CM | POA: Diagnosis not present

## 2020-08-28 DIAGNOSIS — N402 Nodular prostate without lower urinary tract symptoms: Secondary | ICD-10-CM | POA: Diagnosis not present

## 2020-08-28 LAB — PSA: PSA: 9.89

## 2020-09-04 DIAGNOSIS — R3912 Poor urinary stream: Secondary | ICD-10-CM | POA: Diagnosis not present

## 2020-09-04 DIAGNOSIS — R972 Elevated prostate specific antigen [PSA]: Secondary | ICD-10-CM | POA: Diagnosis not present

## 2020-09-04 DIAGNOSIS — N402 Nodular prostate without lower urinary tract symptoms: Secondary | ICD-10-CM | POA: Diagnosis not present

## 2020-09-04 DIAGNOSIS — N401 Enlarged prostate with lower urinary tract symptoms: Secondary | ICD-10-CM | POA: Diagnosis not present

## 2020-09-10 DIAGNOSIS — L720 Epidermal cyst: Secondary | ICD-10-CM | POA: Diagnosis not present

## 2020-09-10 DIAGNOSIS — X32XXXS Exposure to sunlight, sequela: Secondary | ICD-10-CM | POA: Diagnosis not present

## 2020-09-10 DIAGNOSIS — D044 Carcinoma in situ of skin of scalp and neck: Secondary | ICD-10-CM | POA: Diagnosis not present

## 2020-09-10 DIAGNOSIS — Z859 Personal history of malignant neoplasm, unspecified: Secondary | ICD-10-CM | POA: Diagnosis not present

## 2020-09-10 DIAGNOSIS — D1801 Hemangioma of skin and subcutaneous tissue: Secondary | ICD-10-CM | POA: Diagnosis not present

## 2020-09-10 DIAGNOSIS — L821 Other seborrheic keratosis: Secondary | ICD-10-CM | POA: Diagnosis not present

## 2020-09-10 DIAGNOSIS — L218 Other seborrheic dermatitis: Secondary | ICD-10-CM | POA: Diagnosis not present

## 2020-09-10 DIAGNOSIS — L814 Other melanin hyperpigmentation: Secondary | ICD-10-CM | POA: Diagnosis not present

## 2020-09-10 DIAGNOSIS — L570 Actinic keratosis: Secondary | ICD-10-CM | POA: Diagnosis not present

## 2020-09-10 DIAGNOSIS — D225 Melanocytic nevi of trunk: Secondary | ICD-10-CM | POA: Diagnosis not present

## 2020-11-25 ENCOUNTER — Encounter: Payer: Medicare HMO | Admitting: Family Medicine

## 2020-11-25 DIAGNOSIS — Z0289 Encounter for other administrative examinations: Secondary | ICD-10-CM

## 2020-12-02 ENCOUNTER — Ambulatory Visit (INDEPENDENT_AMBULATORY_CARE_PROVIDER_SITE_OTHER): Payer: Medicare HMO | Admitting: Family Medicine

## 2020-12-02 ENCOUNTER — Other Ambulatory Visit: Payer: Self-pay

## 2020-12-02 ENCOUNTER — Encounter: Payer: Self-pay | Admitting: Family Medicine

## 2020-12-02 VITALS — BP 140/64 | HR 74 | Temp 98.3°F | Resp 14 | Wt 140.0 lb

## 2020-12-02 DIAGNOSIS — R739 Hyperglycemia, unspecified: Secondary | ICD-10-CM

## 2020-12-02 DIAGNOSIS — I10 Essential (primary) hypertension: Secondary | ICD-10-CM

## 2020-12-02 DIAGNOSIS — E785 Hyperlipidemia, unspecified: Secondary | ICD-10-CM | POA: Diagnosis not present

## 2020-12-02 DIAGNOSIS — R972 Elevated prostate specific antigen [PSA]: Secondary | ICD-10-CM

## 2020-12-02 DIAGNOSIS — Z0000 Encounter for general adult medical examination without abnormal findings: Secondary | ICD-10-CM

## 2020-12-02 NOTE — Assessment & Plan Note (Signed)
Patient encouraged to maintain heart healthy diet, regular exercise, adequate sleep. Consider daily probiotics. Take medications as prescribed. Labs ordered and reviewed 

## 2020-12-02 NOTE — Progress Notes (Signed)
Subjective:    Patient ID: Troy Mccann, male    DOB: 1941/03/29, 79 y.o.   MRN: [id number]  Chief Complaint  Patient presents with  . Annual Exam    Would like to discuss autoimmune testing from last ov.    HPI Patient is in today for annual preventative exam and follow up on chronic medical concerns. No recent febrile illness or hospitalizations. He continues to exercise regularly and eats a heart healthy diet. His hands have flares in pain at times but he manages by staying active and using [employer] He has chosen not to get vaccinated but his family is. He wears masks out in public and is home most of the time. Denies CP/palp/SOB/HA/congestion/fevers/GI or GU c/o. Taking meds as prescribed  Past Medical History:  Diagnosis Date  . Abnormal PSA 01/12/2017  . BCC (basal cell carcinoma), face 01/12/2017  . BPH (benign prostatic hyperplasia) 04/15/2014  . Cataract 07/09/2015  . Hematuria 07/09/2015  . Medicare annual wellness visit, subsequent 10/21/2014  . Other and unspecified hyperlipidemia 04/15/2014  . SCC (squamous cell carcinoma) 10/21/2014  . Tick bite 2007   Never assessed by a physician.  Marland Kitchen Unspecified constipation 03/08/2013    No past surgical history on file.  Family History  Problem Relation Age of Onset  . Arthritis Mother        rheumatoid  . Heart disease Mother        rheumatic fever  . Hypertension Mother   . Heart disease Father        MI  . Dementia Father   . Hypertension Sister   . Dementia Brother   . Hypertension Son   . Obesity Son   . Aneurysm Maternal Grandmother   . Dementia Maternal Grandfather   . Heart disease Paternal Grandmother        MI  . Kidney disease Paternal Grandfather        bright's disease    Social History   Socioeconomic History  . Marital status: Widowed    Spouse name: Not on file  . Number of children: Not on file  . Years of education: Not on file  . Highest education level: Not on file  Occupational History  .  Not on file  Tobacco Use  . Smoking status: Former Research scientist (life sciences)  . Smokeless tobacco: Never Used  Vaping Use  . Vaping Use: Never used  Substance and Sexual Activity  . Alcohol use: Not Currently  . Drug use: No  . Sexual activity: Not on file  Other Topics Concern  . Not on file  Social History Narrative  . Not on file   Social Determinants of Health   Financial Resource Strain: Not on file  Food Insecurity: Not on file  Transportation Needs: Not on file  Physical Activity: Not on file  Stress: Not on file  Social Connections: Not on file  Intimate Partner Violence: Not on file    Outpatient Medications Prior to Visit  Medication Sig Dispense Refill  . Ascorbic Acid (VITAMIN C) POWD Take 333 mg by mouth daily. Emergen-C-take 1/3 packet daily.    . B Complex-C (SUPER B COMPLEX PO) Take by mouth daily.    . Camphor-Menthol-Methyl Sal (TIGER BALM MUSCLE RUB EX) Apply topically at bedtime.    . Cholecalciferol (VITAMIN D-3) 1000 UNITS CAPS Take by mouth daily.    Marland Kitchen ibuprofen (ADVIL,MOTRIN) 200 MG tablet Take 200 mg by mouth every 6 (six) hours as needed.  No facility-administered medications prior to visit.    Allergies  Allergen Reactions  . Yellow Jacket Venom [Bee Venom] Hives    Review of Systems  Constitutional: Negative for chills, fever and malaise/fatigue.  HENT: Negative for congestion and hearing loss.   Eyes: Negative for discharge.  Respiratory: Negative for cough, sputum production and shortness of breath.   Cardiovascular: Negative for chest pain, palpitations and leg swelling.  Gastrointestinal: Negative for abdominal pain, blood in stool, constipation, diarrhea, heartburn, nausea and vomiting.  Genitourinary: Negative for dysuria, frequency, hematuria and urgency.  Musculoskeletal: Positive for joint pain. Negative for back pain, falls and myalgias.  Skin: Negative for rash.  Neurological: Negative for dizziness, sensory change, loss of consciousness,  weakness and headaches.  Endo/Heme/Allergies: Negative for environmental allergies. Does not bruise/bleed easily.  Psychiatric/Behavioral: Negative for depression and suicidal ideas. The patient is not nervous/anxious and does not have insomnia.        Objective:    Physical Exam Vitals and nursing note reviewed.  Constitutional:      General: He is not in acute distress.    Appearance: He is well-developed and well-nourished.  HENT:     Head: Normocephalic and atraumatic.     Nose: Nose normal.  Eyes:     General:        Right eye: No discharge.        Left eye: No discharge.  Cardiovascular:     Rate and Rhythm: Normal rate and regular rhythm.     Heart sounds: No murmur heard.   Pulmonary:     Effort: Pulmonary effort is normal.     Breath sounds: Normal breath sounds.  Abdominal:     General: Bowel sounds are normal.     Palpations: Abdomen is soft.     Tenderness: There is no abdominal tenderness.  Musculoskeletal:        General: No edema.     Cervical back: Normal range of motion and neck supple.  Skin:    General: Skin is warm and dry.  Neurological:     Mental Status: He is alert and oriented to person, place, and time.  Psychiatric:        Mood and Affect: Mood and affect normal.     BP 140/64 (BP Location: Left Arm, Patient Position: Sitting, Cuff Size: Normal)   Pulse 74   Temp 98.3 F (36.8 C) (Oral)   Resp 14   Wt 140 lb (63.5 kg)   SpO2 95%   BMI 22.60 kg/m  Wt Readings from Last 3 Encounters:  12/02/20 140 lb (63.5 kg)  01/18/20 145 lb (65.8 kg)  07/20/19 142 lb 6.4 oz (64.6 kg)    Diabetic Foot Exam - Simple   No data filed    Lab Results  Component Value Date   WBC 6.2 01/18/2020   HGB 13.9 01/18/2020   HCT 42.0 01/18/2020   PLT 191.0 01/18/2020   GLUCOSE 87 01/18/2020   CHOL 165 01/18/2020   TRIG 90.0 01/18/2020   HDL 46.30 01/18/2020   LDLCALC 100 (H) 01/18/2020   ALT 29 01/18/2020   AST 35 01/18/2020   NA 138 01/18/2020    K 3.9 01/18/2020   CL 104 01/18/2020   CREATININE 0.71 01/18/2020   BUN 21 01/18/2020   CO2 29 01/18/2020   TSH 1.80 01/18/2020   PSA 9.89 08/28/2020   HGBA1C 5.6 01/18/2020    Lab Results  Component Value Date   TSH 1.80 01/18/2020  Lab Results  Component Value Date   WBC 6.2 01/18/2020   HGB 13.9 01/18/2020   HCT 42.0 01/18/2020   MCV 93.8 01/18/2020   PLT 191.0 01/18/2020   Lab Results  Component Value Date   NA 138 01/18/2020   K 3.9 01/18/2020   CO2 29 01/18/2020   GLUCOSE 87 01/18/2020   BUN 21 01/18/2020   CREATININE 0.71 01/18/2020   BILITOT 1.0 01/18/2020   ALKPHOS 50 01/18/2020   AST 35 01/18/2020   ALT 29 01/18/2020   PROT 6.7 01/18/2020   ALBUMIN 4.4 01/18/2020   CALCIUM 9.4 01/18/2020   GFR 106.48 01/18/2020   Lab Results  Component Value Date   CHOL 165 01/18/2020   Lab Results  Component Value Date   HDL 46.30 01/18/2020   Lab Results  Component Value Date   LDLCALC 100 (H) 01/18/2020   Lab Results  Component Value Date   TRIG 90.0 01/18/2020   Lab Results  Component Value Date   CHOLHDL 4 01/18/2020   Lab Results  Component Value Date   HGBA1C 5.6 01/18/2020       Assessment & Plan:   Problem List Items Addressed This Visit    HTN (hypertension)    Well controlled, no changes to meds. Encouraged heart healthy diet such as the DASH diet and exercise as tolerated.       Relevant Orders   Comprehensive metabolic panel   TSH   Hyperlipidemia    Encouraged heart healthy diet, increase exercise, avoid trans fats, consider a krill oil cap daily      Relevant Orders   Lipid panel   Preventative health care    Patient encouraged to maintain heart healthy diet, regular exercise, adequate sleep. Consider daily probiotics. Take medications as prescribed. Labs ordered and reviewed.       Abnormal PSA - Primary   Relevant Orders   CBC   Hyperglycemia    hgba1c acceptable, minimize simple carbs. Increase exercise as  tolerated.       Relevant Orders   Hemoglobin A1c   Comprehensive metabolic panel      I am having Fritz Pickerel T. Mcclusky maintain his Vitamin D-3, Camphor-Menthol-Methyl Sal (TIGER BALM MUSCLE RUB EX), Vitamin C, B Complex-C (SUPER B COMPLEX PO), and ibuprofen.  No orders of the defined types were placed in this encounter.    Penni Homans, MD

## 2020-12-02 NOTE — Assessment & Plan Note (Signed)
hgba1c acceptable, minimize simple carbs. Increase exercise as tolerated.  

## 2020-12-02 NOTE — Assessment & Plan Note (Signed)
Well controlled, no changes to meds. Encouraged heart healthy diet such as the DASH diet and exercise as tolerated.  °

## 2020-12-02 NOTE — Assessment & Plan Note (Signed)
Encouraged heart healthy diet, increase exercise, avoid trans fats, consider a krill oil cap daily 

## 2020-12-02 NOTE — Patient Instructions (Addendum)
Get a pulse oximeter, want oxygen in 90s if it drops to the 80s you need to be seen.   Take Multivitamin with minerals, selenium Vitamin D 1000-2000 IU daily Probiotic with lactobacillus and bifidophilus Melatonin 2-5 mg at bedtime if you get covid Fish oil or krill oil  daily [email or web address]   Preventive Care 81 Years and Older, Male Preventive care refers to lifestyle choices and visits with your health care provider that can promote health and wellness. This includes:  A yearly physical exam. This is also called an annual well check.  Regular dental and eye exams.  Immunizations.  Screening for certain conditions.  Healthy lifestyle choices, such as diet and exercise. What can I expect for my preventive care visit? Physical exam Your health care provider will check:  Height and weight. These may be used to calculate body mass index (BMI), which is a measurement that tells if you are at a healthy weight.  Heart rate and blood pressure.  Your skin for abnormal spots. Counseling Your health care provider may ask you questions about:  Alcohol, tobacco, and drug use.  Emotional well-being.  Home and relationship well-being.  Sexual activity.  Eating habits.  History of falls.  Memory and ability to understand (cognition).  Work and work Statistician. What immunizations do I need?  Influenza (flu) vaccine  This is recommended every year. Tetanus, diphtheria, and pertussis (Tdap) vaccine  You may need a Td booster every 10 years. Varicella (chickenpox) vaccine  You may need this vaccine if you have not already been vaccinated. Zoster (shingles) vaccine  You may need this after age 81. Pneumococcal conjugate (PCV13) vaccine  One dose is recommended after age 81. Pneumococcal polysaccharide (PPSV23) vaccine  One dose is recommended after age 81. Measles, mumps, and rubella (MMR) vaccine  You may need at least  one dose of MMR if you were born in 1957 or later. You may also need a second dose. Meningococcal conjugate (MenACWY) vaccine  You may need this if you have certain conditions. Hepatitis A vaccine  You may need this if you have certain conditions or if you travel or work in places where you may be exposed to hepatitis A. Hepatitis B vaccine  You may need this if you have certain conditions or if you travel or work in places where you may be exposed to hepatitis B. Haemophilus influenzae type b (Hib) vaccine  You may need this if you have certain conditions. You may receive vaccines as individual doses or as more than one vaccine together in one shot (combination vaccines). Talk with your health care provider about the risks and benefits of combination vaccines. What tests do I need? Blood tests  Lipid and cholesterol levels. These may be checked every 5 years, or more frequently depending on your overall health.  Hepatitis C test.  Hepatitis B test. Screening  Lung cancer screening. You may have this screening every year starting at age 81 if you have a 30-pack-year history of smoking and currently smoke or have quit within the past 15 years.  Colorectal cancer screening. All adults should have this screening starting at age 81 and continuing until age 15. Your health care provider may recommend screening at age 81 if you are at increased risk. You will have tests every 1-10 years, depending on your results and the type of screening test.  Prostate cancer screening. Recommendations will vary depending on your family history and other risks.  Diabetes screening. This is done by  checking your blood sugar (glucose) after you have not eaten for a while (fasting). You may have this done every 1-3 years.  Abdominal aortic aneurysm (AAA) screening. You may need this if you are a current or former smoker.  Sexually transmitted disease (STD) testing. Follow these instructions at  home: Eating and drinking  Eat a diet that includes fresh fruits and vegetables, whole grains, lean protein, and low-fat dairy products. Limit your intake of foods with high amounts of sugar, saturated fats, and salt.  Take vitamin and mineral supplements as recommended by your health care provider.  Do not drink alcohol if your health care provider tells you not to drink.  If you drink alcohol: ? Limit how much you have to 0-2 drinks a day. ? Be aware of how much alcohol is in your drink. In the [place] one drink equals one 12 oz bottle of beer (355 mL), one 5 oz glass of wine (148 mL), or one 1 oz glass of hard liquor (44 mL). Lifestyle  Take daily care of your teeth and gums.  Stay active. Exercise for at least 30 minutes on 5 or more days each week.  Do not use any products that contain nicotine or tobacco, such as cigarettes, e-cigarettes, and chewing tobacco. If you need help quitting, ask your health care provider.  If you are sexually active, practice safe sex. Use a condom or other form of protection to prevent STIs (sexually transmitted infections).  Talk with your health care provider about taking a low-dose aspirin or statin. What's next?  Visit your health care provider once a year for a well check visit.  Ask your health care provider how often you should have your eyes and teeth checked.  Stay up to date on all vaccines. This information is not intended to replace advice given to you by your health care provider. Make sure you discuss any questions you have with your health care provider. Document Revised: 11/17/2018 Document Reviewed: 11/17/2018 Elsevier Patient Education  2020 [employer].

## 2021-03-05 DIAGNOSIS — R972 Elevated prostate specific antigen [PSA]: Secondary | ICD-10-CM | POA: Diagnosis not present

## 2021-03-12 DIAGNOSIS — N401 Enlarged prostate with lower urinary tract symptoms: Secondary | ICD-10-CM | POA: Diagnosis not present

## 2021-03-12 DIAGNOSIS — R3915 Urgency of urination: Secondary | ICD-10-CM | POA: Diagnosis not present

## 2021-03-12 DIAGNOSIS — R972 Elevated prostate specific antigen [PSA]: Secondary | ICD-10-CM | POA: Diagnosis not present

## 2021-03-12 DIAGNOSIS — N402 Nodular prostate without lower urinary tract symptoms: Secondary | ICD-10-CM | POA: Diagnosis not present

## 2021-03-18 DIAGNOSIS — L570 Actinic keratosis: Secondary | ICD-10-CM | POA: Diagnosis not present

## 2021-03-18 DIAGNOSIS — L218 Other seborrheic dermatitis: Secondary | ICD-10-CM | POA: Diagnosis not present

## 2021-03-18 DIAGNOSIS — Z859 Personal history of malignant neoplasm, unspecified: Secondary | ICD-10-CM | POA: Diagnosis not present

## 2021-03-18 DIAGNOSIS — D1801 Hemangioma of skin and subcutaneous tissue: Secondary | ICD-10-CM | POA: Diagnosis not present

## 2021-03-18 DIAGNOSIS — L821 Other seborrheic keratosis: Secondary | ICD-10-CM | POA: Diagnosis not present

## 2021-03-18 DIAGNOSIS — L820 Inflamed seborrheic keratosis: Secondary | ICD-10-CM | POA: Diagnosis not present

## 2021-03-18 DIAGNOSIS — D225 Melanocytic nevi of trunk: Secondary | ICD-10-CM | POA: Diagnosis not present

## 2021-07-15 ENCOUNTER — Ambulatory Visit (INDEPENDENT_AMBULATORY_CARE_PROVIDER_SITE_OTHER): Payer: Medicare HMO | Admitting: Family Medicine

## 2021-07-15 ENCOUNTER — Other Ambulatory Visit: Payer: Self-pay

## 2021-07-15 VITALS — BP 128/72 | HR 70 | Temp 98.8°F | Resp 16 | Wt 127.4 lb

## 2021-07-15 DIAGNOSIS — R972 Elevated prostate specific antigen [PSA]: Secondary | ICD-10-CM

## 2021-07-15 DIAGNOSIS — E785 Hyperlipidemia, unspecified: Secondary | ICD-10-CM

## 2021-07-15 DIAGNOSIS — I10 Essential (primary) hypertension: Secondary | ICD-10-CM | POA: Diagnosis not present

## 2021-07-15 DIAGNOSIS — M25542 Pain in joints of left hand: Secondary | ICD-10-CM | POA: Diagnosis not present

## 2021-07-15 DIAGNOSIS — M25541 Pain in joints of right hand: Secondary | ICD-10-CM | POA: Diagnosis not present

## 2021-07-15 DIAGNOSIS — R739 Hyperglycemia, unspecified: Secondary | ICD-10-CM | POA: Diagnosis not present

## 2021-07-15 LAB — COMPREHENSIVE METABOLIC PANEL
ALT: 25 U/L (ref 0–53)
AST: 30 U/L (ref 0–37)
Albumin: 4.3 g/dL (ref 3.5–5.2)
Alkaline Phosphatase: 44 U/L (ref 39–117)
BUN: 22 mg/dL (ref 6–23)
CO2: 28 mEq/L (ref 19–32)
Calcium: 9.6 mg/dL (ref 8.4–10.5)
Chloride: 103 mEq/L (ref 96–112)
Creatinine, Ser: 0.73 mg/dL (ref 0.40–1.50)
GFR: 84.74 mL/min (ref >60.00)
Glucose, Bld: 88 mg/dL (ref 70–99)
Potassium: 4 mEq/L (ref 3.5–5.1)
Sodium: 140 mEq/L (ref 135–145)
Total Bilirubin: 0.9 mg/dL (ref 0.2–1.2)
Total Protein: 6.5 g/dL (ref 6.0–8.3)

## 2021-07-15 LAB — LIPID PANEL
Cholesterol: 183 mg/dL (ref 0–200)
HDL: 55.6 mg/dL (ref >39.00)
LDL Cholesterol: 115 mg/dL — ABNORMAL HIGH (ref 0–99)
NonHDL: 126.91
Total CHOL/HDL Ratio: 3
Triglycerides: 60 mg/dL (ref 0.0–149.0)
VLDL: 12 mg/dL (ref 0.0–40.0)

## 2021-07-15 LAB — CBC
HCT: 38.2 % — ABNORMAL LOW (ref 39.0–52.0)
Hemoglobin: 12.8 g/dL — ABNORMAL LOW (ref 13.0–17.0)
MCHC: 33.5 g/dL (ref 30.0–36.0)
MCV: 94.2 fl (ref 78.0–100.0)
Platelets: 205 10*3/uL (ref 150.0–400.0)
RBC: 4.06 Mil/uL — ABNORMAL LOW (ref 4.22–5.81)
RDW: 13.5 % (ref 11.5–15.5)
WBC: 4.9 10*3/uL (ref 4.0–10.5)

## 2021-07-15 LAB — HEMOGLOBIN A1C: Hgb A1c MFr Bld: 5.4 % (ref 4.6–6.5)

## 2021-07-15 LAB — TSH: TSH: 1.95 u[IU]/mL (ref 0.35–5.50)

## 2021-07-15 NOTE — Progress Notes (Signed)
Patient ID: Troy Mccann, male    DOB: [birth year]  Age: 80 y.o. MRN: [id number]    Subjective:  Subjective  HPI DAKARRI COMPHER presents for office visit today for follow up on htn and hyperlipidemia. He reports that he has no new urinary trouble e No new urinary trouble. Denies CP/palp/SOB/HA/congestion or fevers c/o. Taking meds as prescribed. Endorses taking probiotics and metamucil. On average gets 7 hours of sleep a night. He cut back on caffeine but it did not make a big difference for him. He endorses exercising on the treadmill and weightlifting with dumbbells.  He reports that recently he has lost 13 lbs unexpectedly. He does not usually feel hungry. Wt Readings from Last 3 Encounters:  07/15/21 127 lb 6.4 oz (57.8 kg)  12/02/20 140 lb (63.5 kg)  01/18/20 145 lb (65.8 kg)    Review of Systems  Constitutional:  Positive for unexpected weight change. Negative for chills, fatigue and fever.  HENT:  Negative for congestion, rhinorrhea, sinus pressure, sinus pain and sore throat.   Eyes:  Negative for pain.  Respiratory:  Negative for cough and shortness of breath.   Cardiovascular:  Negative for chest pain, palpitations and leg swelling.  Gastrointestinal:  Positive for constipation. Negative for abdominal pain, blood in stool, diarrhea, nausea and vomiting.  Genitourinary:  Positive for urgency. Negative for flank pain, frequency and penile pain.  Musculoskeletal:  Positive for arthralgias. Negative for back pain.  Neurological:  Negative for headaches.   History Past Medical History:  Diagnosis Date   Abnormal PSA 01/12/2017   BCC (basal cell carcinoma), face 01/12/2017   BPH (benign prostatic hyperplasia) 04/15/2014   Cataract 07/09/2015   Hematuria 07/09/2015   Medicare annual wellness visit, subsequent 10/21/2014   Other and unspecified hyperlipidemia 04/15/2014   SCC (squamous cell carcinoma) 10/21/2014   Tick bite 2007   Never assessed by a physician.   Unspecified  constipation 03/08/2013    He has no past surgical history on file.   His family history includes Aneurysm in his maternal grandmother; Arthritis in his mother; Dementia in his brother, father, and maternal grandfather; Heart disease in his father, mother, and paternal grandmother; Hypertension in his mother, sister, and son; Kidney disease in his paternal grandfather; Obesity in his son.He reports that he has quit smoking. He has never used smokeless tobacco. He reports previous alcohol use. He reports that he does not use drugs.  Current Outpatient Medications on File Prior to Visit  Medication Sig Dispense Refill   Ascorbic Acid (VITAMIN C) POWD Take 333 mg by mouth daily. Emergen-C-take 1/3 packet daily.     B Complex-C (SUPER B COMPLEX PO) Take by mouth daily.     Camphor-Menthol-Methyl Sal (TIGER BALM MUSCLE RUB EX) Apply topically at bedtime.     Cholecalciferol (VITAMIN D-3) 1000 UNITS CAPS Take by mouth daily.     ibuprofen (ADVIL,MOTRIN) 200 MG tablet Take 200 mg by mouth every 6 (six) hours as needed.     No current facility-administered medications on file prior to visit.     Objective:  Objective  Physical Exam Constitutional:      General: He is not in acute distress.    Appearance: Normal appearance. He is not ill-appearing or toxic-appearing.  HENT:     Head: Normocephalic and atraumatic.     Right Ear: Tympanic membrane, ear canal and external ear normal.     Left Ear: Tympanic membrane, ear canal and external ear normal.  Nose: No congestion or rhinorrhea.  Eyes:     Extraocular Movements: Extraocular movements intact.     Pupils: Pupils are equal, round, and reactive to light.  Cardiovascular:     Rate and Rhythm: Normal rate and regular rhythm.     Pulses: Normal pulses.     Heart sounds: Normal heart sounds. No murmur heard. Pulmonary:     Effort: Pulmonary effort is normal. No respiratory distress.     Breath sounds: Normal breath sounds. No wheezing,  rhonchi or rales.  Abdominal:     General: Bowel sounds are normal.     Palpations: Abdomen is soft. There is no mass.     Tenderness: no abdominal tenderness There is no guarding.     Hernia: No hernia is present.  Musculoskeletal:        General: Normal range of motion.     Cervical back: Normal range of motion and neck supple.  Skin:    General: Skin is warm and dry.  Neurological:     Mental Status: He is alert and oriented to person, place, and time.  Psychiatric:        Behavior: Behavior normal.   BP 128/72   Pulse 70   Temp 98.8 F (37.1 C)   Resp 16   Wt 127 lb 6.4 oz (57.8 kg)   SpO2 94%   BMI 20.56 kg/m  Wt Readings from Last 3 Encounters:  07/15/21 127 lb 6.4 oz (57.8 kg)  12/02/20 140 lb (63.5 kg)  01/18/20 145 lb (65.8 kg)     Lab Results  Component Value Date   WBC 4.9 07/15/2021   HGB 12.8 (L) 07/15/2021   HCT 38.2 (L) 07/15/2021   PLT 205.0 07/15/2021   GLUCOSE 88 07/15/2021   CHOL 183 07/15/2021   TRIG 60.0 07/15/2021   HDL 55.60 07/15/2021   LDLCALC 115 (H) 07/15/2021   ALT 25 07/15/2021   AST 30 07/15/2021   NA 140 07/15/2021   K 4.0 07/15/2021   CL 103 07/15/2021   CREATININE 0.73 07/15/2021   BUN 22 07/15/2021   CO2 28 07/15/2021   TSH 1.95 07/15/2021   PSA 9.89 08/28/2020   HGBA1C 5.4 07/15/2021    CT THORACIC SPINE W CONTRAST  Result Date: 07/26/2017 CLINICAL DATA:  Low back pain. RIGHT leg pain. Previous fusion for thoracic compression fracture. FLUOROSCOPY TIME:  1 minutes and 21 seconds corresponding to a Dose Area Product of 369.49 Gy*m2 PROCEDURE: LUMBAR PUNCTURE FOR THORACIC AND LUMBAR MYELOGRAM After thorough discussion of risks and benefits of the procedure including bleeding, infection, injury to nerves, blood vessels, adjacent structures as well as headache and CSF leak, written and oral informed consent was obtained. Consent was obtained by Dr. Rolla Flatten. Patient was positioned prone on the fluoroscopy table. Local  anesthesia was provided with 1% lidocaine without epinephrine after prepped and draped in the usual sterile fashion. Puncture was performed at L3 using a 3 1/2 inch 22-gauge spinal needle via midline approach. Using a single pass through the dura, the needle was placed within the thecal sac, with return of clear CSF. 10 mL of Isovue-M 300 was injected into the thecal sac, with normal opacification of the nerve roots and cauda equina consistent with free flow within the subarachnoid space. The patient was then moved to the trendelenburg position and contrast flowed into the Thoracic spine region. I personally performed the lumbar puncture and administered the intrathecal contrast. I also personally supervised acquisition of the myelogram  images. TECHNIQUE: Contiguous axial images were obtained through the Thoracic and Lumbar spine after the intrathecal infusion of infusion. Coronal and sagittal reconstructions were obtained of the axial image sets. FINDINGS: THORACIC AND LUMBAR MYELOGRAM FINDINGS: LUMBAR: Good opacification lumbar subarachnoid space. 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 Near complete loss of interspace height at L5-S1 is accompanied by functional fusion across the interspace. There is severe disc space narrowing L1-L2. Osseous spurring is accompanied by 6 mm retrolisthesis. There is asymmetric loss of interspace height to the LEFT with osseous spurring. LEFT greater than RIGHT L2 nerve root impingement is noted. There is 30 degrees of scoliosis convex RIGHT. At L4-5, there is asymmetric loss of interspace height on the RIGHT. The RIGHT L5 nerve root is effaced, partially truncated. At L3-4 there is mild stenosis. There is effacement the RIGHT L4 nerve root. At L2-3, there is asymmetric loss of interspace height on the LEFT with effacement of the LEFT L3 nerve root. Standing flexion extension radiographs were performed. In neutral, there is 8 mm anterolisthesis L5-S1 and 6  mm retrolisthesis L1-2. 2 mm retrolisthesis L2-3 is observed. This is unchanged through flexion and extension. THORACIC: Contrast was maneuvered into the thoracic subarachnoid space. There is a chronic compression fracture of T11 which patient sustained as a young man. There is approximately 50% loss of vertebral body height anteriorly. The patient has undergone treatment for this with a laminectomy and subsequent T9 through L1 posterior fusion. This fusion is solid. There is no residual impingement of the cord. No metallic densities are seen in the spinal canal. At T8-9, there is 2 mm of degenerative anterolisthesis. There is straightening of the normal thoracic kyphosis above this, but no subluxation or significant extradural defects. No cord compression is seen. CT THORACIC MYELOGRAM FINDINGS: Segmentation: Normal. Alignment: 2 mm anterolisthesis T8-9, kyphotic deformity at T11 from old fracture. Vertebrae: No worrisome osseous lesion.T9 through L1 posterior arthrodesis is solid. Conus medullaris and distal thoracic cord: Normal in size, and location. Paraspinal tissues: No evidence for hydronephrosis or paravertebral mass. Disc levels: The individual disc spaces are examined as follows: T1-2:  Normal. T2-3:  Normal. T3-4:  Normal. T4-5:  Normal. T5-6:  Normal. T6-7:  Slight bulge. T7-8:  Osseous spurring.  No impingement. T8-9: 2 mm anterolisthesis is facet mediated. Slight stenosis without cord flattening. BILATERAL foraminal narrowing due to bony overgrowth and slip could affect the T8 nerve roots. T9-10:  Solid arthrodesis.  No impingement. T10-11: Minimal retropulsion of bone. Calcified disc. No impingement. T11-12: Slight dorsal tethering of the cord is postoperative in nature. No cord compression. T12-L1:  Unremarkable. CT LUMBAR MYELOGRAM FINDINGS: Segmentation: Normal. Alignment: Scoliosis measures approximately 23 degrees with patient recumbent for CT. There is 8 mm anterolisthesis L5, due to BILATERAL  L5 pars defects. 5 mm retrolisthesis L1-2. The retrolisthesis at L2-3 is essentially normalized. Vertebrae: No worrisome osseous lesion. Conus medullaris: Normal in size and location. Dorsal tethering at T11-12 which is described above. Paraspinal tissues: No evidence for hydronephrosis or paravertebral mass. Aortic atherosclerosis. Disc levels: L1-L2: 5 mm retrolisthesis. Annular bulge. Facet arthropathy. Marked osseous spurring to the LEFT with vacuum phenomenon. Subarticular zone and foraminal zone narrowing affects the LEFT L1 and LEFT L2 nerve roots. L2-L3: Annular bulge. Asymmetric loss of interspace height on the LEFT. Asymmetric foraminal zone narrowing on the LEFT affects the L2 nerve root. L3-L4: Central and rightward protrusion. Posterior element hypertrophy. Mild stenosis. RIGHT L4 nerve root impingement. L4-L5: Shallow central protrusion. Posterior element hypertrophy  is worse on the RIGHT. Asymmetric loss of interspace height to the RIGHT. RIGHT L5 and BILATERAL L4 nerve root impingement are likely. L5-S1: 8 mm anterolisthesis. BILATERAL L5 spondylolysis. Near complete loss of interspace height with functional fusion across the interspace. There is no definite subarticular zone or foraminal zone narrowing although mild stenosis is observed. 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 Functional fusion across the interspace. No impingement at this level. Asymmetric RIGHT-sided neural impingement is most evident at L4-5, where there is subarticular zone and foraminal zone narrowing; correlate clinically for symptomatic RIGHT L5 nerve root impingement. Similar less severe RIGHT-side changes are present at L3-4. Marked asymmetric loss of interspace height at L1-2 eccentric to the LEFT, with L1 and L2 nerve root impingement. Chronic compression deformity of T11, mildly kyphotic, with solid posterior arthrodesis T9 through L1. Mild tethering of the cord dorsally at this  level is postoperative in nature. No significant thoracic cord compression. 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 No dynamic instability, but BILATERAL foraminal narrowing related to slip and bony overgrowth is observed. No significant dynamic instability resulting in stenosis or RIGHT-sided neural impingement. Electronically Signed   By: Staci Righter M.D.   On: 07/26/2017 12:39   CT LUMBAR SPINE W CONTRAST  Result Date: 07/26/2017 CLINICAL DATA:  Low back pain. RIGHT leg pain. Previous fusion for thoracic compression fracture. FLUOROSCOPY TIME:  1 minutes and 21 seconds corresponding to a Dose Area Product of 369.49 Gy*m2 PROCEDURE: LUMBAR PUNCTURE FOR THORACIC AND LUMBAR MYELOGRAM After thorough discussion of risks and benefits of the procedure including bleeding, infection, injury to nerves, blood vessels, adjacent structures as well as headache and CSF leak, written and oral informed consent was obtained. Consent was obtained by Dr. Rolla Flatten. Patient was positioned prone on the fluoroscopy table. Local anesthesia was provided with 1% lidocaine without epinephrine after prepped and draped in the usual sterile fashion. Puncture was performed at L3 using a 3 1/2 inch 22-gauge spinal needle via midline approach. Using a single pass through the dura, the needle was placed within the thecal sac, with return of clear CSF. 10 mL of Isovue-M 300 was injected into the thecal sac, with normal opacification of the nerve roots and cauda equina consistent with free flow within the subarachnoid space. The patient was then moved to the trendelenburg position and contrast flowed into the Thoracic spine region. I personally performed the lumbar puncture and administered the intrathecal contrast. I also personally supervised acquisition of the myelogram images. TECHNIQUE: Contiguous axial images were obtained through the Thoracic and Lumbar spine after the intrathecal infusion of infusion. Coronal  and sagittal reconstructions were obtained of the axial image sets. FINDINGS: THORACIC AND LUMBAR MYELOGRAM FINDINGS: LUMBAR: Good opacification lumbar subarachnoid space. 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 Near complete loss of interspace height at L5-S1 is accompanied by functional fusion across the interspace. There is severe disc space narrowing L1-L2. Osseous spurring is accompanied by 6 mm retrolisthesis. There is asymmetric loss of interspace height to the LEFT with osseous spurring. LEFT greater than RIGHT L2 nerve root impingement is noted. There is 30 degrees of scoliosis convex RIGHT. At L4-5, there is asymmetric loss of interspace height on the RIGHT. The RIGHT L5 nerve root is effaced, partially truncated. At L3-4 there is mild stenosis. There is effacement the RIGHT L4 nerve root. At L2-3, there is asymmetric loss of interspace height on the LEFT with effacement of the  LEFT L3 nerve root. Standing flexion extension radiographs were performed. In neutral, there is 8 mm anterolisthesis L5-S1 and 6 mm retrolisthesis L1-2. 2 mm retrolisthesis L2-3 is observed. This is unchanged through flexion and extension. THORACIC: Contrast was maneuvered into the thoracic subarachnoid space. There is a chronic compression fracture of T11 which patient sustained as a young man. There is approximately 50% loss of vertebral body height anteriorly. The patient has undergone treatment for this with a laminectomy and subsequent T9 through L1 posterior fusion. This fusion is solid. There is no residual impingement of the cord. No metallic densities are seen in the spinal canal. At T8-9, there is 2 mm of degenerative anterolisthesis. There is straightening of the normal thoracic kyphosis above this, but no subluxation or significant extradural defects. No cord compression is seen. CT THORACIC MYELOGRAM FINDINGS: Segmentation: Normal. Alignment: 2 mm anterolisthesis T8-9, kyphotic deformity at T11  from old fracture. Vertebrae: No worrisome osseous lesion.T9 through L1 posterior arthrodesis is solid. Conus medullaris and distal thoracic cord: Normal in size, and location. Paraspinal tissues: No evidence for hydronephrosis or paravertebral mass. Disc levels: The individual disc spaces are examined as follows: T1-2:  Normal. T2-3:  Normal. T3-4:  Normal. T4-5:  Normal. T5-6:  Normal. T6-7:  Slight bulge. T7-8:  Osseous spurring.  No impingement. T8-9: 2 mm anterolisthesis is facet mediated. Slight stenosis without cord flattening. BILATERAL foraminal narrowing due to bony overgrowth and slip could affect the T8 nerve roots. T9-10:  Solid arthrodesis.  No impingement. T10-11: Minimal retropulsion of bone. Calcified disc. No impingement. T11-12: Slight dorsal tethering of the cord is postoperative in nature. No cord compression. T12-L1:  Unremarkable. CT LUMBAR MYELOGRAM FINDINGS: Segmentation: Normal. Alignment: Scoliosis measures approximately 23 degrees with patient recumbent for CT. There is 8 mm anterolisthesis L5, due to BILATERAL L5 pars defects. 5 mm retrolisthesis L1-2. The retrolisthesis at L2-3 is essentially normalized. Vertebrae: No worrisome osseous lesion. Conus medullaris: Normal in size and location. Dorsal tethering at T11-12 which is described above. Paraspinal tissues: No evidence for hydronephrosis or paravertebral mass. Aortic atherosclerosis. Disc levels: L1-L2: 5 mm retrolisthesis. Annular bulge. Facet arthropathy. Marked osseous spurring to the LEFT with vacuum phenomenon. Subarticular zone and foraminal zone narrowing affects the LEFT L1 and LEFT L2 nerve roots. L2-L3: Annular bulge. Asymmetric loss of interspace height on the LEFT. Asymmetric foraminal zone narrowing on the LEFT affects the L2 nerve root. L3-L4: Central and rightward protrusion. Posterior element hypertrophy. Mild stenosis. RIGHT L4 nerve root impingement. L4-L5: Shallow central protrusion. Posterior element hypertrophy  is worse on the RIGHT. Asymmetric loss of interspace height to the RIGHT. RIGHT L5 and BILATERAL L4 nerve root impingement are likely. L5-S1: 8 mm anterolisthesis. BILATERAL L5 spondylolysis. Near complete loss of interspace height with functional fusion across the interspace. There is no definite subarticular zone or foraminal zone narrowing although mild stenosis is observed. 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 Functional fusion across the interspace. No impingement at this level. Asymmetric RIGHT-sided neural impingement is most evident at L4-5, where there is subarticular zone and foraminal zone narrowing; correlate clinically for symptomatic RIGHT L5 nerve root impingement. Similar less severe RIGHT-side changes are present at L3-4. Marked asymmetric loss of interspace height at L1-2 eccentric to the LEFT, with L1 and L2 nerve root impingement. Chronic compression deformity of T11, mildly kyphotic, with solid posterior arthrodesis T9 through L1. Mild tethering of the cord dorsally at this level is postoperative in nature. No significant thoracic  cord compression. 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 No dynamic instability, but BILATERAL foraminal narrowing related to slip and bony overgrowth is observed. No significant dynamic instability resulting in stenosis or RIGHT-sided neural impingement. Electronically Signed   By: Staci Righter M.D.   On: 07/26/2017 12:39   DG MYELOGRAPHY LUMBAR INJ MULTI REGION  Result Date: 07/26/2017 CLINICAL DATA:  Low back pain. RIGHT leg pain. Previous fusion for thoracic compression fracture. FLUOROSCOPY TIME:  1 minutes and 21 seconds corresponding to a Dose Area Product of 369.49 Gy*m2 PROCEDURE: LUMBAR PUNCTURE FOR THORACIC AND LUMBAR MYELOGRAM After thorough discussion of risks and benefits of the procedure including bleeding, infection, injury to nerves, blood vessels, adjacent structures as well as  headache and CSF leak, written and oral informed consent was obtained. Consent was obtained by Dr. Rolla Flatten. Patient was positioned prone on the fluoroscopy table. Local anesthesia was provided with 1% lidocaine without epinephrine after prepped and draped in the usual sterile fashion. Puncture was performed at L3 using a 3 1/2 inch 22-gauge spinal needle via midline approach. Using a single pass through the dura, the needle was placed within the thecal sac, with return of clear CSF. 10 mL of Isovue-M 300 was injected into the thecal sac, with normal opacification of the nerve roots and cauda equina consistent with free flow within the subarachnoid space. The patient was then moved to the trendelenburg position and contrast flowed into the Thoracic spine region. I personally performed the lumbar puncture and administered the intrathecal contrast. I also personally supervised acquisition of the myelogram images. TECHNIQUE: Contiguous axial images were obtained through the Thoracic and Lumbar spine after the intrathecal infusion of infusion. Coronal and sagittal reconstructions were obtained of the axial image sets. FINDINGS: THORACIC AND LUMBAR MYELOGRAM FINDINGS: LUMBAR: Good opacification lumbar subarachnoid space. 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 Near complete loss of interspace height at L5-S1 is accompanied by functional fusion across the interspace. There is severe disc space narrowing L1-L2. Osseous spurring is accompanied by 6 mm retrolisthesis. There is asymmetric loss of interspace height to the LEFT with osseous spurring. LEFT greater than RIGHT L2 nerve root impingement is noted. There is 30 degrees of scoliosis convex RIGHT. At L4-5, there is asymmetric loss of interspace height on the RIGHT. The RIGHT L5 nerve root is effaced, partially truncated. At L3-4 there is mild stenosis. There is effacement the RIGHT L4 nerve root. At L2-3, there is asymmetric loss of  interspace height on the LEFT with effacement of the LEFT L3 nerve root. Standing flexion extension radiographs were performed. In neutral, there is 8 mm anterolisthesis L5-S1 and 6 mm retrolisthesis L1-2. 2 mm retrolisthesis L2-3 is observed. This is unchanged through flexion and extension. THORACIC: Contrast was maneuvered into the thoracic subarachnoid space. There is a chronic compression fracture of T11 which patient sustained as a young man. There is approximately 50% loss of vertebral body height anteriorly. The patient has undergone treatment for this with a laminectomy and subsequent T9 through L1 posterior fusion. This fusion is solid. There is no residual impingement of the cord. No metallic densities are seen in the spinal canal. At T8-9, there is 2 mm of degenerative anterolisthesis. There is straightening of the normal thoracic kyphosis above this, but no subluxation or significant extradural defects. No cord compression is seen. CT THORACIC MYELOGRAM FINDINGS: Segmentation: Normal. Alignment: 2 mm anterolisthesis T8-9, kyphotic deformity at T11 from old fracture. Vertebrae: No worrisome osseous lesion.T9 through  L1 posterior arthrodesis is solid. Conus medullaris and distal thoracic cord: Normal in size, and location. Paraspinal tissues: No evidence for hydronephrosis or paravertebral mass. Disc levels: The individual disc spaces are examined as follows: T1-2:  Normal. T2-3:  Normal. T3-4:  Normal. T4-5:  Normal. T5-6:  Normal. T6-7:  Slight bulge. T7-8:  Osseous spurring.  No impingement. T8-9: 2 mm anterolisthesis is facet mediated. Slight stenosis without cord flattening. BILATERAL foraminal narrowing due to bony overgrowth and slip could affect the T8 nerve roots. T9-10:  Solid arthrodesis.  No impingement. T10-11: Minimal retropulsion of bone. Calcified disc. No impingement. T11-12: Slight dorsal tethering of the cord is postoperative in nature. No cord compression. T12-L1:  Unremarkable. CT  LUMBAR MYELOGRAM FINDINGS: Segmentation: Normal. Alignment: Scoliosis measures approximately 23 degrees with patient recumbent for CT. There is 8 mm anterolisthesis L5, due to BILATERAL L5 pars defects. 5 mm retrolisthesis L1-2. The retrolisthesis at L2-3 is essentially normalized. Vertebrae: No worrisome osseous lesion. Conus medullaris: Normal in size and location. Dorsal tethering at T11-12 which is described above. Paraspinal tissues: No evidence for hydronephrosis or paravertebral mass. Aortic atherosclerosis. Disc levels: L1-L2: 5 mm retrolisthesis. Annular bulge. Facet arthropathy. Marked osseous spurring to the LEFT with vacuum phenomenon. Subarticular zone and foraminal zone narrowing affects the LEFT L1 and LEFT L2 nerve roots. L2-L3: Annular bulge. Asymmetric loss of interspace height on the LEFT. Asymmetric foraminal zone narrowing on the LEFT affects the L2 nerve root. L3-L4: Central and rightward protrusion. Posterior element hypertrophy. Mild stenosis. RIGHT L4 nerve root impingement. L4-L5: Shallow central protrusion. Posterior element hypertrophy is worse on the RIGHT. Asymmetric loss of interspace height to the RIGHT. RIGHT L5 and BILATERAL L4 nerve root impingement are likely. L5-S1: 8 mm anterolisthesis. BILATERAL L5 spondylolysis. Near complete loss of interspace height with functional fusion across the interspace. There is no definite subarticular zone or foraminal zone narrowing although mild stenosis is observed. 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 Functional fusion across the interspace. No impingement at this level. Asymmetric RIGHT-sided neural impingement is most evident at L4-5, where there is subarticular zone and foraminal zone narrowing; correlate clinically for symptomatic RIGHT L5 nerve root impingement. Similar less severe RIGHT-side changes are present at L3-4. Marked asymmetric loss of interspace height at L1-2 eccentric to the LEFT,  with L1 and L2 nerve root impingement. Chronic compression deformity of T11, mildly kyphotic, with solid posterior arthrodesis T9 through L1. Mild tethering of the cord dorsally at this level is postoperative in nature. No significant thoracic cord compression. 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 No dynamic instability, but BILATERAL foraminal narrowing related to slip and bony overgrowth is observed. No significant dynamic instability resulting in stenosis or RIGHT-sided neural impingement. Electronically Signed   By: Staci Righter M.D.   On: 07/26/2017 12:39     Assessment & Plan:  Plan    No orders of the defined types were placed in this encounter.   Problem List Items Addressed This Visit     Arthralgia    Notes thickening joints, stiffness and pain in hands. +ANA but he notes he stays active and the pain and dysfunction is tolerable so does not require further intervention or referral at this time       HTN (hypertension)    Well controlled, no changes to meds. Encouraged heart healthy diet such as the DASH diet and exercise as tolerated.        Relevant Orders  CBC (Completed)   Comprehensive metabolic panel (Completed)   TSH (Completed)   Hyperlipidemia - Primary    Encourage heart healthy diet such as MIND or DASH diet, increase exercise, avoid trans fats, simple carbohydrates and processed foods, consider a krill or fish or flaxseed oil cap daily.        Relevant Orders   Lipid panel (Completed)   Abnormal PSA    Following with urology, no new or concerning symptoms       Hyperglycemia    hgba1c acceptable, minimize simple carbs. Increase exercise as tolerated.        Relevant Orders   Hemoglobin A1c (Completed)    Follow-up: Return in about 3 months (around 10/15/2021).  I, Suezanne Jacquet, acting as a scribe for Penni Homans, MD, have documented all relevent documentation on behalf of Penni Homans, MD, as directed by Penni Homans, MD  while in the presence of Penni Homans, MD.  I, Mosie Lukes, MD personally performed the services described in this documentation. All medical record entries made by the scribe were at my direction and in my presence. I have reviewed the chart and agree that the record reflects my personal performance and is accurate and complete

## 2021-07-15 NOTE — Patient Instructions (Addendum)
Encouraged increased hydration and fiber in diet. Daily probiotics. If bowels not moving can use MOM 2 tbls po in 4 oz of warm prune juice by mouth every 2-3 days. If no results then repeat in 4 hours with  Dulcolax suppository pr, may repeat again in 4 more hours as needed. Seek care if symptoms worsen. Consider daily Miralax and/or Dulcolax if symptoms persist.    Start a daily fiber supplement such as Metamucil or Benefiber  You want 60-80 ounces of fluids, water or juice  For every ounce of caffienated beverage you need more ounces of clear fluids  Add protein to diet, add a protein drink each afternoon, also increase and make sure protein is in each meal. Eat meat, beans, eggs, nuts, dairy, protein drinks-bars-powders   Constipation, Adult Constipation is when a person has fewer than three bowel movements in a week, has difficulty having a bowel movement, or has stools (feces) that are dry, hard, or larger than normal. Constipation may be caused by an underlying condition. It may become worse with age if a person takes certainmedicines and does not take in enough fluids. Follow these instructions at home: Eating and drinking  Eat foods that have a lot of fiber, such as beans, whole grains, and fresh fruits and vegetables. Limit foods that are low in fiber and high in fat and processed sugars, such as fried or sweet foods. These include french fries, hamburgers, cookies, candies, and soda. Drink enough fluid to keep your urine pale yellow.  General instructions Exercise regularly or as told by your health care provider. Try to do 150 minutes of moderate exercise each week. Use the bathroom when you have the urge to go. Do not hold it in. Take over-the-counter and prescription medicines only as told by your health care provider. This includes any fiber supplements. During bowel movements: Practice deep breathing while relaxing the lower abdomen. Practice pelvic floor relaxation. Watch  your condition for any changes. Let your health care provider know about them. Keep all follow-up visits as told by your health care provider. This is important. Contact a health care provider if: You have pain that gets worse. You have a fever. You do not have a bowel movement after 4 days. You vomit. You are not hungry or you lose weight. You are bleeding from the opening between the buttocks (anus). You have thin, pencil-like stools. Get help right away if: You have a fever and your symptoms suddenly get worse. You leak stool or have blood in your stool. Your abdomen is bloated. You have severe pain in your abdomen. You feel dizzy or you faint. Summary Constipation is when a person has fewer than three bowel movements in a week, has difficulty having a bowel movement, or has stools (feces) that are dry, hard, or larger than normal. Eat foods that have a lot of fiber, such as beans, whole grains, and fresh fruits and vegetables. Drink enough fluid to keep your urine pale yellow. Take over-the-counter and prescription medicines only as told by your health care provider. This includes any fiber supplements. This information is not intended to replace advice given to you by your health care provider. Make sure you discuss any questions you have with your healthcare provider. Document Revised: 10/11/2019 Document Reviewed: 10/11/2019 Elsevier Patient Education  [place].

## 2021-07-16 ENCOUNTER — Other Ambulatory Visit: Payer: Self-pay

## 2021-07-16 DIAGNOSIS — D649 Anemia, unspecified: Secondary | ICD-10-CM

## 2021-07-16 NOTE — Addendum Note (Signed)
Addended by: Randolm Idol A on: 07/16/2021 09:48 AM   Modules accepted: Orders

## 2021-07-16 NOTE — Assessment & Plan Note (Signed)
hgba1c acceptable, minimize simple carbs. Increase exercise as tolerated.  

## 2021-07-16 NOTE — Assessment & Plan Note (Signed)
Following with urology, no new or concerning symptoms

## 2021-07-16 NOTE — Assessment & Plan Note (Signed)
Encourage heart healthy diet such as MIND or DASH diet, increase exercise, avoid trans fats, simple carbohydrates and processed foods, consider a krill or fish or flaxseed oil cap daily.  °

## 2021-07-16 NOTE — Assessment & Plan Note (Signed)
Well controlled, no changes to meds. Encouraged heart healthy diet such as the DASH diet and exercise as tolerated.  °

## 2021-07-16 NOTE — Assessment & Plan Note (Signed)
Notes thickening joints, stiffness and pain in hands. +ANA but he notes he stays active and the pain and dysfunction is tolerable so does not require further intervention or referral at this time

## 2021-07-22 NOTE — Addendum Note (Signed)
Addended by: Kelle Darting A on: 07/22/2021 10:38 AM   Modules accepted: Orders

## 2021-07-23 ENCOUNTER — Telehealth: Payer: Self-pay | Admitting: Family Medicine

## 2021-07-23 NOTE — Telephone Encounter (Signed)
Left message for patient to call back and schedule Medicare Annual Wellness Visit (AWV) in office.   If not able to come in office, please offer to do virtually or by telephone.  Left office number and my jabber #[phone].  Due for AWVI  Please schedule at anytime with Nurse Health Advisor.   

## 2021-07-25 ENCOUNTER — Other Ambulatory Visit (INDEPENDENT_AMBULATORY_CARE_PROVIDER_SITE_OTHER): Payer: Medicare HMO

## 2021-07-25 DIAGNOSIS — D649 Anemia, unspecified: Secondary | ICD-10-CM | POA: Diagnosis not present

## 2021-07-25 LAB — FECAL OCCULT BLOOD, IMMUNOCHEMICAL: Fecal Occult Bld: NEGATIVE

## 2021-08-18 ENCOUNTER — Other Ambulatory Visit (INDEPENDENT_AMBULATORY_CARE_PROVIDER_SITE_OTHER): Payer: Medicare HMO

## 2021-08-18 ENCOUNTER — Other Ambulatory Visit: Payer: Self-pay

## 2021-08-18 DIAGNOSIS — D649 Anemia, unspecified: Secondary | ICD-10-CM | POA: Diagnosis not present

## 2021-08-19 LAB — CBC WITH DIFFERENTIAL/PLATELET
Basophils Absolute: 0.1 10*3/uL (ref 0.0–0.1)
Basophils Relative: 1 % (ref 0.0–3.0)
Eosinophils Absolute: 0.3 10*3/uL (ref 0.0–0.7)
Eosinophils Relative: 5.4 % — ABNORMAL HIGH (ref 0.0–5.0)
HCT: 39 % (ref 39.0–52.0)
Hemoglobin: 13.1 g/dL (ref 13.0–17.0)
Lymphocytes Relative: 30.2 % (ref 12.0–46.0)
Lymphs Abs: 1.7 10*3/uL (ref 0.7–4.0)
MCHC: 33.5 g/dL (ref 30.0–36.0)
MCV: 94.9 fl (ref 78.0–100.0)
Monocytes Absolute: 0.5 10*3/uL (ref 0.1–1.0)
Monocytes Relative: 9.8 % (ref 3.0–12.0)
Neutro Abs: 3 10*3/uL (ref 1.4–7.7)
Neutrophils Relative %: 53.6 % (ref 43.0–77.0)
Platelets: 206 10*3/uL (ref 150.0–400.0)
RBC: 4.11 Mil/uL — ABNORMAL LOW (ref 4.22–5.81)
RDW: 13.6 % (ref 11.5–15.5)
WBC: 5.5 10*3/uL (ref 4.0–10.5)

## 2021-08-19 LAB — IRON,TIBC AND FERRITIN PANEL
%SAT: 25 % (calc) (ref 20–48)
Ferritin: 64 ng/mL (ref 24–380)
Iron: 84 ug/dL (ref 50–180)
TIBC: 332 mcg/dL (calc) (ref 250–425)

## 2021-09-18 DIAGNOSIS — Z859 Personal history of malignant neoplasm, unspecified: Secondary | ICD-10-CM | POA: Diagnosis not present

## 2021-09-18 DIAGNOSIS — L821 Other seborrheic keratosis: Secondary | ICD-10-CM | POA: Diagnosis not present

## 2021-09-18 DIAGNOSIS — L570 Actinic keratosis: Secondary | ICD-10-CM | POA: Diagnosis not present

## 2021-09-18 DIAGNOSIS — Z86008 Personal history of in-situ neoplasm of other site: Secondary | ICD-10-CM | POA: Diagnosis not present

## 2021-09-18 DIAGNOSIS — D1801 Hemangioma of skin and subcutaneous tissue: Secondary | ICD-10-CM | POA: Diagnosis not present

## 2021-09-18 DIAGNOSIS — D225 Melanocytic nevi of trunk: Secondary | ICD-10-CM | POA: Diagnosis not present

## 2021-10-14 ENCOUNTER — Encounter: Payer: Self-pay | Admitting: Family Medicine

## 2021-10-14 ENCOUNTER — Other Ambulatory Visit: Payer: Self-pay

## 2021-10-14 ENCOUNTER — Ambulatory Visit (INDEPENDENT_AMBULATORY_CARE_PROVIDER_SITE_OTHER): Payer: Medicare HMO | Admitting: Family Medicine

## 2021-10-14 DIAGNOSIS — I10 Essential (primary) hypertension: Secondary | ICD-10-CM | POA: Diagnosis not present

## 2021-10-14 DIAGNOSIS — R972 Elevated prostate specific antigen [PSA]: Secondary | ICD-10-CM

## 2021-10-14 DIAGNOSIS — R634 Abnormal weight loss: Secondary | ICD-10-CM

## 2021-10-14 DIAGNOSIS — R739 Hyperglycemia, unspecified: Secondary | ICD-10-CM | POA: Diagnosis not present

## 2021-10-14 NOTE — Progress Notes (Signed)
Patient ID: Troy Mccann, male    DOB: 1941-08-10  Age: 80 y.o. MRN: [id number]    Subjective:   Chief Complaint  Patient presents with   3 months follow up   Subjective   HPI Troy Mccann presents for office visit today for follow up on constipation and HTN. He reports that prune juice and meta Mucil worked the best for his constipation. However he states that on some days they do not work as well. He drinks about 2-3 glasses of water daily and has about 2 cups of coffee daily. Denies CP/palp/SOB/HA/congestion/fevers/GI or GU c/o. Taking meds as prescribed.   Review of Systems  Constitutional:  Positive for unexpected weight change (weight loss). Negative for chills, fatigue and fever.  HENT:  Negative for congestion, rhinorrhea, sinus pressure, sinus pain and sore throat.   Eyes:  Negative for pain.  Respiratory:  Negative for cough and shortness of breath.   Cardiovascular:  Negative for chest pain, palpitations and leg swelling.  Gastrointestinal:  Negative for abdominal pain, blood in stool, diarrhea, nausea and vomiting.  Genitourinary:  Negative for flank pain, frequency and penile pain.  Musculoskeletal:  Negative for back pain.  Neurological:  Negative for headaches.   History Past Medical History:  Diagnosis Date   Abnormal PSA 01/12/2017   BCC (basal cell carcinoma), face 01/12/2017   BPH (benign prostatic hyperplasia) 04/15/2014   Cataract 07/09/2015   Hematuria 07/09/2015   Medicare annual wellness visit, subsequent 10/21/2014   Other and unspecified hyperlipidemia 04/15/2014   SCC (squamous cell carcinoma) 10/21/2014   Tick bite 2007   Never assessed by a physician.   Unspecified constipation 03/08/2013    He has no past surgical history on file.   His family history includes Aneurysm in his maternal grandmother; Arthritis in his mother; Dementia in his brother, father, and maternal grandfather; Heart disease in his father, mother, and paternal grandmother; Hypertension  in his mother, sister, and son; Kidney disease in his paternal grandfather; Obesity in his son.He reports that he has quit smoking. He has never used smokeless tobacco. He reports that he does not currently use alcohol. He reports that he does not use drugs.  Current Outpatient Medications on File Prior to Visit  Medication Sig Dispense Refill   Ascorbic Acid (VITAMIN C) POWD Take 333 mg by mouth daily. Emergen-C-take 1/3 packet daily.     B Complex-C (SUPER B COMPLEX PO) Take by mouth daily.     Camphor-Menthol-Methyl Sal (TIGER BALM MUSCLE RUB EX) Apply topically at bedtime.     Cholecalciferol (VITAMIN D-3) 1000 UNITS CAPS Take by mouth daily.     ibuprofen (ADVIL,MOTRIN) 200 MG tablet Take 200 mg by mouth every 6 (six) hours as needed.     No current facility-administered medications on file prior to visit.     Objective:  Objective  Physical Exam Constitutional:      General: He is not in acute distress.    Appearance: Normal appearance. He is not ill-appearing or toxic-appearing.  HENT:     Head: Normocephalic and atraumatic.     Right Ear: Tympanic membrane, ear canal and external ear normal.     Left Ear: Tympanic membrane, ear canal and external ear normal.     Nose: No congestion or rhinorrhea.  Eyes:     Extraocular Movements: Extraocular movements intact.     Pupils: Pupils are equal, round, and reactive to light.  Cardiovascular:     Rate and Rhythm: Normal rate  and regular rhythm.     Pulses: Normal pulses.     Heart sounds: Normal heart sounds. No murmur heard. Pulmonary:     Effort: Pulmonary effort is normal. No respiratory distress.     Breath sounds: Normal breath sounds. No wheezing, rhonchi or rales.  Abdominal:     General: Bowel sounds are normal.     Palpations: Abdomen is soft. There is no mass.     Tenderness: There is no abdominal tenderness. There is no guarding.     Hernia: No hernia is present.  Musculoskeletal:        General: Normal range of  motion.     Cervical back: Normal range of motion and neck supple.  Skin:    General: Skin is warm and dry.  Neurological:     Mental Status: He is alert and oriented to person, place, and time.  Psychiatric:        Behavior: Behavior normal.   BP (!) 144/80 (BP Location: Left Arm, Patient Position: Sitting)   Pulse 69   Resp 16   Wt 128 lb (58.1 kg)   SpO2 96%   BMI 20.66 kg/m  Wt Readings from Last 3 Encounters:  10/14/21 128 lb (58.1 kg)  07/15/21 127 lb 6.4 oz (57.8 kg)  12/02/20 140 lb (63.5 kg)     Lab Results  Component Value Date   WBC 5.5 08/18/2021   HGB 13.1 08/18/2021   HCT 39.0 08/18/2021   PLT 206.0 08/18/2021   GLUCOSE 88 07/15/2021   CHOL 183 07/15/2021   TRIG 60.0 07/15/2021   HDL 55.60 07/15/2021   LDLCALC 115 (H) 07/15/2021   ALT 25 07/15/2021   AST 30 07/15/2021   NA 140 07/15/2021   K 4.0 07/15/2021   CL 103 07/15/2021   CREATININE 0.73 07/15/2021   BUN 22 07/15/2021   CO2 28 07/15/2021   TSH 1.95 07/15/2021   PSA 9.89 08/28/2020   HGBA1C 5.4 07/15/2021    CT THORACIC SPINE W CONTRAST  Result Date: 07/26/2017 CLINICAL DATA:  Low back pain. RIGHT leg pain. Previous fusion for thoracic compression fracture. FLUOROSCOPY TIME:  1 minutes and 21 seconds corresponding to a Dose Area Product of 369.49 Gy*m2 PROCEDURE: LUMBAR PUNCTURE FOR THORACIC AND LUMBAR MYELOGRAM After thorough discussion of risks and benefits of the procedure including bleeding, infection, injury to nerves, blood vessels, adjacent structures as well as headache and CSF leak, written and oral informed consent was obtained. Consent was obtained by Dr. Rolla Flatten. Patient was positioned prone on the fluoroscopy table. Local anesthesia was provided with 1% lidocaine without epinephrine after prepped and draped in the usual sterile fashion. Puncture was performed at L3 using a 3 1/2 inch 22-gauge spinal needle via midline approach. Using a single pass through the dura, the needle was  placed within the thecal sac, with return of clear CSF. 10 mL of Isovue-M 300 was injected into the thecal sac, with normal opacification of the nerve roots and cauda equina consistent with free flow within the subarachnoid space. The patient was then moved to the trendelenburg position and contrast flowed into the Thoracic spine region. I personally performed the lumbar puncture and administered the intrathecal contrast. I also personally supervised acquisition of the myelogram images. TECHNIQUE: Contiguous axial images were obtained through the Thoracic and Lumbar spine after the intrathecal infusion of infusion. Coronal and sagittal reconstructions were obtained of the axial image sets. FINDINGS: THORACIC AND LUMBAR MYELOGRAM FINDINGS: LUMBAR: Good opacification lumbar subarachnoid  space. 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 Near complete loss of interspace height at L5-S1 is accompanied by functional fusion across the interspace. There is severe disc space narrowing L1-L2. Osseous spurring is accompanied by 6 mm retrolisthesis. There is asymmetric loss of interspace height to the LEFT with osseous spurring. LEFT greater than RIGHT L2 nerve root impingement is noted. There is 30 degrees of scoliosis convex RIGHT. At L4-5, there is asymmetric loss of interspace height on the RIGHT. The RIGHT L5 nerve root is effaced, partially truncated. At L3-4 there is mild stenosis. There is effacement the RIGHT L4 nerve root. At L2-3, there is asymmetric loss of interspace height on the LEFT with effacement of the LEFT L3 nerve root. Standing flexion extension radiographs were performed. In neutral, there is 8 mm anterolisthesis L5-S1 and 6 mm retrolisthesis L1-2. 2 mm retrolisthesis L2-3 is observed. This is unchanged through flexion and extension. THORACIC: Contrast was maneuvered into the thoracic subarachnoid space. There is a chronic compression fracture of T11 which patient sustained as a  young man. There is approximately 50% loss of vertebral body height anteriorly. The patient has undergone treatment for this with a laminectomy and subsequent T9 through L1 posterior fusion. This fusion is solid. There is no residual impingement of the cord. No metallic densities are seen in the spinal canal. At T8-9, there is 2 mm of degenerative anterolisthesis. There is straightening of the normal thoracic kyphosis above this, but no subluxation or significant extradural defects. No cord compression is seen. CT THORACIC MYELOGRAM FINDINGS: Segmentation: Normal. Alignment: 2 mm anterolisthesis T8-9, kyphotic deformity at T11 from old fracture. Vertebrae: No worrisome osseous lesion.T9 through L1 posterior arthrodesis is solid. Conus medullaris and distal thoracic cord: Normal in size, and location. Paraspinal tissues: No evidence for hydronephrosis or paravertebral mass. Disc levels: The individual disc spaces are examined as follows: T1-2:  Normal. T2-3:  Normal. T3-4:  Normal. T4-5:  Normal. T5-6:  Normal. T6-7:  Slight bulge. T7-8:  Osseous spurring.  No impingement. T8-9: 2 mm anterolisthesis is facet mediated. Slight stenosis without cord flattening. BILATERAL foraminal narrowing due to bony overgrowth and slip could affect the T8 nerve roots. T9-10:  Solid arthrodesis.  No impingement. T10-11: Minimal retropulsion of bone. Calcified disc. No impingement. T11-12: Slight dorsal tethering of the cord is postoperative in nature. No cord compression. T12-L1:  Unremarkable. CT LUMBAR MYELOGRAM FINDINGS: Segmentation: Normal. Alignment: Scoliosis measures approximately 23 degrees with patient recumbent for CT. There is 8 mm anterolisthesis L5, due to BILATERAL L5 pars defects. 5 mm retrolisthesis L1-2. The retrolisthesis at L2-3 is essentially normalized. Vertebrae: No worrisome osseous lesion. Conus medullaris: Normal in size and location. Dorsal tethering at T11-12 which is described above. Paraspinal tissues: No  evidence for hydronephrosis or paravertebral mass. Aortic atherosclerosis. Disc levels: L1-L2: 5 mm retrolisthesis. Annular bulge. Facet arthropathy. Marked osseous spurring to the LEFT with vacuum phenomenon. Subarticular zone and foraminal zone narrowing affects the LEFT L1 and LEFT L2 nerve roots. L2-L3: Annular bulge. Asymmetric loss of interspace height on the LEFT. Asymmetric foraminal zone narrowing on the LEFT affects the L2 nerve root. L3-L4: Central and rightward protrusion. Posterior element hypertrophy. Mild stenosis. RIGHT L4 nerve root impingement. L4-L5: Shallow central protrusion. Posterior element hypertrophy is worse on the RIGHT. Asymmetric loss of interspace height to the RIGHT. RIGHT L5 and BILATERAL L4 nerve root impingement are likely. L5-S1: 8 mm anterolisthesis. BILATERAL L5 spondylolysis. Near complete loss of interspace height with functional fusion across the  interspace. There is no definite subarticular zone or foraminal zone narrowing although mild stenosis is observed. 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 Functional fusion across the interspace. No impingement at this level. Asymmetric RIGHT-sided neural impingement is most evident at L4-5, where there is subarticular zone and foraminal zone narrowing; correlate clinically for symptomatic RIGHT L5 nerve root impingement. Similar less severe RIGHT-side changes are present at L3-4. Marked asymmetric loss of interspace height at L1-2 eccentric to the LEFT, with L1 and L2 nerve root impingement. Chronic compression deformity of T11, mildly kyphotic, with solid posterior arthrodesis T9 through L1. Mild tethering of the cord dorsally at this level is postoperative in nature. No significant thoracic cord compression. 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 No dynamic instability, but BILATERAL foraminal narrowing related to slip and bony overgrowth is observed. No significant  dynamic instability resulting in stenosis or RIGHT-sided neural impingement. Electronically Signed   By: Staci Righter M.D.   On: 07/26/2017 12:39   CT LUMBAR SPINE W CONTRAST  Result Date: 07/26/2017 CLINICAL DATA:  Low back pain. RIGHT leg pain. Previous fusion for thoracic compression fracture. FLUOROSCOPY TIME:  1 minutes and 21 seconds corresponding to a Dose Area Product of 369.49 Gy*m2 PROCEDURE: LUMBAR PUNCTURE FOR THORACIC AND LUMBAR MYELOGRAM After thorough discussion of risks and benefits of the procedure including bleeding, infection, injury to nerves, blood vessels, adjacent structures as well as headache and CSF leak, written and oral informed consent was obtained. Consent was obtained by Dr. Rolla Flatten. Patient was positioned prone on the fluoroscopy table. Local anesthesia was provided with 1% lidocaine without epinephrine after prepped and draped in the usual sterile fashion. Puncture was performed at L3 using a 3 1/2 inch 22-gauge spinal needle via midline approach. Using a single pass through the dura, the needle was placed within the thecal sac, with return of clear CSF. 10 mL of Isovue-M 300 was injected into the thecal sac, with normal opacification of the nerve roots and cauda equina consistent with free flow within the subarachnoid space. The patient was then moved to the trendelenburg position and contrast flowed into the Thoracic spine region. I personally performed the lumbar puncture and administered the intrathecal contrast. I also personally supervised acquisition of the myelogram images. TECHNIQUE: Contiguous axial images were obtained through the Thoracic and Lumbar spine after the intrathecal infusion of infusion. Coronal and sagittal reconstructions were obtained of the axial image sets. FINDINGS: THORACIC AND LUMBAR MYELOGRAM FINDINGS: LUMBAR: Good opacification lumbar subarachnoid space. 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 Near complete loss of  interspace height at L5-S1 is accompanied by functional fusion across the interspace. There is severe disc space narrowing L1-L2. Osseous spurring is accompanied by 6 mm retrolisthesis. There is asymmetric loss of interspace height to the LEFT with osseous spurring. LEFT greater than RIGHT L2 nerve root impingement is noted. There is 30 degrees of scoliosis convex RIGHT. At L4-5, there is asymmetric loss of interspace height on the RIGHT. The RIGHT L5 nerve root is effaced, partially truncated. At L3-4 there is mild stenosis. There is effacement the RIGHT L4 nerve root. At L2-3, there is asymmetric loss of interspace height on the LEFT with effacement of the LEFT L3 nerve root. Standing flexion extension radiographs were performed. In neutral, there is 8 mm anterolisthesis L5-S1 and 6 mm retrolisthesis L1-2. 2 mm retrolisthesis L2-3 is observed. This is unchanged through flexion and extension. THORACIC: Contrast was maneuvered into  the thoracic subarachnoid space. There is a chronic compression fracture of T11 which patient sustained as a young man. There is approximately 50% loss of vertebral body height anteriorly. The patient has undergone treatment for this with a laminectomy and subsequent T9 through L1 posterior fusion. This fusion is solid. There is no residual impingement of the cord. No metallic densities are seen in the spinal canal. At T8-9, there is 2 mm of degenerative anterolisthesis. There is straightening of the normal thoracic kyphosis above this, but no subluxation or significant extradural defects. No cord compression is seen. CT THORACIC MYELOGRAM FINDINGS: Segmentation: Normal. Alignment: 2 mm anterolisthesis T8-9, kyphotic deformity at T11 from old fracture. Vertebrae: No worrisome osseous lesion.T9 through L1 posterior arthrodesis is solid. Conus medullaris and distal thoracic cord: Normal in size, and location. Paraspinal tissues: No evidence for hydronephrosis or paravertebral mass. Disc  levels: The individual disc spaces are examined as follows: T1-2:  Normal. T2-3:  Normal. T3-4:  Normal. T4-5:  Normal. T5-6:  Normal. T6-7:  Slight bulge. T7-8:  Osseous spurring.  No impingement. T8-9: 2 mm anterolisthesis is facet mediated. Slight stenosis without cord flattening. BILATERAL foraminal narrowing due to bony overgrowth and slip could affect the T8 nerve roots. T9-10:  Solid arthrodesis.  No impingement. T10-11: Minimal retropulsion of bone. Calcified disc. No impingement. T11-12: Slight dorsal tethering of the cord is postoperative in nature. No cord compression. T12-L1:  Unremarkable. CT LUMBAR MYELOGRAM FINDINGS: Segmentation: Normal. Alignment: Scoliosis measures approximately 23 degrees with patient recumbent for CT. There is 8 mm anterolisthesis L5, due to BILATERAL L5 pars defects. 5 mm retrolisthesis L1-2. The retrolisthesis at L2-3 is essentially normalized. Vertebrae: No worrisome osseous lesion. Conus medullaris: Normal in size and location. Dorsal tethering at T11-12 which is described above. Paraspinal tissues: No evidence for hydronephrosis or paravertebral mass. Aortic atherosclerosis. Disc levels: L1-L2: 5 mm retrolisthesis. Annular bulge. Facet arthropathy. Marked osseous spurring to the LEFT with vacuum phenomenon. Subarticular zone and foraminal zone narrowing affects the LEFT L1 and LEFT L2 nerve roots. L2-L3: Annular bulge. Asymmetric loss of interspace height on the LEFT. Asymmetric foraminal zone narrowing on the LEFT affects the L2 nerve root. L3-L4: Central and rightward protrusion. Posterior element hypertrophy. Mild stenosis. RIGHT L4 nerve root impingement. L4-L5: Shallow central protrusion. Posterior element hypertrophy is worse on the RIGHT. Asymmetric loss of interspace height to the RIGHT. RIGHT L5 and BILATERAL L4 nerve root impingement are likely. L5-S1: 8 mm anterolisthesis. BILATERAL L5 spondylolysis. Near complete loss of interspace height with functional fusion  across the interspace. There is no definite subarticular zone or foraminal zone narrowing although mild stenosis is observed. 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 Functional fusion across the interspace. No impingement at this level. Asymmetric RIGHT-sided neural impingement is most evident at L4-5, where there is subarticular zone and foraminal zone narrowing; correlate clinically for symptomatic RIGHT L5 nerve root impingement. Similar less severe RIGHT-side changes are present at L3-4. Marked asymmetric loss of interspace height at L1-2 eccentric to the LEFT, with L1 and L2 nerve root impingement. Chronic compression deformity of T11, mildly kyphotic, with solid posterior arthrodesis T9 through L1. Mild tethering of the cord dorsally at this level is postoperative in nature. No significant thoracic cord compression. 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 No dynamic instability, but BILATERAL foraminal narrowing related to slip and bony overgrowth is observed. No significant dynamic instability resulting in stenosis or RIGHT-sided neural impingement. Electronically Signed  By: Staci Righter M.D.   On: 07/26/2017 12:39   DG MYELOGRAPHY LUMBAR INJ MULTI REGION  Result Date: 07/26/2017 CLINICAL DATA:  Low back pain. RIGHT leg pain. Previous fusion for thoracic compression fracture. FLUOROSCOPY TIME:  1 minutes and 21 seconds corresponding to a Dose Area Product of 369.49 Gy*m2 PROCEDURE: LUMBAR PUNCTURE FOR THORACIC AND LUMBAR MYELOGRAM After thorough discussion of risks and benefits of the procedure including bleeding, infection, injury to nerves, blood vessels, adjacent structures as well as headache and CSF leak, written and oral informed consent was obtained. Consent was obtained by Dr. Rolla Flatten. Patient was positioned prone on the fluoroscopy table. Local anesthesia was provided with 1% lidocaine without epinephrine after prepped and  draped in the usual sterile fashion. Puncture was performed at L3 using a 3 1/2 inch 22-gauge spinal needle via midline approach. Using a single pass through the dura, the needle was placed within the thecal sac, with return of clear CSF. 10 mL of Isovue-M 300 was injected into the thecal sac, with normal opacification of the nerve roots and cauda equina consistent with free flow within the subarachnoid space. The patient was then moved to the trendelenburg position and contrast flowed into the Thoracic spine region. I personally performed the lumbar puncture and administered the intrathecal contrast. I also personally supervised acquisition of the myelogram images. TECHNIQUE: Contiguous axial images were obtained through the Thoracic and Lumbar spine after the intrathecal infusion of infusion. Coronal and sagittal reconstructions were obtained of the axial image sets. FINDINGS: THORACIC AND LUMBAR MYELOGRAM FINDINGS: LUMBAR: Good opacification lumbar subarachnoid space. 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 Near complete loss of interspace height at L5-S1 is accompanied by functional fusion across the interspace. There is severe disc space narrowing L1-L2. Osseous spurring is accompanied by 6 mm retrolisthesis. There is asymmetric loss of interspace height to the LEFT with osseous spurring. LEFT greater than RIGHT L2 nerve root impingement is noted. There is 30 degrees of scoliosis convex RIGHT. At L4-5, there is asymmetric loss of interspace height on the RIGHT. The RIGHT L5 nerve root is effaced, partially truncated. At L3-4 there is mild stenosis. There is effacement the RIGHT L4 nerve root. At L2-3, there is asymmetric loss of interspace height on the LEFT with effacement of the LEFT L3 nerve root. Standing flexion extension radiographs were performed. In neutral, there is 8 mm anterolisthesis L5-S1 and 6 mm retrolisthesis L1-2. 2 mm retrolisthesis L2-3 is observed. This is unchanged  through flexion and extension. THORACIC: Contrast was maneuvered into the thoracic subarachnoid space. There is a chronic compression fracture of T11 which patient sustained as a young man. There is approximately 50% loss of vertebral body height anteriorly. The patient has undergone treatment for this with a laminectomy and subsequent T9 through L1 posterior fusion. This fusion is solid. There is no residual impingement of the cord. No metallic densities are seen in the spinal canal. At T8-9, there is 2 mm of degenerative anterolisthesis. There is straightening of the normal thoracic kyphosis above this, but no subluxation or significant extradural defects. No cord compression is seen. CT THORACIC MYELOGRAM FINDINGS: Segmentation: Normal. Alignment: 2 mm anterolisthesis T8-9, kyphotic deformity at T11 from old fracture. Vertebrae: No worrisome osseous lesion.T9 through L1 posterior arthrodesis is solid. Conus medullaris and distal thoracic cord: Normal in size, and location. Paraspinal tissues: No evidence for hydronephrosis or paravertebral mass. Disc levels: The individual disc spaces are examined as follows: T1-2:  Normal. T2-3:  Normal.  T3-4:  Normal. T4-5:  Normal. T5-6:  Normal. T6-7:  Slight bulge. T7-8:  Osseous spurring.  No impingement. T8-9: 2 mm anterolisthesis is facet mediated. Slight stenosis without cord flattening. BILATERAL foraminal narrowing due to bony overgrowth and slip could affect the T8 nerve roots. T9-10:  Solid arthrodesis.  No impingement. T10-11: Minimal retropulsion of bone. Calcified disc. No impingement. T11-12: Slight dorsal tethering of the cord is postoperative in nature. No cord compression. T12-L1:  Unremarkable. CT LUMBAR MYELOGRAM FINDINGS: Segmentation: Normal. Alignment: Scoliosis measures approximately 23 degrees with patient recumbent for CT. There is 8 mm anterolisthesis L5, due to BILATERAL L5 pars defects. 5 mm retrolisthesis L1-2. The retrolisthesis at L2-3 is  essentially normalized. Vertebrae: No worrisome osseous lesion. Conus medullaris: Normal in size and location. Dorsal tethering at T11-12 which is described above. Paraspinal tissues: No evidence for hydronephrosis or paravertebral mass. Aortic atherosclerosis. Disc levels: L1-L2: 5 mm retrolisthesis. Annular bulge. Facet arthropathy. Marked osseous spurring to the LEFT with vacuum phenomenon. Subarticular zone and foraminal zone narrowing affects the LEFT L1 and LEFT L2 nerve roots. L2-L3: Annular bulge. Asymmetric loss of interspace height on the LEFT. Asymmetric foraminal zone narrowing on the LEFT affects the L2 nerve root. L3-L4: Central and rightward protrusion. Posterior element hypertrophy. Mild stenosis. RIGHT L4 nerve root impingement. L4-L5: Shallow central protrusion. Posterior element hypertrophy is worse on the RIGHT. Asymmetric loss of interspace height to the RIGHT. RIGHT L5 and BILATERAL L4 nerve root impingement are likely. L5-S1: 8 mm anterolisthesis. BILATERAL L5 spondylolysis. Near complete loss of interspace height with functional fusion across the interspace. There is no definite subarticular zone or foraminal zone narrowing although mild stenosis is observed. 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 Functional fusion across the interspace. No impingement at this level. Asymmetric RIGHT-sided neural impingement is most evident at L4-5, where there is subarticular zone and foraminal zone narrowing; correlate clinically for symptomatic RIGHT L5 nerve root impingement. Similar less severe RIGHT-side changes are present at L3-4. Marked asymmetric loss of interspace height at L1-2 eccentric to the LEFT, with L1 and L2 nerve root impingement. Chronic compression deformity of T11, mildly kyphotic, with solid posterior arthrodesis T9 through L1. Mild tethering of the cord dorsally at this level is postoperative in nature. No significant thoracic cord compression.  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 No dynamic instability, but BILATERAL foraminal narrowing related to slip and bony overgrowth is observed. No significant dynamic instability resulting in stenosis or RIGHT-sided neural impingement. Electronically Signed   By: Staci Righter M.D.   On: 07/26/2017 12:39     Assessment & Plan:  Plan    No orders of the defined types were placed in this encounter.   Problem List Items Addressed This Visit     Weight loss    Weight essentially stable and he has increased his protein intake. He will increase healthy and plant based fats as well and let us know if anything changes. He reports he gets hungry and feels stronger.       HTN (hypertension)    Well controlled, no changes to meds. Encouraged heart healthy diet such as the DASH diet and exercise as tolerated.       Abnormal PSA    He follows with [facility]      Hyperglycemia    hgba1c acceptable, minimize simple carbs. Increase exercise as tolerated.        Follow-up: Return in 3 months (on 01/14/2022)  for 3-4 months f/u visit.  I, Suezanne Jacquet, acting as a scribe for Penni Homans, MD, have documented all relevent documentation on behalf of Penni Homans, MD, as directed by Penni Homans, MD while in the presence of Penni Homans, MD. DO:10/15/21.  I, Mosie Lukes, MD personally performed the services described in this documentation. All medical record entries made by the scribe were at my direction and in my presence. I have reviewed the chart and agree that the record reflects my personal performance and is accurate and complete

## 2021-10-14 NOTE — Patient Instructions (Signed)
Avoid artifical sugars like Aspartame, Neotame, Saccharin, Acesulfame-K, or Sucralose.

## 2021-10-15 NOTE — Assessment & Plan Note (Signed)
hgba1c acceptable, minimize simple carbs. Increase exercise as tolerated.  

## 2021-10-15 NOTE — Assessment & Plan Note (Signed)
He follows with [facility]

## 2021-10-15 NOTE — Assessment & Plan Note (Signed)
Weight essentially stable and he has increased his protein intake. He will increase healthy and plant based fats as well and let us know if anything changes. He reports he gets hungry and feels stronger.

## 2021-10-15 NOTE — Assessment & Plan Note (Signed)
Well controlled, no changes to meds. Encouraged heart healthy diet such as the DASH diet and exercise as tolerated.  °

## 2021-10-29 ENCOUNTER — Telehealth: Payer: Self-pay | Admitting: Family Medicine

## 2021-10-29 NOTE — Telephone Encounter (Signed)
Left message for patient to call back and schedule Medicare Annual Wellness Visit (AWV) in office.  ° °If not able to come in office, please offer to do virtually or by telephone.  Left office number and my jabber #[phone]. ° °Due for AWVI ° °Please schedule at anytime with Nurse Health Advisor. °  °

## 2021-12-02 DIAGNOSIS — R972 Elevated prostate specific antigen [PSA]: Secondary | ICD-10-CM | POA: Diagnosis not present

## 2021-12-02 LAB — PSA: PSA: 13.9

## 2021-12-09 DIAGNOSIS — R3912 Poor urinary stream: Secondary | ICD-10-CM | POA: Diagnosis not present

## 2021-12-09 DIAGNOSIS — N401 Enlarged prostate with lower urinary tract symptoms: Secondary | ICD-10-CM | POA: Diagnosis not present

## 2021-12-09 DIAGNOSIS — R972 Elevated prostate specific antigen [PSA]: Secondary | ICD-10-CM | POA: Diagnosis not present

## 2021-12-10 ENCOUNTER — Encounter: Payer: Self-pay | Admitting: Family Medicine

## 2022-01-09 ENCOUNTER — Telehealth: Payer: Self-pay | Admitting: Family Medicine

## 2022-01-09 NOTE — Telephone Encounter (Signed)
Left message for patient to call back and schedule Medicare Annual Wellness Visit (AWV) in office.  ° °If not able to come in office, please offer to do virtually or by telephone.  Left office number and my jabber #[phone]. ° °Due for AWVI ° °Please schedule at anytime with Nurse Health Advisor. °  °

## 2022-02-23 NOTE — Progress Notes (Signed)
? ?Subjective:  ? ? Patient ID: Troy Mccann, male    DOB: 04-07-1941, 81 y.o.   MRN: [id number] ? ?Chief Complaint  ?Patient presents with  ? Follow-up  ? ? ?HPI ?Patient is in today for a follow up on chronic conditions. He is doing well. Has been trying to increase his protein intake and has managed to stabilize his weight. No new complaints. No recent febrile illness or hospitalizations. He is eating well and stays active. Denies CP/palp/SOB/HA/congestion/fevers/GI or GU c/o. Taking meds as prescribed  ? ?Past Medical History:  ?Diagnosis Date  ? Abnormal PSA 01/12/2017  ? BCC (basal cell carcinoma), face 01/12/2017  ? BPH (benign prostatic hyperplasia) 04/15/2014  ? Cataract 07/09/2015  ? Hematuria 07/09/2015  ? Medicare annual wellness visit, subsequent 10/21/2014  ? Other and unspecified hyperlipidemia 04/15/2014  ? SCC (squamous cell carcinoma) 10/21/2014  ? Tick bite 2007  ? Never assessed by a physician.  ? Unspecified constipation 03/08/2013  ? ? ?History reviewed. No pertinent surgical history. ? ?Family History  ?Problem Relation Age of Onset  ? Arthritis Mother   ?     rheumatoid  ? Heart disease Mother   ?     rheumatic fever  ? Hypertension Mother   ? Heart disease Father   ?     MI  ? Dementia Father   ? Hypertension Sister   ? Dementia Brother   ? Hypertension Son   ? Obesity Son   ? Aneurysm Maternal Grandmother   ? Dementia Maternal Grandfather   ? Heart disease Paternal Grandmother   ?     MI  ? Kidney disease Paternal Grandfather   ?     bright's disease  ? ? ?Social History  ? ?Socioeconomic History  ? Marital status: Widowed  ?  Spouse name: Not on file  ? Number of children: Not on file  ? Years of education: Not on file  ? Highest education level: Not on file  ?Occupational History  ? Not on file  ?Tobacco Use  ? Smoking status: Former  ? Smokeless tobacco: Never  ?Vaping Use  ? Vaping Use: Never used  ?Substance and Sexual Activity  ? Alcohol use: Not Currently  ? Drug use: No  ? Sexual activity: Not  on file  ?Other Topics Concern  ? Not on file  ?Social History Narrative  ? Not on file  ? ?Social Determinants of Health  ? ?Financial Resource Strain: Not on file  ?Food Insecurity: Not on file  ?Transportation Needs: Not on file  ?Physical Activity: Not on file  ?Stress: Not on file  ?Social Connections: Not on file  ?Intimate Partner Violence: Not on file  ? ? ?Outpatient Medications Prior to Visit  ?Medication Sig Dispense Refill  ? Ascorbic Acid (VITAMIN C) POWD Take 333 mg by mouth daily. Emergen-C-take 1/3 packet daily.    ? B Complex-C (SUPER B COMPLEX PO) Take by mouth daily.    ? Camphor-Menthol-Methyl Sal (TIGER BALM MUSCLE RUB EX) Apply topically at bedtime.    ? Cholecalciferol (VITAMIN D-3) 1000 UNITS CAPS Take by mouth daily.    ? ibuprofen (ADVIL,MOTRIN) 200 MG tablet Take 200 mg by mouth every 6 (six) hours as needed.    ? ?No facility-administered medications prior to visit.  ? ? ?Allergies  ?Allergen Reactions  ? Yellow Jacket Venom [Bee Venom] Hives  ? ? ?Review of Systems  ?Constitutional:  Negative for fever and malaise/fatigue.  ?HENT:  Negative for congestion.   ?  Eyes:  Negative for blurred vision.  ?Respiratory:  Negative for shortness of breath.   ?Cardiovascular:  Negative for chest pain, palpitations and leg swelling.  ?Gastrointestinal:  Negative for abdominal pain, blood in stool and nausea.  ?Genitourinary:  Negative for dysuria and frequency.  ?Musculoskeletal:  Negative for falls.  ?Skin:  Negative for rash.  ?Neurological:  Negative for dizziness, loss of consciousness and headaches.  ?Endo/Heme/Allergies:  Negative for environmental allergies.  ?Psychiatric/Behavioral:  Negative for depression. The patient is not nervous/anxious.   ? ?   ?Objective:  ?  ?Physical Exam ?Constitutional:   ?   General: He is not in acute distress. ?   Appearance: Normal appearance. He is not ill-appearing or toxic-appearing.  ?HENT:  ?   Head: Normocephalic and atraumatic.  ?   Right Ear: External  ear normal.  ?   Left Ear: External ear normal.  ?   Nose: Nose normal.  ?Eyes:  ?   General:     ?   Right eye: No discharge.     ?   Left eye: No discharge.  ?Cardiovascular:  ?   Rate and Rhythm: Normal rate.  ?   Heart sounds: No murmur heard. ?Pulmonary:  ?   Effort: Pulmonary effort is normal.  ?Musculoskeletal:  ?   Cervical back: Normal range of motion.  ?Skin: ?   Findings: No rash.  ?Neurological:  ?   Mental Status: He is alert and oriented to person, place, and time.  ?Psychiatric:     ?   Behavior: Behavior normal.  ? ? ?BP 132/72   Pulse 73   Resp 20   Ht '5\' 6"'$  (1.676 m)   Wt 129 lb 6.4 oz (58.7 kg)   SpO2 95%   BMI 20.89 kg/m?  ?Wt Readings from Last 3 Encounters:  ?02/24/22 129 lb 6.4 oz (58.7 kg)  ?10/14/21 128 lb (58.1 kg)  ?07/15/21 127 lb 6.4 oz (57.8 kg)  ? ? ?Diabetic Foot Exam - Simple   ?No data filed ?  ? ?Lab Results  ?Component Value Date  ? WBC 6.1 02/25/2022  ? HGB 13.1 02/25/2022  ? HCT 39.4 02/25/2022  ? PLT 200.0 02/25/2022  ? GLUCOSE 90 02/25/2022  ? CHOL 171 02/25/2022  ? TRIG 67.0 02/25/2022  ? HDL 60.10 02/25/2022  ? [place] 97 02/25/2022  ? ALT 28 02/25/2022  ? AST 32 02/25/2022  ? NA 140 02/25/2022  ? K 3.7 02/25/2022  ? CL 103 02/25/2022  ? CREATININE 0.76 02/25/2022  ? BUN 26 (H) 02/25/2022  ? CO2 29 02/25/2022  ? TSH 1.49 02/25/2022  ? PSA 13.90 12/02/2021  ? HGBA1C 5.6 02/25/2022  ? ? ?Lab Results  ?Component Value Date  ? TSH 1.49 02/25/2022  ? ?Lab Results  ?Component Value Date  ? WBC 6.1 02/25/2022  ? HGB 13.1 02/25/2022  ? HCT 39.4 02/25/2022  ? MCV 94.6 02/25/2022  ? PLT 200.0 02/25/2022  ? ?Lab Results  ?Component Value Date  ? NA 140 02/25/2022  ? K 3.7 02/25/2022  ? CO2 29 02/25/2022  ? GLUCOSE 90 02/25/2022  ? BUN 26 (H) 02/25/2022  ? CREATININE 0.76 02/25/2022  ? BILITOT 0.9 02/25/2022  ? ALKPHOS 54 02/25/2022  ? AST 32 02/25/2022  ? ALT 28 02/25/2022  ? PROT 6.7 02/25/2022  ? ALBUMIN 4.6 02/25/2022  ? CALCIUM 9.4 02/25/2022  ? GFR 83.36 02/25/2022  ? ?Lab  Results  ?Component Value Date  ? CHOL 171 02/25/2022  ? ?  Lab Results  ?Component Value Date  ? HDL 60.10 02/25/2022  ? ?Lab Results  ?Component Value Date  ? [place] 97 02/25/2022  ? ?Lab Results  ?Component Value Date  ? TRIG 67.0 02/25/2022  ? ?Lab Results  ?Component Value Date  ? CHOLHDL 3 02/25/2022  ? ?Lab Results  ?Component Value Date  ? HGBA1C 5.6 02/25/2022  ? ? ?   ?Assessment & Plan:  ? ?Problem List Items Addressed This Visit   ? ? Weight loss - Primary  ? HTN (hypertension)  ?  Well controlled, no changes to meds. Encouraged heart healthy diet such as the DASH diet and exercise as tolerated.  ?  ?  ? Relevant Orders  ? CBC with Differential/Platelet (Completed)  ? Comprehensive metabolic panel (Completed)  ? TSH (Completed)  ? Hyperlipidemia  ?  Encourage heart healthy diet such as MIND or DASH diet, increase exercise, avoid trans fats, simple carbohydrates and processed foods, consider a krill or fish or flaxseed oil cap daily.  ?  ?  ? Relevant Orders  ? Lipid panel (Completed)  ? Preventative health care  ? Abnormal PSA  ?  follows with Dr Raynelle Bring ?  ?  ? Hyperglycemia  ?  hgba1c acceptable, minimize simple carbs. Increase exercise as tolerated.  ?  ?  ? Relevant Orders  ? Hemoglobin A1c (Completed)  ? ? ?I have discontinued Aydrian T. Both's ibuprofen. I am also having him maintain his Vitamin D-3, Camphor-Menthol-Methyl Sal (TIGER BALM MUSCLE RUB EX), Vitamin C, and B Complex-C (SUPER B COMPLEX PO). ? ?No orders of the defined types were placed in this encounter. ? ? ? ? ?

## 2022-02-24 ENCOUNTER — Encounter: Payer: Self-pay | Admitting: Family Medicine

## 2022-02-24 ENCOUNTER — Ambulatory Visit (INDEPENDENT_AMBULATORY_CARE_PROVIDER_SITE_OTHER): Payer: Medicare HMO | Admitting: Family Medicine

## 2022-02-24 VITALS — BP 132/72 | HR 73 | Resp 20 | Ht 66.0 in | Wt 129.4 lb

## 2022-02-24 DIAGNOSIS — E785 Hyperlipidemia, unspecified: Secondary | ICD-10-CM | POA: Diagnosis not present

## 2022-02-24 DIAGNOSIS — R634 Abnormal weight loss: Secondary | ICD-10-CM | POA: Diagnosis not present

## 2022-02-24 DIAGNOSIS — I10 Essential (primary) hypertension: Secondary | ICD-10-CM

## 2022-02-24 DIAGNOSIS — R972 Elevated prostate specific antigen [PSA]: Secondary | ICD-10-CM

## 2022-02-24 DIAGNOSIS — Z0000 Encounter for general adult medical examination without abnormal findings: Secondary | ICD-10-CM

## 2022-02-24 DIAGNOSIS — R739 Hyperglycemia, unspecified: Secondary | ICD-10-CM | POA: Diagnosis not present

## 2022-02-24 NOTE — Assessment & Plan Note (Signed)
Well controlled, no changes to meds. Encouraged heart healthy diet such as the DASH diet and exercise as tolerated.  °

## 2022-02-24 NOTE — Patient Instructions (Signed)
Chia seeds  ?Yellow split pea powder ?Whey powder ?Egg White Powder ?Protein powder ?Hemp Seed Powder ?Flaxseed powder ?Healthy fats in smoothie such as Flaxseed oil, Avocado Oil and or MCT (coconut) oil ?

## 2022-02-24 NOTE — Assessment & Plan Note (Signed)
follows with Dr Raynelle Bring ?

## 2022-02-24 NOTE — Assessment & Plan Note (Signed)
hgba1c acceptable, minimize simple carbs. Increase exercise as tolerated.  

## 2022-02-24 NOTE — Assessment & Plan Note (Signed)
Encourage heart healthy diet such as MIND or DASH diet, increase exercise, avoid trans fats, simple carbohydrates and processed foods, consider a krill or fish or flaxseed oil cap daily.  °

## 2022-02-25 ENCOUNTER — Other Ambulatory Visit (INDEPENDENT_AMBULATORY_CARE_PROVIDER_SITE_OTHER): Payer: Medicare HMO

## 2022-02-25 DIAGNOSIS — I10 Essential (primary) hypertension: Secondary | ICD-10-CM | POA: Diagnosis not present

## 2022-02-25 DIAGNOSIS — E785 Hyperlipidemia, unspecified: Secondary | ICD-10-CM

## 2022-02-25 DIAGNOSIS — R739 Hyperglycemia, unspecified: Secondary | ICD-10-CM

## 2022-02-25 LAB — CBC WITH DIFFERENTIAL/PLATELET
Basophils Absolute: 0.1 10*3/uL (ref 0.0–0.1)
Basophils Relative: 1.3 % (ref 0.0–3.0)
Eosinophils Absolute: 0.3 10*3/uL (ref 0.0–0.7)
Eosinophils Relative: 5.6 % — ABNORMAL HIGH (ref 0.0–5.0)
HCT: 39.4 % (ref 39.0–52.0)
Hemoglobin: 13.1 g/dL (ref 13.0–17.0)
Lymphocytes Relative: 24.7 % (ref 12.0–46.0)
Lymphs Abs: 1.5 10*3/uL (ref 0.7–4.0)
MCHC: 33.2 g/dL (ref 30.0–36.0)
MCV: 94.6 fl (ref 78.0–100.0)
Monocytes Absolute: 0.6 10*3/uL (ref 0.1–1.0)
Monocytes Relative: 9.4 % (ref 3.0–12.0)
Neutro Abs: 3.6 10*3/uL (ref 1.4–7.7)
Neutrophils Relative %: 59 % (ref 43.0–77.0)
Platelets: 200 10*3/uL (ref 150.0–400.0)
RBC: 4.17 Mil/uL — ABNORMAL LOW (ref 4.22–5.81)
RDW: 13.3 % (ref 11.5–15.5)
WBC: 6.1 10*3/uL (ref 4.0–10.5)

## 2022-02-25 LAB — COMPREHENSIVE METABOLIC PANEL
ALT: 28 U/L (ref 0–53)
AST: 32 U/L (ref 0–37)
Albumin: 4.6 g/dL (ref 3.5–5.2)
Alkaline Phosphatase: 54 U/L (ref 39–117)
BUN: 26 mg/dL — ABNORMAL HIGH (ref 6–23)
CO2: 29 mEq/L (ref 19–32)
Calcium: 9.4 mg/dL (ref 8.4–10.5)
Chloride: 103 mEq/L (ref 96–112)
Creatinine, Ser: 0.76 mg/dL (ref 0.40–1.50)
GFR: 83.36 mL/min (ref >60.00)
Glucose, Bld: 90 mg/dL (ref 70–99)
Potassium: 3.7 mEq/L (ref 3.5–5.1)
Sodium: 140 mEq/L (ref 135–145)
Total Bilirubin: 0.9 mg/dL (ref 0.2–1.2)
Total Protein: 6.7 g/dL (ref 6.0–8.3)

## 2022-02-25 LAB — LIPID PANEL
Cholesterol: 171 mg/dL (ref 0–200)
HDL: 60.1 mg/dL (ref >39.00)
LDL Cholesterol: 97 mg/dL (ref 0–99)
NonHDL: 110.69
Total CHOL/HDL Ratio: 3
Triglycerides: 67 mg/dL (ref 0.0–149.0)
VLDL: 13.4 mg/dL (ref 0.0–40.0)

## 2022-02-25 LAB — HEMOGLOBIN A1C: Hgb A1c MFr Bld: 5.6 % (ref 4.6–6.5)

## 2022-02-25 LAB — TSH: TSH: 1.49 u[IU]/mL (ref 0.35–5.50)

## 2022-03-24 DIAGNOSIS — L570 Actinic keratosis: Secondary | ICD-10-CM | POA: Diagnosis not present

## 2022-03-24 DIAGNOSIS — L821 Other seborrheic keratosis: Secondary | ICD-10-CM | POA: Diagnosis not present

## 2022-03-24 DIAGNOSIS — Z859 Personal history of malignant neoplasm, unspecified: Secondary | ICD-10-CM | POA: Diagnosis not present

## 2022-03-24 DIAGNOSIS — Z86008 Personal history of in-situ neoplasm of other site: Secondary | ICD-10-CM | POA: Diagnosis not present

## 2022-03-26 ENCOUNTER — Telehealth: Payer: Self-pay | Admitting: Family Medicine

## 2022-03-26 NOTE — Telephone Encounter (Signed)
Left message for patient to call back and schedule Medicare Annual Wellness Visit (AWV). Please offer to do virtually or by telephone.  Left office number and my jabber #[phone]. ? ?Due for AWVI ? ?Please schedule at anytime with Nurse Health Advisor. ?  ?

## 2022-06-30 ENCOUNTER — Ambulatory Visit (INDEPENDENT_AMBULATORY_CARE_PROVIDER_SITE_OTHER): Payer: Medicare HMO | Admitting: Family Medicine

## 2022-06-30 ENCOUNTER — Encounter: Payer: Self-pay | Admitting: Family Medicine

## 2022-06-30 VITALS — BP 138/70 | HR 72 | Resp 20 | Ht 66.0 in | Wt 129.0 lb

## 2022-06-30 DIAGNOSIS — I10 Essential (primary) hypertension: Secondary | ICD-10-CM | POA: Diagnosis not present

## 2022-06-30 DIAGNOSIS — R972 Elevated prostate specific antigen [PSA]: Secondary | ICD-10-CM | POA: Diagnosis not present

## 2022-06-30 DIAGNOSIS — R739 Hyperglycemia, unspecified: Secondary | ICD-10-CM

## 2022-06-30 DIAGNOSIS — E785 Hyperlipidemia, unspecified: Secondary | ICD-10-CM

## 2022-06-30 DIAGNOSIS — D649 Anemia, unspecified: Secondary | ICD-10-CM

## 2022-06-30 NOTE — Progress Notes (Signed)
Subjective:   By signing my name below, I, [name], attest that this documentation has been prepared under the direction and in the presence of Mosie Lukes, MD 06/30/2022.    Patient ID: Troy Mccann, male    DOB: Dec 19, 1940, 81 y.o.   MRN: [id number]  No chief complaint on file.  HPI Patient is in today for an office visit.  PSA: He is currently seeing a urologist to monitor his PSA.   Urination: He states the he experiences an urge to use the restroom. He worries that he will be unable to make it to the restroom in time. He states that he was prescribed a medication by his urologist to manage his symptoms but refuses to take it. He reports that his urinary stream is weak.    Diet: He states the he drinks decaffeinated coffee and mostly clear liquids.   Vision: He has an appointment scheduled for 07/2022 with his optometrist Dr. Gavin Potters, OD, to have his vision examined.   Past Medical History:  Diagnosis Date   Abnormal PSA 01/12/2017   BCC (basal cell carcinoma), face 01/12/2017   BPH (benign prostatic hyperplasia) 04/15/2014   Cataract 07/09/2015   Hematuria 07/09/2015   Medicare annual wellness visit, subsequent 10/21/2014   Other and unspecified hyperlipidemia 04/15/2014   SCC (squamous cell carcinoma) 10/21/2014   Tick bite 2007   Never assessed by a physician.   Unspecified constipation 03/08/2013   No past surgical history on file.  Family History  Problem Relation Age of Onset   Arthritis Mother        rheumatoid   Heart disease Mother        rheumatic fever   Hypertension Mother    Heart disease Father        MI   Dementia Father    Hypertension Sister    Dementia Brother    Hypertension Son    Obesity Son    Aneurysm Maternal Grandmother    Dementia Maternal Grandfather    Heart disease Paternal Grandmother        MI   Kidney disease Paternal Grandfather        bright's disease   Social History   Socioeconomic History   Marital status:  Widowed    Spouse name: Not on file   Number of children: Not on file   Years of education: Not on file   Highest education level: Not on file  Occupational History   Not on file  Tobacco Use   Smoking status: Former   Smokeless tobacco: Never  Vaping Use   Vaping Use: Never used  Substance and Sexual Activity   Alcohol use: Not Currently   Drug use: No   Sexual activity: Not on file  Other Topics Concern   Not on file  Social History Narrative   Not on file   Social Determinants of Health   Financial Resource Strain: Not on file  Food Insecurity: Not on file  Transportation Needs: Not on file  Physical Activity: Not on file  Stress: Not on file  Social Connections: Not on file  Intimate Partner Violence: Not on file   Outpatient Medications Prior to Visit  Medication Sig Dispense Refill   Ascorbic Acid (VITAMIN C) POWD Take 333 mg by mouth daily. Emergen-C-take 1/3 packet daily.     B Complex-C (SUPER B COMPLEX PO) Take by mouth daily.     Camphor-Menthol-Methyl Sal (TIGER BALM MUSCLE RUB EX) Apply topically at  bedtime.     Cholecalciferol (VITAMIN D-3) 1000 UNITS CAPS Take by mouth daily.     No facility-administered medications prior to visit.   Allergies  Allergen Reactions   Yellow Jacket Venom [Bee Venom] Hives   ROS    Objective:    Physical Exam Constitutional:      General: He is not in acute distress.    Appearance: Normal appearance. He is not ill-appearing.  HENT:     Head: Normocephalic and atraumatic.     Right Ear: External ear normal.     Left Ear: External ear normal.  Eyes:     Extraocular Movements: Extraocular movements intact.     Pupils: Pupils are equal, round, and reactive to light.  Cardiovascular:     Rate and Rhythm: Normal rate and regular rhythm.     Pulses: Normal pulses.     Heart sounds: Normal heart sounds. No murmur heard.    No gallop.     Comments: (-) bilateral pedal edema Pulmonary:     Effort: Pulmonary effort is  normal. No respiratory distress.     Breath sounds: Normal breath sounds. No wheezing or rales.  Abdominal:     General: Bowel sounds are normal.  Skin:    General: Skin is warm and dry.  Neurological:     Mental Status: He is alert and oriented to person, place, and time.  Psychiatric:        Mood and Affect: Mood normal.        Behavior: Behavior normal.        Judgment: Judgment normal.    There were no vitals taken for this visit. Wt Readings from Last 3 Encounters:  02/24/22 129 lb 6.4 oz (58.7 kg)  10/14/21 128 lb (58.1 kg)  07/15/21 127 lb 6.4 oz (57.8 kg)   Diabetic Foot Exam - Simple   No data filed    Lab Results  Component Value Date   WBC 6.1 02/25/2022   HGB 13.1 02/25/2022   HCT 39.4 02/25/2022   PLT 200.0 02/25/2022   GLUCOSE 90 02/25/2022   CHOL 171 02/25/2022   TRIG 67.0 02/25/2022   HDL 60.10 02/25/2022   LDLCALC 97 02/25/2022   ALT 28 02/25/2022   AST 32 02/25/2022   NA 140 02/25/2022   K 3.7 02/25/2022   CL 103 02/25/2022   CREATININE 0.76 02/25/2022   BUN 26 (H) 02/25/2022   CO2 29 02/25/2022   TSH 1.49 02/25/2022   PSA 13.90 12/02/2021   HGBA1C 5.6 02/25/2022   Lab Results  Component Value Date   TSH 1.49 02/25/2022   Lab Results  Component Value Date   WBC 6.1 02/25/2022   HGB 13.1 02/25/2022   HCT 39.4 02/25/2022   MCV 94.6 02/25/2022   PLT 200.0 02/25/2022   Lab Results  Component Value Date   NA 140 02/25/2022   K 3.7 02/25/2022   CO2 29 02/25/2022   GLUCOSE 90 02/25/2022   BUN 26 (H) 02/25/2022   CREATININE 0.76 02/25/2022   BILITOT 0.9 02/25/2022   ALKPHOS 54 02/25/2022   AST 32 02/25/2022   ALT 28 02/25/2022   PROT 6.7 02/25/2022   ALBUMIN 4.6 02/25/2022   CALCIUM 9.4 02/25/2022   GFR 83.36 02/25/2022   Lab Results  Component Value Date   CHOL 171 02/25/2022   Lab Results  Component Value Date   HDL 60.10 02/25/2022   Lab Results  Component Value Date   LDLCALC 97 02/25/2022  Lab Results  Component  Value Date   TRIG 67.0 02/25/2022   Lab Results  Component Value Date   CHOLHDL 3 02/25/2022   Lab Results  Component Value Date   HGBA1C 5.6 02/25/2022      Assessment & Plan:   Problem List Items Addressed This Visit       Cardiovascular and Mediastinum   HTN (hypertension) - Primary     Other   Hyperlipidemia   Hyperglycemia   Other Visit Diagnoses     Anemia, unspecified type          No orders of the defined types were placed in this encounter.  I, [name], personally preformed the services described in this documentation.  All medical record entries made by the scribe were at my direction and in my presence.  I have reviewed the chart and discharge instructions (if applicable) and agree that the record reflects my personal performance and is accurate and complete. 06/30/2022.  I,Mohammed Iqbal,acting as a scribe for Penni Homans, MD.,have documented all relevant documentation on the behalf of Penni Homans, MD,as directed by  Penni Homans, MD while in the presence of Penni Homans, MD.  [name]

## 2022-06-30 NOTE — Patient Instructions (Addendum)
Yerba Matte tea do not drink Benign Prostatic Hyperplasia  Benign prostatic hyperplasia (BPH) is an enlarged prostate gland that is caused by the normal aging process. The prostate may get bigger as a man gets older. The condition is not caused by cancer. The prostate is a walnut-sized gland that is involved in the production of semen. It is located in front of the rectum and below the bladder. The bladder stores urine. The urethra carries stored urine out of the body. An enlarged prostate can press on the urethra. This can make it harder to pass urine. The buildup of urine in the bladder can cause infection. Back pressure and infection may progress to bladder damage and kidney (renal) failure. What are the causes? This condition is part of the normal aging process. However, not all men develop problems from this condition. If the prostate enlarges away from the urethra, urine flow will not be blocked. If it enlarges toward the urethra and compresses it, there will be problems passing urine. What increases the risk? This condition is more likely to develop in men older than 50 years. What are the signs or symptoms? Symptoms of this condition include: Getting up often during the night to urinate. Needing to urinate frequently during the day. Difficulty starting urine flow. Decrease in size and strength of your urine stream. Leaking (dribbling) after urinating. Inability to pass urine. This needs immediate treatment. Inability to completely empty your bladder. Pain when you pass urine. This is more common if there is also an infection. Urinary tract infection (UTI). How is this diagnosed? This condition is diagnosed based on your medical history, a physical exam, and your symptoms. Tests will also be done, such as: A post-void bladder scan. This measures any amount of urine that may remain in your bladder after you finish urinating. A digital rectal exam. In a rectal exam, your health care  provider checks your prostate by putting a lubricated, gloved finger into your rectum to feel the back of your prostate gland. This exam detects the size of your gland and any abnormal lumps or growths. An exam of your urine (urinalysis). A prostate specific antigen (PSA) screening. This is a blood test used to screen for prostate cancer. An ultrasound. This test uses sound waves to electronically produce a picture of your prostate gland. Your health care provider may refer you to a specialist in kidney and prostate diseases (urologist). How is this treated? Once symptoms begin, your health care provider will monitor your condition (active surveillance or watchful waiting). Treatment for this condition will depend on the severity of your condition. Treatment may include: Observation and yearly exams. This may be the only treatment needed if your condition and symptoms are mild. Medicines to relieve your symptoms, including: Medicines to shrink the prostate. Medicines to relax the muscle of the prostate. Surgery in severe cases. Surgery may include: Prostatectomy. In this procedure, the prostate tissue is removed completely through an open incision or with a laparoscope or robotics. Transurethral resection of the prostate (TURP). In this procedure, a tool is inserted through the opening at the tip of the penis (urethra). It is used to cut away tissue of the inner core of the prostate. The pieces are removed through the same opening of the penis. This removes the blockage. Transurethral incision (TUIP). In this procedure, small cuts are made in the prostate. This lessens the prostate's pressure on the urethra. Transurethral microwave thermotherapy (TUMT). This procedure uses microwaves to create heat. The heat destroys  and removes a small amount of prostate tissue. Transurethral needle ablation (TUNA). This procedure uses radio frequencies to destroy and remove a small amount of prostate  tissue. Interstitial laser coagulation (Dent). This procedure uses a laser to destroy and remove a small amount of prostate tissue. Transurethral electrovaporization (TUVP). This procedure uses electrodes to destroy and remove a small amount of prostate tissue. Prostatic urethral lift. This procedure inserts an implant to push the lobes of the prostate away from the urethra. Follow these instructions at home: Take over-the-counter and prescription medicines only as told by your health care provider. Monitor your symptoms for any changes. Contact your health care provider with any changes. Avoid drinking large amounts of liquid before going to bed or out in public. Avoid or reduce how much caffeine or alcohol you drink. Give yourself time when you urinate. Keep all follow-up visits. This is important. Contact a health care provider if: You have unexplained back pain. Your symptoms do not get better with treatment. You develop side effects from the medicine you are taking. Your urine becomes very dark or has a bad smell. Your lower abdomen becomes distended and you have trouble passing urine. Get help right away if: You have a fever or chills. You suddenly cannot urinate. You feel light-headed or very dizzy, or you faint. There are large amounts of blood or clots in your urine. Your urinary problems become hard to manage. You develop moderate to severe low back or flank pain. The flank is the side of your body between the ribs and the hip. These symptoms may be an emergency. Get help right away. Call 911. Do not wait to see if the symptoms will go away. Do not drive yourself to the hospital. Summary Benign prostatic hyperplasia (BPH) is an enlarged prostate that is caused by the normal aging process. It is not caused by cancer. An enlarged prostate can press on the urethra. This can make it hard to pass urine. This condition is more likely to develop in men older than 50 years. Get help  right away if you suddenly cannot urinate. This information is not intended to replace advice given to you by your health care provider. Make sure you discuss any questions you have with your health care provider. Document Revised: 06/11/2021 Document Reviewed: 06/11/2021 Elsevier Patient Education  [employer].

## 2022-07-01 NOTE — Assessment & Plan Note (Signed)
He does note some persistent urinary hesitation he is advised to follow up with urology if worsens.

## 2022-07-01 NOTE — Assessment & Plan Note (Signed)
hgba1c acceptable, minimize simple carbs. Increase exercise as tolerated.  

## 2022-07-01 NOTE — Assessment & Plan Note (Signed)
Encourage heart healthy diet such as MIND or DASH diet, increase exercise, avoid trans fats, simple carbohydrates and processed foods, consider a krill or fish or flaxseed oil cap daily. After discussion we will wait to repeat labs at next visit

## 2022-07-01 NOTE — Assessment & Plan Note (Signed)
Well controlled, no changes to meds. Encouraged heart healthy diet such as the DASH diet and exercise as tolerated.  °

## 2022-07-14 DIAGNOSIS — R972 Elevated prostate specific antigen [PSA]: Secondary | ICD-10-CM | POA: Diagnosis not present

## 2022-07-21 DIAGNOSIS — R972 Elevated prostate specific antigen [PSA]: Secondary | ICD-10-CM | POA: Diagnosis not present

## 2022-07-21 DIAGNOSIS — N401 Enlarged prostate with lower urinary tract symptoms: Secondary | ICD-10-CM | POA: Diagnosis not present

## 2022-07-21 DIAGNOSIS — R3912 Poor urinary stream: Secondary | ICD-10-CM | POA: Diagnosis not present

## 2022-08-03 DIAGNOSIS — H2513 Age-related nuclear cataract, bilateral: Secondary | ICD-10-CM | POA: Diagnosis not present

## 2022-08-03 DIAGNOSIS — Z0100 Encounter for examination of eyes and vision without abnormal findings: Secondary | ICD-10-CM | POA: Diagnosis not present

## 2022-09-22 DIAGNOSIS — C4441 Basal cell carcinoma of skin of scalp and neck: Secondary | ICD-10-CM | POA: Diagnosis not present

## 2022-09-22 DIAGNOSIS — D225 Melanocytic nevi of trunk: Secondary | ICD-10-CM | POA: Diagnosis not present

## 2022-09-22 DIAGNOSIS — Z129 Encounter for screening for malignant neoplasm, site unspecified: Secondary | ICD-10-CM | POA: Diagnosis not present

## 2022-09-22 DIAGNOSIS — L821 Other seborrheic keratosis: Secondary | ICD-10-CM | POA: Diagnosis not present

## 2022-09-22 DIAGNOSIS — L570 Actinic keratosis: Secondary | ICD-10-CM | POA: Diagnosis not present

## 2022-09-22 DIAGNOSIS — Z859 Personal history of malignant neoplasm, unspecified: Secondary | ICD-10-CM | POA: Diagnosis not present

## 2022-09-22 DIAGNOSIS — D485 Neoplasm of uncertain behavior of skin: Secondary | ICD-10-CM | POA: Diagnosis not present

## 2022-10-01 ENCOUNTER — Ambulatory Visit (INDEPENDENT_AMBULATORY_CARE_PROVIDER_SITE_OTHER): Payer: Medicare HMO | Admitting: Family Medicine

## 2022-10-01 VITALS — BP 128/70 | HR 74 | Temp 98.0°F | Resp 16 | Ht 66.0 in | Wt 129.6 lb

## 2022-10-01 DIAGNOSIS — G8929 Other chronic pain: Secondary | ICD-10-CM | POA: Diagnosis not present

## 2022-10-01 DIAGNOSIS — M549 Dorsalgia, unspecified: Secondary | ICD-10-CM | POA: Diagnosis not present

## 2022-10-01 DIAGNOSIS — E785 Hyperlipidemia, unspecified: Secondary | ICD-10-CM

## 2022-10-01 DIAGNOSIS — I10 Essential (primary) hypertension: Secondary | ICD-10-CM

## 2022-10-01 DIAGNOSIS — R739 Hyperglycemia, unspecified: Secondary | ICD-10-CM

## 2022-10-01 NOTE — Progress Notes (Signed)
Subjective:   By signing my name below, I, [name], attest that this documentation has been prepared under the direction and in the presence of Mosie Lukes, MD., 10/01/2022.     Patient ID: Troy Mccann, male    DOB: 05-Jul-1941, 81 y.o.   MRN: [id number]  Chief Complaint  Patient presents with   Follow-up    Here for follow up   HPI Patient is in today for an office visit.  Dermatology: He reports that he recently saw his dermatologist who froze and biopsied growths on the top of his head and the pinna of both ears.  Immunizations: He has been informed about receiving COVID-19, high-dose Flu, RSV, Shingles, and Tetanus immunizations.  Vision: He states that he recently had his vision checked and is wearing new progressive glasses.   Past Medical History:  Diagnosis Date   Abnormal PSA 01/12/2017   BCC (basal cell carcinoma), face 01/12/2017   BPH (benign prostatic hyperplasia) 04/15/2014   Cataract 07/09/2015   Hematuria 07/09/2015   Medicare annual wellness visit, subsequent 10/21/2014   Other and unspecified hyperlipidemia 04/15/2014   SCC (squamous cell carcinoma) 10/21/2014   Tick bite 2007   Never assessed by a physician.   Unspecified constipation 03/08/2013   No past surgical history on file.  Family History  Problem Relation Age of Onset   Arthritis Mother        rheumatoid   Heart disease Mother        rheumatic fever   Hypertension Mother    Heart disease Father        MI   Dementia Father    Hypertension Sister    Dementia Brother    Hypertension Son    Obesity Son    Aneurysm Maternal Grandmother    Dementia Maternal Grandfather    Heart disease Paternal Grandmother        MI   Kidney disease Paternal Grandfather        bright's disease   Social History   Socioeconomic History   Marital status: Widowed    Spouse name: Not on file   Number of children: Not on file   Years of education: Not on file   Highest education level: Not on file   Occupational History   Not on file  Tobacco Use   Smoking status: Former   Smokeless tobacco: Never  Vaping Use   Vaping Use: Never used  Substance and Sexual Activity   Alcohol use: Not Currently   Drug use: No   Sexual activity: Not on file  Other Topics Concern   Not on file  Social History Narrative   Not on file   Social Determinants of Health   Financial Resource Strain: Not on file  Food Insecurity: Not on file  Transportation Needs: Not on file  Physical Activity: Not on file  Stress: Not on file  Social Connections: Not on file  Intimate Partner Violence: Not on file   Outpatient Medications Prior to Visit  Medication Sig Dispense Refill   Ascorbic Acid (VITAMIN C) POWD Take 333 mg by mouth daily. Emergen-C-take 1/3 packet daily.     B Complex-C (SUPER B COMPLEX PO) Take by mouth daily.     Camphor-Menthol-Methyl Sal (TIGER BALM MUSCLE RUB EX) Apply topically at bedtime.     Cholecalciferol (VITAMIN D-3) 1000 UNITS CAPS Take by mouth daily.     No facility-administered medications prior to visit.   Allergies  Allergen Reactions  Yellow Jacket Venom [Bee Venom] Hives   ROS    Objective:    Physical Exam Constitutional:      General: He is not in acute distress.    Appearance: Normal appearance. He is not ill-appearing.  HENT:     Head: Normocephalic and atraumatic.     Right Ear: External ear normal.     Left Ear: External ear normal.     Mouth/Throat:     Mouth: Mucous membranes are moist.     Pharynx: Oropharynx is clear.  Eyes:     Extraocular Movements: Extraocular movements intact.     Pupils: Pupils are equal, round, and reactive to light.  Cardiovascular:     Rate and Rhythm: Normal rate and regular rhythm.     Pulses: Normal pulses.     Heart sounds: Normal heart sounds. No murmur heard.    No gallop.  Pulmonary:     Effort: Pulmonary effort is normal. No respiratory distress.     Breath sounds: Normal breath sounds. No wheezing or  rales.  Abdominal:     General: Bowel sounds are normal.  Skin:    General: Skin is warm and dry.  Neurological:     Mental Status: He is alert and oriented to person, place, and time.  Psychiatric:        Mood and Affect: Mood normal.        Behavior: Behavior normal.        Judgment: Judgment normal.    BP 128/70 (BP Location: Right Arm, Patient Position: Sitting, Cuff Size: Normal)   Pulse 74   Temp 98 F (36.7 C) (Oral)   Resp 16   Ht '5\' 6"'$  (1.676 m)   Wt 129 lb 9.6 oz (58.8 kg)   SpO2 95%   BMI 20.92 kg/m  Wt Readings from Last 3 Encounters:  10/01/22 129 lb 9.6 oz (58.8 kg)  06/30/22 129 lb (58.5 kg)  02/24/22 129 lb 6.4 oz (58.7 kg)   Diabetic Foot Exam - Simple   No data filed    Lab Results  Component Value Date   WBC 5.4 10/01/2022   HGB 12.7 (L) 10/01/2022   HCT 38.3 (L) 10/01/2022   PLT 204.0 10/01/2022   GLUCOSE 86 10/01/2022   CHOL 170 10/01/2022   TRIG 72.0 10/01/2022   HDL 55.70 10/01/2022   LDLCALC 100 (H) 10/01/2022   ALT 28 10/01/2022   AST 36 10/01/2022   NA 138 10/01/2022   K 4.0 10/01/2022   CL 103 10/01/2022   CREATININE 0.66 10/01/2022   BUN 25 (H) 10/01/2022   CO2 29 10/01/2022   TSH 1.29 10/01/2022   PSA 13.90 12/02/2021   HGBA1C 5.5 10/01/2022   Lab Results  Component Value Date   TSH 1.29 10/01/2022   Lab Results  Component Value Date   WBC 5.4 10/01/2022   HGB 12.7 (L) 10/01/2022   HCT 38.3 (L) 10/01/2022   MCV 93.8 10/01/2022   PLT 204.0 10/01/2022   Lab Results  Component Value Date   NA 138 10/01/2022   K 4.0 10/01/2022   CO2 29 10/01/2022   GLUCOSE 86 10/01/2022   BUN 25 (H) 10/01/2022   CREATININE 0.66 10/01/2022   BILITOT 0.8 10/01/2022   ALKPHOS 50 10/01/2022   AST 36 10/01/2022   ALT 28 10/01/2022   PROT 6.9 10/01/2022   ALBUMIN 4.5 10/01/2022   CALCIUM 9.5 10/01/2022   GFR 86.62 10/01/2022   Lab Results  Component Value Date  CHOL 170 10/01/2022   Lab Results  Component Value Date   HDL  55.70 10/01/2022   Lab Results  Component Value Date   LDLCALC 100 (H) 10/01/2022   Lab Results  Component Value Date   TRIG 72.0 10/01/2022   Lab Results  Component Value Date   CHOLHDL 3 10/01/2022   Lab Results  Component Value Date   HGBA1C 5.5 10/01/2022      Assessment & Plan:   Problem List Items Addressed This Visit     Chronic back pain    Encouraged moist heat and gentle stretching as tolerated. May try NSAIDs and prescription meds as directed and report if symptoms worsen or seek immediate care. Continue to stay as active as able      HTN (hypertension) - Primary    Well controlled, no changes to meds. Encouraged heart healthy diet such as the DASH diet and exercise as tolerated.       Relevant Orders   CBC (Completed)   TSH (Completed)   Hyperlipidemia    Encourage heart healthy diet such as MIND or DASH diet, increase exercise, avoid trans fats, simple carbohydrates and processed foods, consider a krill or fish or flaxseed oil cap daily.       Relevant Orders   Comprehensive metabolic panel (Completed)   Lipid panel (Completed)   Hyperglycemia    hgba1c acceptable, minimize simple carbs. Increase exercise as tolerated.      Relevant Orders   Hemoglobin A1c (Completed)   No orders of the defined types were placed in this encounter.  I, Penni Homans, MD, personally preformed the services described in this documentation.  All medical record entries made by the scribe were at my direction and in my presence.  I have reviewed the chart and discharge instructions (if applicable) and agree that the record reflects my personal performance and is accurate and complete.  10/01/2022  I,Mohammed Iqbal,acting as a scribe for Penni Homans, MD.,have documented all relevant documentation on the behalf of Penni Homans, MD,as directed by  Penni Homans, MD while in the presence of Penni Homans, MD.  Penni Homans, MD

## 2022-10-01 NOTE — Patient Instructions (Addendum)
Get tetanus shot at pharmacy  Arexvy, RSV vaccination at pharmacy  Shingrix is the new shingles shot, 2 shots over 2-6 months, confirm coverage with insurance and document, then can return here for shots with nurse appt or at pharmacy

## 2022-10-02 ENCOUNTER — Other Ambulatory Visit (INDEPENDENT_AMBULATORY_CARE_PROVIDER_SITE_OTHER): Payer: Medicare HMO

## 2022-10-02 DIAGNOSIS — D649 Anemia, unspecified: Secondary | ICD-10-CM | POA: Diagnosis not present

## 2022-10-02 LAB — COMPREHENSIVE METABOLIC PANEL
ALT: 28 U/L (ref 0–53)
AST: 36 U/L (ref 0–37)
Albumin: 4.5 g/dL (ref 3.5–5.2)
Alkaline Phosphatase: 50 U/L (ref 39–117)
BUN: 25 mg/dL — ABNORMAL HIGH (ref 6–23)
CO2: 29 mEq/L (ref 19–32)
Calcium: 9.5 mg/dL (ref 8.4–10.5)
Chloride: 103 mEq/L (ref 96–112)
Creatinine, Ser: 0.66 mg/dL (ref 0.40–1.50)
GFR: 86.62 mL/min (ref >60.00)
Glucose, Bld: 86 mg/dL (ref 70–99)
Potassium: 4 mEq/L (ref 3.5–5.1)
Sodium: 138 mEq/L (ref 135–145)
Total Bilirubin: 0.8 mg/dL (ref 0.2–1.2)
Total Protein: 6.9 g/dL (ref 6.0–8.3)

## 2022-10-02 LAB — CBC
HCT: 38.3 % — ABNORMAL LOW (ref 39.0–52.0)
Hemoglobin: 12.7 g/dL — ABNORMAL LOW (ref 13.0–17.0)
MCHC: 33.3 g/dL (ref 30.0–36.0)
MCV: 93.8 fl (ref 78.0–100.0)
Platelets: 204 10*3/uL (ref 150.0–400.0)
RBC: 4.08 Mil/uL — ABNORMAL LOW (ref 4.22–5.81)
RDW: 13.6 % (ref 11.5–15.5)
WBC: 5.4 10*3/uL (ref 4.0–10.5)

## 2022-10-02 LAB — HEMOGLOBIN A1C: Hgb A1c MFr Bld: 5.5 % (ref 4.6–6.5)

## 2022-10-02 LAB — LIPID PANEL
Cholesterol: 170 mg/dL (ref 0–200)
HDL: 55.7 mg/dL (ref >39.00)
LDL Cholesterol: 100 mg/dL — ABNORMAL HIGH (ref 0–99)
NonHDL: 114.34
Total CHOL/HDL Ratio: 3
Triglycerides: 72 mg/dL (ref 0.0–149.0)
VLDL: 14.4 mg/dL (ref 0.0–40.0)

## 2022-10-02 LAB — VITAMIN B12: Vitamin B-12: 633 pg/mL (ref 211–911)

## 2022-10-02 LAB — TSH: TSH: 1.29 u[IU]/mL (ref 0.35–5.50)

## 2022-10-03 NOTE — Assessment & Plan Note (Signed)
hgba1c acceptable, minimize simple carbs. Increase exercise as tolerated.  

## 2022-10-03 NOTE — Assessment & Plan Note (Signed)
Encourage heart healthy diet such as MIND or DASH diet, increase exercise, avoid trans fats, simple carbohydrates and processed foods, consider a krill or fish or flaxseed oil cap daily.  °

## 2022-10-03 NOTE — Assessment & Plan Note (Signed)
Encouraged moist heat and gentle stretching as tolerated. May try NSAIDs and prescription meds as directed and report if symptoms worsen or seek immediate care. Continue to stay as active as able

## 2022-10-03 NOTE — Assessment & Plan Note (Signed)
Well controlled, no changes to meds. Encouraged heart healthy diet such as the DASH diet and exercise as tolerated.  °

## 2022-10-07 ENCOUNTER — Other Ambulatory Visit: Payer: Self-pay

## 2022-10-07 DIAGNOSIS — D649 Anemia, unspecified: Secondary | ICD-10-CM

## 2022-10-07 DIAGNOSIS — I10 Essential (primary) hypertension: Secondary | ICD-10-CM

## 2022-10-26 ENCOUNTER — Other Ambulatory Visit (INDEPENDENT_AMBULATORY_CARE_PROVIDER_SITE_OTHER): Payer: Medicare HMO

## 2022-10-26 DIAGNOSIS — I10 Essential (primary) hypertension: Secondary | ICD-10-CM

## 2022-10-26 DIAGNOSIS — D649 Anemia, unspecified: Secondary | ICD-10-CM

## 2022-10-26 LAB — CBC WITH DIFFERENTIAL/PLATELET
Basophils Absolute: 0.1 10*3/uL (ref 0.0–0.1)
Basophils Relative: 1.1 % (ref 0.0–3.0)
Eosinophils Absolute: 0.3 10*3/uL (ref 0.0–0.7)
Eosinophils Relative: 4.6 % (ref 0.0–5.0)
HCT: 39.9 % (ref 39.0–52.0)
Hemoglobin: 13.4 g/dL (ref 13.0–17.0)
Lymphocytes Relative: 22 % (ref 12.0–46.0)
Lymphs Abs: 1.4 10*3/uL (ref 0.7–4.0)
MCHC: 33.4 g/dL (ref 30.0–36.0)
MCV: 94.1 fl (ref 78.0–100.0)
Monocytes Absolute: 0.5 10*3/uL (ref 0.1–1.0)
Monocytes Relative: 8 % (ref 3.0–12.0)
Neutro Abs: 4.1 10*3/uL (ref 1.4–7.7)
Neutrophils Relative %: 64.3 % (ref 43.0–77.0)
Platelets: 223 10*3/uL (ref 150.0–400.0)
RBC: 4.24 Mil/uL (ref 4.22–5.81)
RDW: 13.1 % (ref 11.5–15.5)
WBC: 6.4 10*3/uL (ref 4.0–10.5)

## 2022-10-27 ENCOUNTER — Encounter: Payer: Medicare HMO | Admitting: Family Medicine

## 2022-10-31 LAB — VITAMIN B1: Vitamin B1 (Thiamine): 18 nmol/L (ref 8–30)

## 2023-01-26 DIAGNOSIS — R972 Elevated prostate specific antigen [PSA]: Secondary | ICD-10-CM | POA: Diagnosis not present

## 2023-02-03 DIAGNOSIS — N401 Enlarged prostate with lower urinary tract symptoms: Secondary | ICD-10-CM | POA: Diagnosis not present

## 2023-02-03 DIAGNOSIS — N402 Nodular prostate without lower urinary tract symptoms: Secondary | ICD-10-CM | POA: Diagnosis not present

## 2023-02-03 DIAGNOSIS — R972 Elevated prostate specific antigen [PSA]: Secondary | ICD-10-CM | POA: Diagnosis not present

## 2023-02-03 DIAGNOSIS — R3912 Poor urinary stream: Secondary | ICD-10-CM | POA: Diagnosis not present

## 2023-03-23 DIAGNOSIS — L570 Actinic keratosis: Secondary | ICD-10-CM | POA: Diagnosis not present

## 2023-03-23 DIAGNOSIS — Z859 Personal history of malignant neoplasm, unspecified: Secondary | ICD-10-CM | POA: Diagnosis not present

## 2023-03-23 DIAGNOSIS — D225 Melanocytic nevi of trunk: Secondary | ICD-10-CM | POA: Diagnosis not present

## 2023-03-23 DIAGNOSIS — Z129 Encounter for screening for malignant neoplasm, site unspecified: Secondary | ICD-10-CM | POA: Diagnosis not present

## 2023-03-23 DIAGNOSIS — Z86008 Personal history of in-situ neoplasm of other site: Secondary | ICD-10-CM | POA: Diagnosis not present

## 2023-03-23 DIAGNOSIS — D1801 Hemangioma of skin and subcutaneous tissue: Secondary | ICD-10-CM | POA: Diagnosis not present

## 2023-03-23 DIAGNOSIS — L821 Other seborrheic keratosis: Secondary | ICD-10-CM | POA: Diagnosis not present

## 2023-03-23 DIAGNOSIS — L817 Pigmented purpuric dermatosis: Secondary | ICD-10-CM | POA: Diagnosis not present

## 2023-04-06 ENCOUNTER — Encounter: Payer: Self-pay | Admitting: Family Medicine

## 2023-04-06 ENCOUNTER — Ambulatory Visit (INDEPENDENT_AMBULATORY_CARE_PROVIDER_SITE_OTHER): Payer: Medicare HMO | Admitting: Family Medicine

## 2023-04-06 VITALS — BP 138/78 | HR 84 | Temp 98.0°F | Resp 16 | Ht 62.0 in | Wt 126.0 lb

## 2023-04-06 DIAGNOSIS — R739 Hyperglycemia, unspecified: Secondary | ICD-10-CM

## 2023-04-06 DIAGNOSIS — Z0000 Encounter for general adult medical examination without abnormal findings: Secondary | ICD-10-CM | POA: Diagnosis not present

## 2023-04-06 DIAGNOSIS — R634 Abnormal weight loss: Secondary | ICD-10-CM | POA: Diagnosis not present

## 2023-04-06 DIAGNOSIS — I10 Essential (primary) hypertension: Secondary | ICD-10-CM | POA: Diagnosis not present

## 2023-04-06 DIAGNOSIS — E785 Hyperlipidemia, unspecified: Secondary | ICD-10-CM

## 2023-04-06 NOTE — Assessment & Plan Note (Signed)
Patient encouraged to maintain heart healthy diet, regular exercise, adequate sleep. Consider daily probiotics. Take medications as prescribed. Labs ordered and reviewed. Given and reviewed copy of ACP documents from [employer] and encouraged to complete and return  Aged out of screening colonoscopy

## 2023-04-06 NOTE — Progress Notes (Signed)
Subjective:   By signing my name below, I, Vickey Sages, attest that this documentation has been prepared under the direction and in the presence of Bradd Canary, MD., 04/06/2023.   Patient ID: Troy Mccann, male    DOB: 06/06/1941, 82 y.o.   MRN: [id number]  No chief complaint on file.  HPI Patient is in today for a comprehensive physical exam and follow up on chronic medical concerns. He denies CP/palpitations/SOB/HA/fever/chills/GI or GU symptoms.  Exercise Patient says that he stays active by gardening and doing push ups every night.  Sleep Patient states that he is sleeping well and between 7-8 hours nightly.  Supplements Patient states that he is taking multivitamins daily but is inquiring whether or not he needs to take additional vitamin B12. His vitamin B12 level was last checked on 10/02/2022 at 633 pg/mL which is appropriate.  Past Medical History:  Diagnosis Date   Abnormal PSA 01/12/2017   BCC (basal cell carcinoma), face 01/12/2017   BPH (benign prostatic hyperplasia) 04/15/2014   Cataract 07/09/2015   Hematuria 07/09/2015   Medicare annual wellness visit, subsequent 10/21/2014   Other and unspecified hyperlipidemia 04/15/2014   SCC (squamous cell carcinoma) 10/21/2014   Tick bite 2007   Never assessed by a physician.   Unspecified constipation 03/08/2013    No past surgical history on file.  Family History  Problem Relation Age of Onset   Arthritis Mother        rheumatoid   Heart disease Mother        rheumatic fever   Hypertension Mother    Heart disease Father        MI   Dementia Father    Hypertension Sister    Dementia Brother    Hypertension Son    Obesity Son    Aneurysm Maternal Grandmother    Dementia Maternal Grandfather    Heart disease Paternal Grandmother        MI   Kidney disease Paternal Grandfather        bright's disease    Social History   Socioeconomic History   Marital status: Widowed    Spouse name: Not on file   Number  of children: Not on file   Years of education: Not on file   Highest education level: Not on file  Occupational History   Not on file  Tobacco Use   Smoking status: Former   Smokeless tobacco: Never  Vaping Use   Vaping Use: Never used  Substance and Sexual Activity   Alcohol use: Not Currently   Drug use: No   Sexual activity: Not on file  Other Topics Concern   Not on file  Social History Narrative   Not on file   Social Determinants of Health   Financial Resource Strain: Not on file  Food Insecurity: Not on file  Transportation Needs: Not on file  Physical Activity: Not on file  Stress: Not on file  Social Connections: Not on file  Intimate Partner Violence: Not on file    Outpatient Medications Prior to Visit  Medication Sig Dispense Refill   Ascorbic Acid (VITAMIN C) POWD Take 333 mg by mouth daily. Emergen-C-take 1/3 packet daily.     B Complex-C (SUPER B COMPLEX PO) Take by mouth daily.     Camphor-Menthol-Methyl Sal (TIGER BALM MUSCLE RUB EX) Apply topically at bedtime.     Cholecalciferol (VITAMIN D-3) 1000 UNITS CAPS Take by mouth daily.     No facility-administered medications prior  to visit.    Allergies  Allergen Reactions   Yellow Jacket Venom [Bee Venom] Hives    Review of Systems  Constitutional:  Negative for chills and fever.  Respiratory:  Negative for shortness of breath.   Cardiovascular:  Negative for chest pain and palpitations.  Gastrointestinal:  Negative for abdominal pain, blood in stool, constipation, diarrhea, nausea and vomiting.  Genitourinary:  Negative for dysuria, frequency, hematuria and urgency.  Skin:           Neurological:  Negative for headaches.       Objective:    Physical Exam Constitutional:      General: He is not in acute distress.    Appearance: Normal appearance. He is not ill-appearing.  HENT:     Head: Normocephalic and atraumatic.     Right Ear: Tympanic membrane, ear canal and external ear normal.      Left Ear: Tympanic membrane, ear canal and external ear normal.     Nose: Nose normal.     Mouth/Throat:     Mouth: Mucous membranes are moist.     Pharynx: Oropharynx is clear.  Eyes:     General:        Right eye: No discharge.        Left eye: No discharge.     Extraocular Movements: Extraocular movements intact.     Right eye: No nystagmus.     Left eye: No nystagmus.     Pupils: Pupils are equal, round, and reactive to light.  Neck:     Vascular: No carotid bruit.  Cardiovascular:     Rate and Rhythm: Normal rate and regular rhythm.     Pulses: Normal pulses.     Heart sounds: Normal heart sounds. No murmur heard.    No gallop.  Pulmonary:     Effort: Pulmonary effort is normal. No respiratory distress.     Breath sounds: Normal breath sounds. No wheezing or rales.  Abdominal:     General: Bowel sounds are normal.     Palpations: Abdomen is soft.     Tenderness: There is no abdominal tenderness. There is no guarding.  Musculoskeletal:        General: Normal range of motion.     Cervical back: Normal range of motion.     Right lower leg: No edema.     Left lower leg: No edema.     Comments: Muscle strength 5/5 on upper and lower extremities.   Lymphadenopathy:     Cervical: No cervical adenopathy.  Skin:    General: Skin is warm and dry.  Neurological:     Mental Status: He is alert and oriented to person, place, and time.     Sensory: Sensation is intact.     Motor: Motor function is intact.     Coordination: Coordination is intact.     Deep Tendon Reflexes:     Reflex Scores:      Patellar reflexes are 2+ on the right side and 2+ on the left side. Psychiatric:        Mood and Affect: Mood normal.        Behavior: Behavior normal.        Judgment: Judgment normal.     There were no vitals taken for this visit. Wt Readings from Last 3 Encounters:  10/01/22 129 lb 9.6 oz (58.8 kg)  06/30/22 129 lb (58.5 kg)  02/24/22 129 lb 6.4 oz (58.7 kg)    Diabetic  Foot  Exam - Simple   No data filed    Lab Results  Component Value Date   WBC 6.4 10/26/2022   HGB 13.4 10/26/2022   HCT 39.9 10/26/2022   PLT 223.0 10/26/2022   GLUCOSE 86 10/01/2022   CHOL 170 10/01/2022   TRIG 72.0 10/01/2022   HDL 55.70 10/01/2022   LDLCALC 100 (H) 10/01/2022   ALT 28 10/01/2022   AST 36 10/01/2022   NA 138 10/01/2022   K 4.0 10/01/2022   CL 103 10/01/2022   CREATININE 0.66 10/01/2022   BUN 25 (H) 10/01/2022   CO2 29 10/01/2022   TSH 1.29 10/01/2022   PSA 13.90 12/02/2021   HGBA1C 5.5 10/01/2022    Lab Results  Component Value Date   TSH 1.29 10/01/2022   Lab Results  Component Value Date   WBC 6.4 10/26/2022   HGB 13.4 10/26/2022   HCT 39.9 10/26/2022   MCV 94.1 10/26/2022   PLT 223.0 10/26/2022   Lab Results  Component Value Date   NA 138 10/01/2022   K 4.0 10/01/2022   CO2 29 10/01/2022   GLUCOSE 86 10/01/2022   BUN 25 (H) 10/01/2022   CREATININE 0.66 10/01/2022   BILITOT 0.8 10/01/2022   ALKPHOS 50 10/01/2022   AST 36 10/01/2022   ALT 28 10/01/2022   PROT 6.9 10/01/2022   ALBUMIN 4.5 10/01/2022   CALCIUM 9.5 10/01/2022   GFR 86.62 10/01/2022   Lab Results  Component Value Date   CHOL 170 10/01/2022   Lab Results  Component Value Date   HDL 55.70 10/01/2022   Lab Results  Component Value Date   LDLCALC 100 (H) 10/01/2022   Lab Results  Component Value Date   TRIG 72.0 10/01/2022   Lab Results  Component Value Date   CHOLHDL 3 10/01/2022   Lab Results  Component Value Date   HGBA1C 5.5 10/01/2022      Assessment & Plan:  PSA: Last checked on 12/02/2021 at 13.9 ng/mL.   Advanced Directives: Encouraged patient to completed advanced care planning documents.   Healthy Lifestyle: Encouraged 6-8 hours of sleep, heart healthy diet, 60-80 oz of non-alcohol/non-caffeinated fluids, and minimum of 4000 steps daily.  Immunizations: Encouraged patient to consider annual COVID-19 and Influenza vaccinations as well as  RSV, Shingles, and Tetanus vaccinations.   Labs: Routine blood work ordered today.  Supplements Encouraged patient to take vitamin D 1000-2000 IU daily.  Problem List Items Addressed This Visit       Cardiovascular and Mediastinum   HTN (hypertension) - Primary     Other   Weight loss   Hyperlipidemia   Preventative health care   Hyperglycemia   No orders of the defined types were placed in this encounter.  I, Vickey Sages, personally preformed the services described in this documentation.  All medical record entries made by the scribe were at my direction and in my presence.  I have reviewed the chart and discharge instructions (if applicable) and agree that the record reflects my personal performance and is accurate and complete. 04/06/2023  I,Mohammed Iqbal,acting as a scribe for Danise Edge, MD.,have documented all relevant documentation on the behalf of Danise Edge, MD,as directed by  Danise Edge, MD while in the presence of Danise Edge, MD.  Vickey Sages

## 2023-04-06 NOTE — Patient Instructions (Addendum)
Vitamin D3 take 1000 to 2000 IU daily Multivitamin with minerals daily   Tetanus booster RSV, Respiratory Syncitial Virus vaccine, Arexvy vaccine at pharmacy    Drink 60 to 80 ounces of fluids daily, drink extra water if drinking caffeine  Preventive Care 65 Years and Older, Male Preventive care refers to lifestyle choices and visits with your health care provider that can promote health and wellness. Preventive care visits are also called wellness exams. What can I expect for my preventive care visit? Counseling During your preventive care visit, your health care provider may ask about your: Medical history, including: Past medical problems. Family medical history. History of falls. Current health, including: Emotional well-being. Home life and relationship well-being. Sexual activity. Memory and ability to understand (cognition). Lifestyle, including: Alcohol, nicotine or tobacco, and drug use. Access to firearms. Diet, exercise, and sleep habits. Work and work Astronomer. Sunscreen use. Safety issues such as seatbelt and bike helmet use. Physical exam Your health care provider will check your: Height and weight. These may be used to calculate your BMI (body mass index). BMI is a measurement that tells if you are at a healthy weight. Waist circumference. This measures the distance around your waistline. This measurement also tells if you are at a healthy weight and may help predict your risk of certain diseases, such as type 2 diabetes and high blood pressure. Heart rate and blood pressure. Body temperature. Skin for abnormal spots. What immunizations do I need?  Vaccines are usually given at various ages, according to a schedule. Your health care provider will recommend vaccines for you based on your age, medical history, and lifestyle or other factors, such as travel or where you work. What tests do I need? Screening Your health care provider may recommend screening  tests for certain conditions. This may include: Lipid and cholesterol levels. Diabetes screening. This is done by checking your blood sugar (glucose) after you have not eaten for a while (fasting). Hepatitis C test. Hepatitis B test. HIV (human immunodeficiency virus) test. STI (sexually transmitted infection) testing, if you are at risk. Lung cancer screening. Colorectal cancer screening. Prostate cancer screening. Abdominal aortic aneurysm (AAA) screening. You may need this if you are a current or former smoker. Talk with your health care provider about your test results, treatment options, and if necessary, the need for more tests. Follow these instructions at home: Eating and drinking  Eat a diet that includes fresh fruits and vegetables, whole grains, lean protein, and low-fat dairy products. Limit your intake of foods with high amounts of sugar, saturated fats, and salt. Take vitamin and mineral supplements as recommended by your health care provider. Do not drink alcohol if your health care provider tells you not to drink. If you drink alcohol: Limit how much you have to 0-2 drinks a day. Know how much alcohol is in your drink. In the [place] one drink equals one 12 oz bottle of beer (355 mL), one 5 oz glass of wine (148 mL), or one 1 oz glass of hard liquor (44 mL). Lifestyle Brush your teeth every morning and night with fluoride toothpaste. Floss one time each day. Exercise for at least 30 minutes 5 or more days each week. Do not use any products that contain nicotine or tobacco. These products include cigarettes, chewing tobacco, and vaping devices, such as e-cigarettes. If you need help quitting, ask your health care provider. Do not use drugs. If you are sexually active, practice safe sex. Use a condom or other  form of protection to prevent STIs. Take aspirin only as told by your health care provider. Make sure that you understand how much to take and what form to take. Work  with your health care provider to find out whether it is safe and beneficial for you to take aspirin daily. Ask your health care provider if you need to take a cholesterol-lowering medicine (statin). Find healthy ways to manage stress, such as: Meditation, yoga, or listening to music. Journaling. Talking to a trusted person. Spending time with friends and family. Safety Always wear your seat belt while driving or riding in a vehicle. Do not drive: If you have been drinking alcohol. Do not ride with someone who has been drinking. When you are tired or distracted. While texting. If you have been using any mind-altering substances or drugs. Wear a helmet and other protective equipment during sports activities. If you have firearms in your house, make sure you follow all gun safety procedures. Minimize exposure to UV radiation to reduce your risk of skin cancer. What's next? Visit your health care provider once a year for an annual wellness visit. Ask your health care provider how often you should have your eyes and teeth checked. Stay up to date on all vaccines. This information is not intended to replace advice given to you by your health care provider. Make sure you discuss any questions you have with your health care provider. Document Revised: 05/21/2021 Document Reviewed: 05/21/2021 Elsevier Patient Education  2023 [employer].

## 2023-04-06 NOTE — Assessment & Plan Note (Signed)
Well controlled, no changes to meds. Encouraged heart healthy diet such as the DASH diet and exercise as tolerated.  °

## 2023-04-06 NOTE — Assessment & Plan Note (Signed)
Encourage heart healthy diet such as MIND or DASH diet, increase exercise, avoid trans fats, simple carbohydrates and processed foods, consider a krill or fish or flaxseed oil cap daily.  °

## 2023-04-06 NOTE — Assessment & Plan Note (Signed)
Is eating well and weight is

## 2023-04-06 NOTE — Assessment & Plan Note (Signed)
hgba1c acceptable, minimize simple carbs. Increase exercise as tolerated. Continue current meds 

## 2023-04-07 ENCOUNTER — Encounter: Payer: Self-pay | Admitting: Family Medicine

## 2023-04-07 LAB — LIPID PANEL
Cholesterol: 182 mg/dL (ref 0–200)
HDL: 57.6 mg/dL (ref >39.00)
LDL Cholesterol: 110 mg/dL — ABNORMAL HIGH (ref 0–99)
NonHDL: 124.34
Total CHOL/HDL Ratio: 3
Triglycerides: 71 mg/dL (ref 0.0–149.0)
VLDL: 14.2 mg/dL (ref 0.0–40.0)

## 2023-04-07 LAB — COMPREHENSIVE METABOLIC PANEL
ALT: 23 U/L (ref 0–53)
AST: 32 U/L (ref 0–37)
Albumin: 4.3 g/dL (ref 3.5–5.2)
Alkaline Phosphatase: 47 U/L (ref 39–117)
BUN: 23 mg/dL (ref 6–23)
CO2: 27 mEq/L (ref 19–32)
Calcium: 9.6 mg/dL (ref 8.4–10.5)
Chloride: 104 mEq/L (ref 96–112)
Creatinine, Ser: 0.77 mg/dL (ref 0.40–1.50)
GFR: 82.38 mL/min (ref >60.00)
Glucose, Bld: 84 mg/dL (ref 70–99)
Potassium: 4.1 mEq/L (ref 3.5–5.1)
Sodium: 140 mEq/L (ref 135–145)
Total Bilirubin: 0.9 mg/dL (ref 0.2–1.2)
Total Protein: 6.6 g/dL (ref 6.0–8.3)

## 2023-04-07 LAB — TSH: TSH: 1.38 u[IU]/mL (ref 0.35–5.50)

## 2023-04-07 LAB — CBC WITH DIFFERENTIAL/PLATELET
Basophils Absolute: 0.1 10*3/uL (ref 0.0–0.1)
Basophils Relative: 1.2 % (ref 0.0–3.0)
Eosinophils Absolute: 0.2 10*3/uL (ref 0.0–0.7)
Eosinophils Relative: 4.2 % (ref 0.0–5.0)
HCT: 38.2 % — ABNORMAL LOW (ref 39.0–52.0)
Hemoglobin: 13 g/dL (ref 13.0–17.0)
Lymphocytes Relative: 20.2 % (ref 12.0–46.0)
Lymphs Abs: 1.1 10*3/uL (ref 0.7–4.0)
MCHC: 34 g/dL (ref 30.0–36.0)
MCV: 93.1 fl (ref 78.0–100.0)
Monocytes Absolute: 0.4 10*3/uL (ref 0.1–1.0)
Monocytes Relative: 7.5 % (ref 3.0–12.0)
Neutro Abs: 3.6 10*3/uL (ref 1.4–7.7)
Neutrophils Relative %: 66.9 % (ref 43.0–77.0)
Platelets: 199 10*3/uL (ref 150.0–400.0)
RBC: 4.11 Mil/uL — ABNORMAL LOW (ref 4.22–5.81)
RDW: 13.3 % (ref 11.5–15.5)
WBC: 5.4 10*3/uL (ref 4.0–10.5)

## 2023-04-07 LAB — HEMOGLOBIN A1C: Hgb A1c MFr Bld: 5.5 % (ref 4.6–6.5)

## 2023-04-08 ENCOUNTER — Other Ambulatory Visit (HOSPITAL_BASED_OUTPATIENT_CLINIC_OR_DEPARTMENT_OTHER): Payer: Self-pay

## 2023-04-08 MED ORDER — BOOSTRIX 5-2.5-18.5 LF-MCG/0.5 IM SUSY
0.5000 mL | PREFILLED_SYRINGE | Freq: Once | INTRAMUSCULAR | 0 refills | Status: AC
  Filled 2023-04-08: qty 0.5, 1d supply, fill #0

## 2023-09-28 DIAGNOSIS — L720 Epidermal cyst: Secondary | ICD-10-CM | POA: Diagnosis not present

## 2023-09-28 DIAGNOSIS — Z85828 Personal history of other malignant neoplasm of skin: Secondary | ICD-10-CM | POA: Diagnosis not present

## 2023-09-28 DIAGNOSIS — L570 Actinic keratosis: Secondary | ICD-10-CM | POA: Diagnosis not present

## 2023-09-28 DIAGNOSIS — Z129 Encounter for screening for malignant neoplasm, site unspecified: Secondary | ICD-10-CM | POA: Diagnosis not present

## 2023-09-28 DIAGNOSIS — D225 Melanocytic nevi of trunk: Secondary | ICD-10-CM | POA: Diagnosis not present

## 2023-09-28 DIAGNOSIS — Z859 Personal history of malignant neoplasm, unspecified: Secondary | ICD-10-CM | POA: Diagnosis not present

## 2023-09-28 DIAGNOSIS — L820 Inflamed seborrheic keratosis: Secondary | ICD-10-CM | POA: Diagnosis not present

## 2023-09-28 DIAGNOSIS — L821 Other seborrheic keratosis: Secondary | ICD-10-CM | POA: Diagnosis not present

## 2023-10-07 ENCOUNTER — Ambulatory Visit: Payer: Medicare HMO | Admitting: Family Medicine

## 2023-11-17 DIAGNOSIS — N402 Nodular prostate without lower urinary tract symptoms: Secondary | ICD-10-CM | POA: Diagnosis not present

## 2023-11-17 DIAGNOSIS — N401 Enlarged prostate with lower urinary tract symptoms: Secondary | ICD-10-CM | POA: Diagnosis not present

## 2023-11-17 DIAGNOSIS — R972 Elevated prostate specific antigen [PSA]: Secondary | ICD-10-CM | POA: Diagnosis not present

## 2023-11-17 DIAGNOSIS — R3912 Poor urinary stream: Secondary | ICD-10-CM | POA: Diagnosis not present

## 2023-11-24 ENCOUNTER — Other Ambulatory Visit (HOSPITAL_COMMUNITY): Payer: Self-pay | Admitting: Urology

## 2023-11-24 DIAGNOSIS — R972 Elevated prostate specific antigen [PSA]: Secondary | ICD-10-CM

## 2023-12-21 ENCOUNTER — Ambulatory Visit (HOSPITAL_COMMUNITY): Admission: RE | Admit: 2023-12-21 | Payer: Medicare HMO | Source: Ambulatory Visit

## 2023-12-21 ENCOUNTER — Encounter (HOSPITAL_COMMUNITY): Payer: Self-pay

## 2023-12-21 ENCOUNTER — Encounter (HOSPITAL_COMMUNITY): Payer: Medicare HMO

## 2024-04-02 NOTE — Assessment & Plan Note (Deleted)
 hgba1c acceptable, minimize simple carbs. Increase exercise as tolerated. Continue current meds

## 2024-04-02 NOTE — Assessment & Plan Note (Deleted)
 Well controlled, no changes to meds. Encouraged heart healthy diet such as the DASH diet and exercise as tolerated.

## 2024-04-02 NOTE — Assessment & Plan Note (Deleted)
Patient encouraged to maintain heart healthy diet, regular exercise, adequate sleep. Consider daily probiotics. Take medications as prescribed. Labs ordered and reviewed. Given and reviewed copy of ACP documents from [employer] and encouraged to complete and return  Aged out of screening colonoscopy

## 2024-04-02 NOTE — Assessment & Plan Note (Deleted)
 Encourage heart healthy diet such as MIND or DASH diet, increase exercise, avoid trans fats, simple carbohydrates and processed foods, consider a krill or fish or flaxseed oil cap daily.

## 2024-04-06 ENCOUNTER — Encounter: Payer: Medicare HMO | Admitting: Family Medicine

## 2024-04-06 DIAGNOSIS — Z0000 Encounter for general adult medical examination without abnormal findings: Secondary | ICD-10-CM

## 2024-04-06 DIAGNOSIS — I10 Essential (primary) hypertension: Secondary | ICD-10-CM

## 2024-04-06 DIAGNOSIS — E785 Hyperlipidemia, unspecified: Secondary | ICD-10-CM

## 2024-04-06 DIAGNOSIS — R739 Hyperglycemia, unspecified: Secondary | ICD-10-CM

## 2024-04-07 ENCOUNTER — Telehealth: Payer: Self-pay | Admitting: Family Medicine

## 2024-04-07 NOTE — Telephone Encounter (Signed)
 Copied from CRM [id number]. Topic: Appointments - Scheduling Inquiry for Clinic >> Apr 06, 2024  2:28 PM Trula Gable C wrote: Reason for CRM: Patient called in regarding annual physical , informed him that his physical was schedule for today and he has missed I soonest availability isn't until 11/03, wanted to know if he could be seen sooner , would like a callback regarding this   [phone] >> Apr 06, 2024  2:44 PM Juleen Oakland F wrote: Patient returning call regarding this, I don't see any documentation of the office giving him a call. I let him know that we will call him when we hear back about a sooner appt

## 2024-04-07 NOTE — Telephone Encounter (Signed)
 E2c2 scheduled this appt in November incorrectly. This is not  WTM, he is 83 and not new to medicare. I lvm to r/s as he is currently booked until March for cpes.

## 2024-09-26 DIAGNOSIS — Z85828 Personal history of other malignant neoplasm of skin: Secondary | ICD-10-CM | POA: Diagnosis not present

## 2024-09-26 DIAGNOSIS — C4441 Basal cell carcinoma of skin of scalp and neck: Secondary | ICD-10-CM | POA: Diagnosis not present

## 2024-09-26 DIAGNOSIS — Z129 Encounter for screening for malignant neoplasm, site unspecified: Secondary | ICD-10-CM | POA: Diagnosis not present

## 2024-09-26 DIAGNOSIS — D485 Neoplasm of uncertain behavior of skin: Secondary | ICD-10-CM | POA: Diagnosis not present

## 2024-09-26 DIAGNOSIS — L821 Other seborrheic keratosis: Secondary | ICD-10-CM | POA: Diagnosis not present

## 2024-09-26 DIAGNOSIS — D225 Melanocytic nevi of trunk: Secondary | ICD-10-CM | POA: Diagnosis not present

## 2024-10-08 NOTE — Assessment & Plan Note (Deleted)
 hgba1c acceptable, minimize simple carbs. Increase exercise as tolerated. Continue current meds

## 2024-10-08 NOTE — Assessment & Plan Note (Deleted)
 Well controlled, no changes to meds. Encouraged heart healthy diet such as the DASH diet and exercise as tolerated.

## 2024-10-08 NOTE — Assessment & Plan Note (Deleted)
 Following with urology

## 2024-10-08 NOTE — Assessment & Plan Note (Deleted)
 Encourage heart healthy diet such as MIND or DASH diet, increase exercise, avoid trans fats, simple carbohydrates and processed foods, consider a krill or fish or flaxseed oil cap daily.

## 2024-10-08 NOTE — Progress Notes (Deleted)
 Subjective:    Patient ID: Troy Mccann, male    DOB: 1941-10-13, 83 y.o.   MRN: [id number]  No chief complaint on file.   HPI Discussed the use of AI scribe software for clinical note transcription with the patient, who gave verbal consent to proceed.  History of Present Illness     Past Medical History:  Diagnosis Date  . Abnormal PSA 01/12/2017  . BCC (basal cell carcinoma), face 01/12/2017  . BPH (benign prostatic hyperplasia) 04/15/2014  . Cataract 07/09/2015  . Hematuria 07/09/2015  . Medicare annual wellness visit, subsequent 10/21/2014  . Other and unspecified hyperlipidemia 04/15/2014  . SCC (squamous cell carcinoma) 10/21/2014  . Tick bite 2007   Never assessed by a physician.  SABRA Unspecified constipation 03/08/2013    No past surgical history on file.  Family History  Problem Relation Age of Onset  . Arthritis Mother        rheumatoid  . Heart disease Mother        rheumatic fever  . Hypertension Mother   . Heart disease Father        MI  . Dementia Father   . Hypertension Sister   . Dementia Brother   . Hypertension Son   . Obesity Son   . Aneurysm Maternal Grandmother   . Dementia Maternal Grandfather   . Heart disease Paternal Grandmother        MI  . Kidney disease Paternal Grandfather        bright's disease    Social History   Socioeconomic History  . Marital status: Widowed    Spouse name: Not on file  . Number of children: Not on file  . Years of education: Not on file  . Highest education level: Not on file  Occupational History  . Not on file  Tobacco Use  . Smoking status: Former  . Smokeless tobacco: Never  Vaping Use  . Vaping status: Never Used  Substance and Sexual Activity  . Alcohol use: Not Currently  . Drug use: No  . Sexual activity: Not on file  Other Topics Concern  . Not on file  Social History Narrative  . Not on file   Social Drivers of Health   Financial Resource Strain: Not on file  Food Insecurity: Not on  file  Transportation Needs: Not on file  Physical Activity: Not on file  Stress: Not on file  Social Connections: Not on file  Intimate Partner Violence: Not on file    Outpatient Medications Prior to Visit  Medication Sig Dispense Refill  . Ascorbic Acid (VITAMIN C ) POWD Take 333 mg by mouth daily. Emergen-C-take 1/3 packet daily.    . B Complex-C (SUPER B COMPLEX PO) Take by mouth daily.    . Camphor-Menthol-Methyl Sal (TIGER BALM MUSCLE RUB EX) Apply topically at bedtime.    . Cholecalciferol (VITAMIN D-3) 1000 UNITS CAPS Take by mouth daily.     No facility-administered medications prior to visit.    Allergies  Allergen Reactions  . Yellow Jacket Venom [Bee Venom] Hives    Review of Systems  Constitutional:  Negative for fever and malaise/fatigue.  HENT:  Negative for congestion.   Eyes:  Negative for blurred vision.  Respiratory:  Negative for shortness of breath.   Cardiovascular:  Negative for chest pain, palpitations and leg swelling.  Gastrointestinal:  Negative for abdominal pain, blood in stool and nausea.  Genitourinary:  Negative for dysuria and frequency.  Musculoskeletal:  Negative for  falls.  Skin:  Negative for rash.  Neurological:  Negative for dizziness, loss of consciousness and headaches.  Endo/Heme/Allergies:  Negative for environmental allergies.  Psychiatric/Behavioral:  Negative for depression. The patient is not nervous/anxious.        Objective:    Physical Exam Vitals reviewed.  Constitutional:      Appearance: Normal appearance. He is not ill-appearing.  HENT:     Head: Normocephalic and atraumatic.     Nose: Nose normal.  Eyes:     Conjunctiva/sclera: Conjunctivae normal.  Cardiovascular:     Rate and Rhythm: Normal rate.     Pulses: Normal pulses.     Heart sounds: Normal heart sounds. No murmur heard. Pulmonary:     Effort: Pulmonary effort is normal.     Breath sounds: Normal breath sounds. No wheezing.  Abdominal:      Palpations: Abdomen is soft. There is no mass.     Tenderness: There is no abdominal tenderness.  Musculoskeletal:     Cervical back: Normal range of motion.     Right lower leg: No edema.     Left lower leg: No edema.  Skin:    General: Skin is warm and dry.  Neurological:     General: No focal deficit present.     Mental Status: He is alert and oriented to person, place, and time.  Psychiatric:        Mood and Affect: Mood normal.    There were no vitals taken for this visit. Wt Readings from Last 3 Encounters:  04/06/23 126 lb (57.2 kg)  10/01/22 129 lb 9.6 oz (58.8 kg)  06/30/22 129 lb (58.5 kg)    Diabetic Foot Exam - Simple   No data filed    Lab Results  Component Value Date   WBC 5.4 04/06/2023   HGB 13.0 04/06/2023   HCT 38.2 (L) 04/06/2023   PLT 199.0 04/06/2023   GLUCOSE 84 04/06/2023   CHOL 182 04/06/2023   TRIG 71.0 04/06/2023   HDL 57.60 04/06/2023   LDLCALC 110 (H) 04/06/2023   ALT 23 04/06/2023   AST 32 04/06/2023   NA 140 04/06/2023   K 4.1 04/06/2023   CL 104 04/06/2023   CREATININE 0.77 04/06/2023   BUN 23 04/06/2023   CO2 27 04/06/2023   TSH 1.38 04/06/2023   PSA 13.90 12/02/2021   HGBA1C 5.5 04/06/2023    Lab Results  Component Value Date   TSH 1.38 04/06/2023   Lab Results  Component Value Date   WBC 5.4 04/06/2023   HGB 13.0 04/06/2023   HCT 38.2 (L) 04/06/2023   MCV 93.1 04/06/2023   PLT 199.0 04/06/2023   Lab Results  Component Value Date   NA 140 04/06/2023   K 4.1 04/06/2023   CO2 27 04/06/2023   GLUCOSE 84 04/06/2023   BUN 23 04/06/2023   CREATININE 0.77 04/06/2023   BILITOT 0.9 04/06/2023   ALKPHOS 47 04/06/2023   AST 32 04/06/2023   ALT 23 04/06/2023   PROT 6.6 04/06/2023   ALBUMIN 4.3 04/06/2023   CALCIUM 9.6 04/06/2023   GFR 82.38 04/06/2023   Lab Results  Component Value Date   CHOL 182 04/06/2023   Lab Results  Component Value Date   HDL 57.60 04/06/2023   Lab Results  Component Value Date    LDLCALC 110 (H) 04/06/2023   Lab Results  Component Value Date   TRIG 71.0 04/06/2023   Lab Results  Component Value Date  CHOLHDL 3 04/06/2023   Lab Results  Component Value Date   HGBA1C 5.5 04/06/2023       Assessment & Plan:  Primary hypertension Assessment & Plan: Well controlled, no changes to meds. Encouraged heart healthy diet such as the DASH diet and exercise as tolerated.    Hyperglycemia Assessment & Plan: hgba1c acceptable, minimize simple carbs. Increase exercise as tolerated. Continue current meds    Hyperlipidemia, unspecified hyperlipidemia type Assessment & Plan: Encourage heart healthy diet such as MIND or DASH diet, increase exercise, avoid trans fats, simple carbohydrates and processed foods, consider a krill or fish or flaxseed oil cap daily.    Abnormal PSA Assessment & Plan: Following with urology     Assessment and Plan Assessment & Plan      Harlene Horton, MD

## 2024-10-09 ENCOUNTER — Encounter: Admitting: Family Medicine

## 2024-10-09 DIAGNOSIS — R739 Hyperglycemia, unspecified: Secondary | ICD-10-CM

## 2024-10-09 DIAGNOSIS — I10 Essential (primary) hypertension: Secondary | ICD-10-CM

## 2024-10-09 DIAGNOSIS — R972 Elevated prostate specific antigen [PSA]: Secondary | ICD-10-CM

## 2024-10-09 DIAGNOSIS — E785 Hyperlipidemia, unspecified: Secondary | ICD-10-CM

## 2024-10-26 ENCOUNTER — Telehealth: Payer: Self-pay | Admitting: Family Medicine

## 2024-10-26 NOTE — Telephone Encounter (Signed)
 Copied from CRM #[id number]. Topic: Medicare AWV >> Oct 26, 2024  9:14 AM Nathanel DEL wrote: Called LVM 10/26/2024 to sched AWV. Please schedule in office or virtual visit.   Nathanel Paschal; Care Guide Ambulatory Clinical Support [place] [facility] Direct Dial: [phone]
